# Patient Record
Sex: Male | Born: 1960
Health system: Southern US, Community
[De-identification: ages and names within clinical notes are randomized; demographics above are authoritative.]

## PROBLEM LIST (undated history)

## (undated) DIAGNOSIS — Z95810 Presence of automatic (implantable) cardiac defibrillator: Secondary | ICD-10-CM

## (undated) DIAGNOSIS — E785 Hyperlipidemia, unspecified: Secondary | ICD-10-CM

## (undated) DIAGNOSIS — I513 Intracardiac thrombosis, not elsewhere classified: Secondary | ICD-10-CM

## (undated) DIAGNOSIS — I5022 Chronic systolic (congestive) heart failure: Secondary | ICD-10-CM

## (undated) DIAGNOSIS — I255 Ischemic cardiomyopathy: Secondary | ICD-10-CM

## (undated) DIAGNOSIS — Z7901 Long term (current) use of anticoagulants: Secondary | ICD-10-CM

## (undated) DIAGNOSIS — R7301 Impaired fasting glucose: Secondary | ICD-10-CM

## (undated) DIAGNOSIS — K219 Gastro-esophageal reflux disease without esophagitis: Secondary | ICD-10-CM

## (undated) DIAGNOSIS — I10 Essential (primary) hypertension: Secondary | ICD-10-CM

## (undated) HISTORY — DX: Ischemic cardiomyopathy: I25.5

## (undated) HISTORY — DX: Impaired fasting glucose: R73.01

## (undated) HISTORY — DX: Long term (current) use of anticoagulants: Z79.01

## (undated) HISTORY — DX: Gastro-esophageal reflux disease without esophagitis: K21.9

## (undated) HISTORY — DX: Chronic systolic (congestive) heart failure: I50.22

## (undated) HISTORY — DX: Presence of automatic (implantable) cardiac defibrillator: Z95.810

## (undated) HISTORY — DX: Essential (primary) hypertension: I10

## (undated) HISTORY — DX: Intracardiac thrombosis, not elsewhere classified: I51.3

## (undated) HISTORY — DX: Hyperlipidemia, unspecified: E78.5

---

## 2011-03-01 HISTORY — PX: COLONOSCOPY: SHX174

## 2011-10-11 ENCOUNTER — Emergency Department (HOSPITAL_COMMUNITY): Payer: No Typology Code available for payment source

## 2011-10-11 ENCOUNTER — Emergency Department (HOSPITAL_COMMUNITY): Payer: Self-pay

## 2011-10-11 ENCOUNTER — Emergency Department (HOSPITAL_COMMUNITY)
Admission: EM | Admit: 2011-10-11 | Discharge: 2011-10-11 | Disposition: A | Discharge status: Home / Self Care | Payer: No Typology Code available for payment source | Attending: Emergency Medicine | Admitting: Emergency Medicine

## 2011-10-11 DIAGNOSIS — R079 Chest pain, unspecified: Secondary | ICD-10-CM | POA: Insufficient documentation

## 2011-10-11 DIAGNOSIS — Y92410 Unspecified street and highway as the place of occurrence of the external cause: Secondary | ICD-10-CM | POA: Insufficient documentation

## 2011-10-11 DIAGNOSIS — M542 Cervicalgia: Secondary | ICD-10-CM | POA: Insufficient documentation

## 2011-10-11 DIAGNOSIS — S199XXA Unspecified injury of neck, initial encounter: Secondary | ICD-10-CM

## 2011-10-11 DIAGNOSIS — T07XXXA Unspecified multiple injuries, initial encounter: Secondary | ICD-10-CM

## 2011-10-11 MED ORDER — OXYCODONE-ACETAMINOPHEN 5-325 MG PO TABS: 1.0000 | ORAL_TABLET | ORAL | Status: AC | PRN

## 2011-10-11 MED ORDER — OXYCODONE-ACETAMINOPHEN 5-325 MG PO TABS
1.0000 | ORAL_TABLET | Freq: Once | ORAL | Status: AC
  Administered 2011-10-11: 1 via ORAL
  Filled 2011-10-11: qty 1
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
## 2011-10-11 NOTE — ED Notes (Signed)
Pt ambulates with steady gait.  D/c instructions reviewed, verbalized understanding.  No respiratory distress noted.
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
## 2011-10-11 NOTE — ED Notes (Signed)
Pt requesting pain medication and removal of cervical collar.

## 2012-03-22 LAB — HM COLONOSCOPY

## 2013-02-28 DIAGNOSIS — I10 Essential (primary) hypertension: Secondary | ICD-10-CM

## 2013-02-28 HISTORY — DX: Essential (primary) hypertension: I10

## 2016-12-01 ENCOUNTER — Ambulatory Visit (INDEPENDENT_AMBULATORY_CARE_PROVIDER_SITE_OTHER): Payer: 59 | Admitting: Medical

## 2016-12-01 ENCOUNTER — Encounter: Payer: Self-pay | Admitting: Medical

## 2016-12-01 VITALS — BP 120/76 | HR 101 | Ht 68.0 in | Wt 210.0 lb

## 2016-12-01 DIAGNOSIS — R0602 Shortness of breath: Secondary | ICD-10-CM | POA: Diagnosis not present

## 2016-12-01 DIAGNOSIS — Z2821 Immunization not carried out because of patient refusal: Secondary | ICD-10-CM

## 2016-12-01 DIAGNOSIS — Z6831 Body mass index (BMI) 31.0-31.9, adult: Secondary | ICD-10-CM | POA: Insufficient documentation

## 2016-12-01 DIAGNOSIS — I10 Essential (primary) hypertension: Secondary | ICD-10-CM | POA: Diagnosis not present

## 2016-12-01 DIAGNOSIS — E669 Obesity, unspecified: Secondary | ICD-10-CM | POA: Insufficient documentation

## 2016-12-01 MED ORDER — LOSARTAN POTASSIUM-HCTZ 50-12.5 MG PO TABS: 1.0000 | ORAL_TABLET | Freq: Every day | ORAL | 1 refills | Status: DC

## 2016-12-01 NOTE — Progress Notes (Signed)
Subjective: Chief Complaint  Patient presents with  . New Patient (Initial Visit)    new pt  discuss his b/p    Was seeing a doctor in [place], [place] prior.  Got tired of driving all the way up there.  Lives here in [place] now.  Here to establish care.  Wife is Gaylin Bulthuis who comes here for care.   Been feeling good in general.    Works 6pm - 6am, [employer], is a Surveyor, mining for [employer].     He has hx/o HTN, diagnosed 3 years ago, taking Losartan HTC 50/12.5mg  daily.      Nonsmoker.  Drinks beer, 4 on a weekend.     Exercise - some, wife just signed them up at the gym.   Climbs in an out the truck daily.  Last lab, >74mo ago.  Last EKG was "great" 2 years.   No prior cardiology eval.    Checks BPs, gets 120/70s.  No chest pain,  no swelling.   Gets occasionally SOB.     Past Medical History:  Diagnosis Date  . Hypertension 2015   Current Outpatient Prescriptions on File Prior to Visit  Medication Sig Dispense Refill  . ibuprofen (ADVIL,MOTRIN) 200 MG tablet Take 400 mg by mouth every 6 (six) hours as needed. For pain    . Multiple Vitamin (MULTIVITAMIN WITH MINERALS) TABS Take 1 tablet by mouth daily.     No current facility-administered medications on file prior to visit.    ROS as in subjective   Objective: BP 120/76   Pulse (!) 101   Ht 5\' 8"  (1.727 m)   Wt 210 lb (95.3 kg)   SpO2 98%   BMI 31.93 kg/m   Wt Readings from Last 3 Encounters:  12/01/16 210 lb (95.3 kg)   BP Readings from Last 3 Encounters:  12/01/16 120/76  10/11/11 108/73   General appearance: alert, no distress, WD/WN, pleasant obese AA male Neck: supple, no lymphadenopathy, no thyromegaly, no masses, no bruits Heart: RRR, normal S1, S2, no murmurs Lungs: CTA bilaterally, no wheezes, rhonchi, or rales Abdomen: +bs, soft, non tender, non distended, no masses, no hepatomegaly, no splenomegaly Pulses: 2+ symmetric, upper and lower extremities, normal cap refill Ext: no  edema     Adult ECG Report  Indication: HTN  Rate: 98bpm  Rhythm: normal sinus rhythm and premature ventricular contractions (PVC)  QRS Axis: -81 degrees  PR Interval: 171ms  QRS Duration: 11ms  QTc: 494ms  Conduction Disturbances: left axis deviation  Other Abnormalities: none  Patient's cardiac risk factors are: advanced age (older than 80 for men, 25 for women) and hypertension.  EKG comparison: none  Narrative Interpretation: PVCs, abnormal EKG    Assessment: Encounter Diagnoses  Name Primary?  . Essential hypertension Yes  . Class 1 obesity with serious comorbidity and body mass index (BMI) of 31.0 to 31.9 in adult, unspecified obesity type   . SOB (shortness of breath)   . Influenza vaccine refused     Plan: Reviewed EKG, labs today fasting. C/t same medication for HTN, work on weight loss, healthy diet, routine exercise SOB - may be deconditioning.  reviewed EKG, will get labs.   Flu shot declined  Raymir was seen today for new patient (initial visit).  Diagnoses and all orders for this visit:  Essential hypertension -     Lipid panel -     Comprehensive metabolic panel -     CBC -  EKG 12-Lead  Class 1 obesity with serious comorbidity and body mass index (BMI) of 31.0 to 31.9 in adult, unspecified obesity type  SOB (shortness of breath)  Influenza vaccine refused

## 2016-12-02 ENCOUNTER — Other Ambulatory Visit: Payer: Self-pay | Admitting: Medical

## 2016-12-02 ENCOUNTER — Encounter: Payer: Self-pay | Admitting: Internal Medicine

## 2016-12-02 ENCOUNTER — Telehealth: Payer: Self-pay

## 2016-12-02 LAB — CBC
HEMATOCRIT: 51.2 % — AB (ref 38.5–50.0)
Hemoglobin: 17.4 g/dL — ABNORMAL HIGH (ref 13.2–17.1)
MCH: 28 pg (ref 27.0–33.0)
MCHC: 34 g/dL (ref 32.0–36.0)
MCV: 82.3 fL (ref 80.0–100.0)
MPV: 10.3 fL (ref 7.5–12.5)
PLATELETS: 330 10*3/uL (ref 140–400)
RBC: 6.22 10*6/uL — ABNORMAL HIGH (ref 4.20–5.80)
RDW: 13.6 % (ref 11.0–15.0)
WBC: 7.1 10*3/uL (ref 3.8–10.8)

## 2016-12-02 LAB — LIPID PANEL
CHOLESTEROL: 173 mg/dL (ref <200)
HDL: 38 mg/dL — ABNORMAL LOW (ref >40)
LDL Cholesterol (Calc): 107 mg/dL (calc) — ABNORMAL HIGH
Non-HDL Cholesterol (Calc): 135 mg/dL (calc) — ABNORMAL HIGH (ref <130)
TRIGLYCERIDES: 168 mg/dL — AB (ref <150)
Total CHOL/HDL Ratio: 4.6 (calc) (ref <5.0)

## 2016-12-02 LAB — COMPREHENSIVE METABOLIC PANEL
AG RATIO: 1.1 (calc) (ref 1.0–2.5)
ALBUMIN MSPROF: 4 g/dL (ref 3.6–5.1)
ALT: 34 U/L (ref 9–46)
AST: 25 U/L (ref 10–35)
Alkaline phosphatase (APISO): 51 U/L (ref 40–115)
BILIRUBIN TOTAL: 0.6 mg/dL (ref 0.2–1.2)
BUN: 18 mg/dL (ref 7–25)
CHLORIDE: 104 mmol/L (ref 98–110)
CO2: 27 mmol/L (ref 20–32)
CREATININE: 1.15 mg/dL (ref 0.70–1.33)
Calcium: 9.3 mg/dL (ref 8.6–10.3)
Globulin: 3.8 g/dL (calc) — ABNORMAL HIGH (ref 1.9–3.7)
Glucose, Bld: 113 mg/dL — ABNORMAL HIGH (ref 65–99)
POTASSIUM: 4.1 mmol/L (ref 3.5–5.3)
Sodium: 138 mmol/L (ref 135–146)
Total Protein: 7.8 g/dL (ref 6.1–8.1)

## 2016-12-02 MED ORDER — PRAVASTATIN SODIUM 20 MG PO TABS: 20.0000 mg | ORAL_TABLET | Freq: Every evening | ORAL | 0 refills | Status: DC

## 2016-12-02 NOTE — Telephone Encounter (Signed)
Records from [facility] placed in folder for review./ RLB

## 2017-03-06 ENCOUNTER — Encounter: Payer: Self-pay | Admitting: Medical

## 2017-03-06 ENCOUNTER — Ambulatory Visit: Payer: 59 | Admitting: Medical

## 2017-03-06 VITALS — BP 134/88 | HR 98 | Wt 212.6 lb

## 2017-03-06 DIAGNOSIS — Z125 Encounter for screening for malignant neoplasm of prostate: Secondary | ICD-10-CM

## 2017-03-06 DIAGNOSIS — Z1211 Encounter for screening for malignant neoplasm of colon: Secondary | ICD-10-CM | POA: Insufficient documentation

## 2017-03-06 DIAGNOSIS — R7989 Other specified abnormal findings of blood chemistry: Secondary | ICD-10-CM | POA: Insufficient documentation

## 2017-03-06 DIAGNOSIS — I10 Essential (primary) hypertension: Secondary | ICD-10-CM | POA: Diagnosis not present

## 2017-03-06 DIAGNOSIS — E785 Hyperlipidemia, unspecified: Secondary | ICD-10-CM | POA: Insufficient documentation

## 2017-03-06 DIAGNOSIS — Z1159 Encounter for screening for other viral diseases: Secondary | ICD-10-CM | POA: Insufficient documentation

## 2017-03-06 LAB — COMPREHENSIVE METABOLIC PANEL
AG Ratio: 1.1 (calc) (ref 1.0–2.5)
ALBUMIN MSPROF: 4 g/dL (ref 3.6–5.1)
ALKALINE PHOSPHATASE (APISO): 56 U/L (ref 40–115)
ALT: 34 U/L (ref 9–46)
AST: 21 U/L (ref 10–35)
BILIRUBIN TOTAL: 0.6 mg/dL (ref 0.2–1.2)
BUN: 13 mg/dL (ref 7–25)
CALCIUM: 9.3 mg/dL (ref 8.6–10.3)
CO2: 26 mmol/L (ref 20–32)
Chloride: 104 mmol/L (ref 98–110)
Creat: 1.15 mg/dL (ref 0.70–1.33)
Globulin: 3.7 g/dL (calc) (ref 1.9–3.7)
Glucose, Bld: 100 mg/dL — ABNORMAL HIGH (ref 65–99)
POTASSIUM: 4.2 mmol/L (ref 3.5–5.3)
SODIUM: 139 mmol/L (ref 135–146)
TOTAL PROTEIN: 7.7 g/dL (ref 6.1–8.1)

## 2017-03-06 LAB — CBC WITH DIFFERENTIAL/PLATELET
Basophils Absolute: 52 cells/uL (ref 0–200)
Basophils Relative: 0.8 %
EOS PCT: 3.7 %
Eosinophils Absolute: 241 cells/uL (ref 15–500)
HEMATOCRIT: 48.4 % (ref 38.5–50.0)
HEMOGLOBIN: 17.2 g/dL — AB (ref 13.2–17.1)
LYMPHS ABS: 2717 {cells}/uL (ref 850–3900)
MCH: 28.9 pg (ref 27.0–33.0)
MCHC: 35.5 g/dL (ref 32.0–36.0)
MCV: 81.2 fL (ref 80.0–100.0)
MPV: 10.1 fL (ref 7.5–12.5)
Monocytes Relative: 9.1 %
NEUTROS ABS: 2899 {cells}/uL (ref 1500–7800)
NEUTROS PCT: 44.6 %
Platelets: 288 10*3/uL (ref 140–400)
RBC: 5.96 10*6/uL — AB (ref 4.20–5.80)
RDW: 14.3 % (ref 11.0–15.0)
Total Lymphocyte: 41.8 %
WBC mixed population: 592 cells/uL (ref 200–950)
WBC: 6.5 10*3/uL (ref 3.8–10.8)

## 2017-03-06 LAB — LIPID PANEL
CHOL/HDL RATIO: 4.6 (calc) (ref <5.0)
Cholesterol: 143 mg/dL (ref <200)
HDL: 31 mg/dL — ABNORMAL LOW (ref >40)
LDL Cholesterol (Calc): 85 mg/dL (calc)
Non-HDL Cholesterol (Calc): 112 mg/dL (calc) (ref <130)
Triglycerides: 169 mg/dL — ABNORMAL HIGH (ref <150)

## 2017-03-06 NOTE — Progress Notes (Signed)
   Subjective:   Caleb Thomas is a 57 y.o. male presenting on 03/06/2017 with Follow-up (3 month follow up , no other concerns)  Hypertension-compliant with medication, no complaints.  No chest pain no shortness of breath no edema.  Hyperlipidemia-last visit we started Pravachol which she is taking without complaint.  Exercise 30 min daily.  Physical active on the job too.   Up and down all night long active.  Last prostate check was probably around a year ago.  No other complaint.  Review of Systems ROS as in subjective  Past Medical History:  Diagnosis Date  . Hyperlipidemia   . Hypertension 2015   Current Outpatient Medications on File Prior to Visit  Medication Sig Dispense Refill  . ibuprofen (ADVIL,MOTRIN) 200 MG tablet Take 400 mg by mouth every 6 (six) hours as needed. For pain    . losartan-hydrochlorothiazide (HYZAAR) 50-12.5 MG tablet Take 1 tablet by mouth daily. 90 tablet 1  . Multiple Vitamin (MULTIVITAMIN WITH MINERALS) TABS Take 1 tablet by mouth daily.    . pravastatin (PRAVACHOL) 20 MG tablet Take 1 tablet (20 mg total) by mouth every evening. 90 tablet 0   No current facility-administered medications on file prior to visit.    Family History  Problem Relation Age of Onset  . Stroke Mother   . Heart disease Father   . Hypertension Brother   . Stroke Brother   . Hypertension Brother   . Cancer Neg Hx     ROS as in subjective     Objective:    Objective: BP 134/88   Pulse 98   Wt 212 lb 9.6 oz (96.4 kg)   SpO2 98%   BMI 32.33 kg/m   General appearance: alert, no distress, WD/WN Neck: supple, no lymphadenopathy, no thyromegaly, no masses, no bruits Heart: RRR, normal S1, S2, no murmurs Lungs: CTA bilaterally, no wheezes, rhonchi, or rales Abdomen: +bs, soft, non tender, non distended, no masses, no hepatomegaly, no splenomegaly Pulses: 2+ symmetric, upper and lower extremities, normal cap refill Ext: no edema     Assessment: Encounter  Diagnoses  Name Primary?  . Essential hypertension Yes  . Hyperlipidemia, unspecified hyperlipidemia type   . Abnormal CBC   . Screen for colon cancer   . Screening for prostate cancer      Plan: HTN -continue same medication Hyperlipidemia-fasting labs today, continue Pravachol started last visit Abnormal CBC-repeat labs today from findings last visit I will have him do stool cards.  I reviewed his colonoscopy from 2014 We will plan to check prostate hopefully next visit, physical visit  Caleb Thomas was seen today for follow-up.  Diagnoses and all orders for this visit:  Essential hypertension -     Lipid panel -     Comprehensive metabolic panel  Hyperlipidemia, unspecified hyperlipidemia type -     Lipid panel -     Comprehensive metabolic panel  Abnormal CBC -     CBC with Differential/Platelet  Screen for colon cancer  Screening for prostate cancer   Return pending labs.

## 2017-03-07 ENCOUNTER — Other Ambulatory Visit: Payer: Self-pay | Admitting: Medical

## 2017-03-07 MED ORDER — PRAVASTATIN SODIUM 20 MG PO TABS: 20.0000 mg | ORAL_TABLET | Freq: Every evening | ORAL | 3 refills | Status: DC

## 2017-03-07 MED ORDER — LOSARTAN POTASSIUM-HCTZ 50-12.5 MG PO TABS: 1.0000 | ORAL_TABLET | Freq: Every day | ORAL | 1 refills | Status: DC

## 2017-03-13 ENCOUNTER — Other Ambulatory Visit (INDEPENDENT_AMBULATORY_CARE_PROVIDER_SITE_OTHER): Payer: 59

## 2017-03-13 DIAGNOSIS — Z0000 Encounter for general adult medical examination without abnormal findings: Secondary | ICD-10-CM

## 2017-03-13 LAB — POC HEMOCCULT BLD/STL (HOME/3-CARD/SCREEN)
Card #2 Fecal Occult Blod, POC: NEGATIVE
FECAL OCCULT BLD: NEGATIVE
FECAL OCCULT BLD: NEGATIVE

## 2017-04-24 ENCOUNTER — Telehealth: Payer: Self-pay | Admitting: Family Medicine

## 2017-04-24 NOTE — Telephone Encounter (Signed)
Pt called for "Generic" Cialis to be called to [facility] on [address].

## 2017-04-24 NOTE — Telephone Encounter (Signed)
I don't think we have discussed this, and I'm not aware that he has been on this kind of medication prior.  Please have him f/u to discuss

## 2017-04-25 NOTE — Telephone Encounter (Signed)
Called patient and advised same.  He will call back and schedule this along with his 4 mo cpe.

## 2017-05-31 ENCOUNTER — Telehealth: Payer: Self-pay | Admitting: Medical

## 2017-05-31 NOTE — Telephone Encounter (Signed)
Called patient and he advised that he had already contacted pharmacy and they advised him his medication was not part of the effected recall.

## 2017-05-31 NOTE — Telephone Encounter (Signed)
I received notice from their insurer or pharmacy notifying of a recall on certain lots of Losartan blood pressure medication   Have them call pharmacy to check to see if the Losartan medication is one on the recall list   Not all versions of Losartan affected.  Make appt to discuss if his is on the affected list

## 2017-07-20 ENCOUNTER — Ambulatory Visit (INDEPENDENT_AMBULATORY_CARE_PROVIDER_SITE_OTHER): Payer: 59 | Admitting: Medical

## 2017-07-20 ENCOUNTER — Encounter: Payer: Self-pay | Admitting: Medical

## 2017-07-20 VITALS — BP 130/82 | HR 95 | Temp 97.7°F | Ht 68.0 in | Wt 208.6 lb

## 2017-07-20 DIAGNOSIS — Z23 Encounter for immunization: Secondary | ICD-10-CM | POA: Insufficient documentation

## 2017-07-20 DIAGNOSIS — Z1159 Encounter for screening for other viral diseases: Secondary | ICD-10-CM

## 2017-07-20 DIAGNOSIS — I10 Essential (primary) hypertension: Secondary | ICD-10-CM | POA: Diagnosis not present

## 2017-07-20 DIAGNOSIS — Z6831 Body mass index (BMI) 31.0-31.9, adult: Secondary | ICD-10-CM

## 2017-07-20 DIAGNOSIS — N529 Male erectile dysfunction, unspecified: Secondary | ICD-10-CM | POA: Diagnosis not present

## 2017-07-20 DIAGNOSIS — Z125 Encounter for screening for malignant neoplasm of prostate: Secondary | ICD-10-CM

## 2017-07-20 DIAGNOSIS — Z0000 Encounter for general adult medical examination without abnormal findings: Secondary | ICD-10-CM | POA: Diagnosis not present

## 2017-07-20 DIAGNOSIS — E669 Obesity, unspecified: Secondary | ICD-10-CM | POA: Diagnosis not present

## 2017-07-20 DIAGNOSIS — Z1211 Encounter for screening for malignant neoplasm of colon: Secondary | ICD-10-CM

## 2017-07-20 DIAGNOSIS — E785 Hyperlipidemia, unspecified: Secondary | ICD-10-CM

## 2017-07-20 MED ORDER — SILDENAFIL CITRATE 100 MG PO TABS: 100.0000 mg | ORAL_TABLET | Freq: Every day | ORAL | 2 refills | Status: DC | PRN

## 2017-07-20 NOTE — Progress Notes (Signed)
Subjective:   HPI  Caleb Thomas is a 57 y.o. male who presents for Chief Complaint  Patient presents with  . Annual Exam    Medical care team includes: Tysinger, Camelia Eng, PA-C here for primary care Dentist Eye doctor  Concerns: Wants generic Viagra that insurance will pay for.  Has used this prior.   Wants to use 19m, 1/2 tablet prn.   Reviewed their medical, surgical, family, social, medication, and allergy history and updated chart as appropriate.  Past Medical History:  Diagnosis Date  . Hyperlipidemia   . Hypertension 2015    Social History   Socioeconomic History  . Marital status: Married    Spouse name: Not on file  . Number of children: Not on file  . Years of education: Not on file  . Highest education level: Not on file  Occupational History  . Not on file  Social Needs  . Financial resource strain: Not on file  . Food insecurity:    Worry: Not on file    Inability: Not on file  . Transportation needs:    Medical: Not on file    Non-medical: Not on file  Tobacco Use  . Smoking status: Former SResearch scientist (life sciences) . Smokeless tobacco: Never Used  . Tobacco comment: quit smoking 57years old ago  Substance and Sexual Activity  . Alcohol use: Yes    Alcohol/week: 1.8 oz    Types: 3 Cans of beer per week    Comment: on weekend   . Drug use: Not on file  . Sexual activity: Not on file  Lifestyle  . Physical activity:    Days per week: Not on file    Minutes per session: Not on file  . Stress: Not on file  Relationships  . Social connections:    Talks on phone: Not on file    Gets together: Not on file    Attends religious service: Not on file    Active member of club or organization: Not on file    Attends meetings of clubs or organizations: Not on file    Relationship status: Not on file  . Intimate partner violence:    Fear of current or ex partner: Not on file    Emotionally abused: Not on file    Physically abused: Not on file    Forced sexual  activity: Not on file  Other Topics Concern  . Not on file  Social History Narrative   Married, 10 children, 4 grandchildren.   SSurveyor, miningat [employer]   Exercise with walking on treadmill few days per week.   Member of [employer].    06/2017    Family History  Problem Relation Age of Onset  . Stroke Mother   . Heart disease Father   . Hypertension Brother   . Stroke Brother   . Hypertension Brother   . Cancer Neg Hx      Current Outpatient Medications:  .  losartan-hydrochlorothiazide (HYZAAR) 50-12.5 MG tablet, Take 1 tablet by mouth daily., Disp: 90 tablet, Rfl: 1 .  Multiple Vitamin (MULTIVITAMIN WITH MINERALS) TABS, Take 1 tablet by mouth daily., Disp: , Rfl:  .  pravastatin (PRAVACHOL) 20 MG tablet, Take 1 tablet (20 mg total) by mouth every evening., Disp: 90 tablet, Rfl: 3 .  ibuprofen (ADVIL,MOTRIN) 200 MG tablet, Take 400 mg by mouth every 6 (six) hours as needed. For pain, Disp: , Rfl:  .  sildenafil (VIAGRA) 100 MG tablet, Take 1 tablet (  100 mg total) by mouth daily as needed for erectile dysfunction., Disp: 6 tablet, Rfl: 2  No Known Allergies    Review of Systems Constitutional: -fever, -chills, -sweats, -unexpected weight change, -decreased appetite, -fatigue Allergy: -sneezing, -itching, -congestion Dermatology: -changing moles, --rash, -lumps ENT: -runny nose, -ear pain, -sore throat, -hoarseness, -sinus pain, -teeth pain, - ringing in ears, -hearing loss, -nosebleeds Cardiology: -chest pain, -palpitations, -swelling, -difficulty breathing when lying flat, -waking up short of breath Respiratory: -cough, -shortness of breath, -difficulty breathing with exercise or exertion, -wheezing, -coughing up blood Gastroenterology: -abdominal pain, -nausea, -vomiting, -diarrhea, -constipation, -blood in stool, -changes in bowel movement, -difficulty swallowing or eating Hematology: -bleeding, -bruising  Musculoskeletal: -joint aches, -muscle aches, -joint  swelling, -back pain, -neck pain, -cramping, -changes in gait Ophthalmology: denies vision changes, eye redness, itching, discharge Urology: -burning with urination, -difficulty urinating, -blood in urine, -urinary frequency, -urgency, -incontinence Neurology: -headache, -weakness, -tingling, -numbness, -memory loss, -falls, -dizziness Psychology: -depressed mood, -agitation, -sleep problems Male GU: no testicular mass, pain, no lymph nodes swollen, no swelling, no rash.     Objective:  BP 130/82   Pulse 95   Temp 97.7 F (36.5 C) (Oral)   Ht _0  (1.727 m)   Wt 208 lb 9.6 oz (94.6 kg)   SpO2 97%   BMI 31.72 kg/m   General appearance: alert, no distress, WD/WN, African American male Skin: several skin tags of neck bilat and temples bilat HEENT: normocephalic, conjunctiva/corneas normal, sclerae anicteric, PERRLA, EOMi, nares patent, no discharge or erythema, pharynx normal Oral cavity: MMM, tongue normal, edentulous Neck: supple, no lymphadenopathy, no thyromegaly, no masses, normal ROM, no bruits Chest: non tender, normal shape and expansion Heart: RRR, normal S1, S2, no murmurs Lungs: CTA bilaterally, no wheezes, rhonchi, or rales Abdomen: +bs, soft, non tender, non distended, no masses, no hepatomegaly, no splenomegaly, no bruits Back: non tender, normal ROM, no scoliosis Musculoskeletal: upper extremities non tender, no obvious deformity, normal ROM throughout, lower extremities non tender, no obvious deformity, normal ROM throughout Extremities: no edema, no cyanosis, no clubbing Pulses: 2+ symmetric, upper and lower extremities, normal cap refill Neurological: alert, oriented x 3, CN2-12 intact, strength normal upper extremities and lower extremities, sensation normal throughout, DTRs 2+ throughout, no cerebellar signs, gait normal Psychiatric: normal affect, behavior normal, pleasant  GU: normal male external genitalia,uncircumcised, hypogonadism on exam, nontender, no  masses, no hernia, no lymphadenopathy Rectal: anus normal tone, prostate WNL   Assessment and Plan :   Encounter Diagnoses  Name Primary?  . Encounter for health maintenance examination in adult Yes  . Essential hypertension   . Hyperlipidemia, unspecified hyperlipidemia type   . Screening for prostate cancer   . Screen for colon cancer   . Class 1 obesity with serious comorbidity and body mass index (BMI) of 31.0 to 31.9 in adult, unspecified obesity type   . Need for Tdap vaccination   . Erectile dysfunction, unspecified erectile dysfunction type   . Encounter for hepatitis C screening test for low risk patient     Physical exam - discussed and counseled on healthy lifestyle, diet, exercise, preventative care, vaccinations, sick and well care, proper use of emergency dept and after hours care, and addressed their concerns.    Health screening: See your eye doctor yearly for routine vision care. See your dentist yearly for routine dental care including hygiene visits twice yearly.  Cancer screening  Colonoscopy:  Given stool cards kit to return for hemoccult screening  Discussed PSA, prostate exam,  and prostate cancer screening risks/benefits.     Vaccinations: Advised yearly influenza vaccine  Counseled on the Tdap (tetanus, diptheria, and acellular pertussis) vaccine.  Vaccine information sheet given. Tdap vaccine given after consent obtained.  Erectile Dysfunction - Reviewed pathophysiology and differential diagnosis of erectile dysfunction with the patient.  Discussed treatment options.  Begin trial of Sildenafil generic.  Discussed potential risks of medications including hypotension and priapism.  Discussed proper use of medication.  Questions were answered.  Recheck with call back in 2 weeks.  Zahmir was seen today for annual exam.  Diagnoses and all orders for this visit:  Encounter for health maintenance examination in adult -     Comprehensive metabolic panel -      CBC -     PSA -     Hemoglobin A1c -     Microalbumin / creatinine urine ratio -     HIV antibody -     Hepatitis C antibody  Essential hypertension -     Microalbumin / creatinine urine ratio  Hyperlipidemia, unspecified hyperlipidemia type  Screening for prostate cancer -     PSA  Screen for colon cancer  Class 1 obesity with serious comorbidity and body mass index (BMI) of 31.0 to 31.9 in adult, unspecified obesity type  Need for Tdap vaccination  Erectile dysfunction, unspecified erectile dysfunction type  Encounter for hepatitis C screening test for low risk patient -     HIV antibody -     Hepatitis C antibody  Other orders -     sildenafil (VIAGRA) 100 MG tablet; Take 1 tablet (100 mg total) by mouth daily as needed for erectile dysfunction.    Follow-up pending labs, yearly for physical

## 2017-07-20 NOTE — Patient Instructions (Signed)
Thanks for trusting [place] with your health care and for coming in for a physical today.  Below are some general recommendations I have for you:  Yearly screenings See your eye doctor yearly for routine vision care. See your dentist yearly for routine dental care including hygiene visits twice yearly. See me here yearly for a routine physical and preventative care visit   Specific Concerns today:  . Please return the 3 stool cards so we can check for blood.   This is an intermediate colon cancer screen . Continue your medications as usual . Let me know how the generic Viagra works for you   Please follow up yearly for a physical.   Preventative Care for Adults - Male      [place]:  A routine yearly physical is a good way to check in with your primary care provider about your health and preventive screening. It is also an opportunity to share updates about your health and any concerns you have, and receive a thorough all-over exam.   Most health insurance companies pay for at least some preventative services.  Check with your health plan for specific coverages.  WHAT PREVENTATIVE SERVICES DO WOMEN NEED?  Adult men should have their weight and blood pressure checked regularly.   Men age 50 and older should have their cholesterol levels checked regularly.  Beginning at age 54 and continuing to age 18, men should be screened for colorectal cancer.  Certain people may need continued testing until age 31.  Updating vaccinations is part of preventative care.  Vaccinations help protect against diseases such as the flu.  Osteoporosis is a disease in which the bones lose minerals and strength as we age. Men ages 3 and over should discuss this with their caregivers  Lab tests are generally done as part of preventative care to screen for anemia and blood disorders, to screen for problems with the kidneys and liver, to screen for bladder problems, to check blood sugar,  and to check your cholesterol level.  Preventative services generally include counseling about diet, exercise, avoiding tobacco, drugs, excessive alcohol consumption, and sexually transmitted infections.    GENERAL RECOMMENDATIONS FOR GOOD HEALTH:  Healthy diet:  Eat a variety of foods, including fruit, vegetables, animal or vegetable protein, such as meat, fish, chicken, and eggs, or beans, lentils, tofu, and grains, such as rice.  Drink plenty of water daily.  Decrease saturated fat in the diet, avoid lots of red meat, processed foods, sweets, fast foods, and fried foods.  Exercise:  Aerobic exercise helps maintain good heart health. At least 30-40 minutes of moderate-intensity exercise is recommended. For example, a brisk walk that increases your heart rate and breathing. This should be done on most days of the week.   Find a type of exercise or a variety of exercises that you enjoy so that it becomes a part of your daily life.  Examples are running, walking, swimming, water aerobics, and biking.  For motivation and support, explore group exercise such as aerobic class, spin class, Zumba, Yoga,or  martial arts, etc.    Set exercise goals for yourself, such as a certain weight goal, walk or run in a race such as a 5k walk/run.  Speak to your primary care provider about exercise goals.  Disease prevention:  If you smoke or chew tobacco, find out from your caregiver how to quit. It can literally save your life, no matter how long you have been a tobacco user. If  you do not use tobacco, never begin.   Maintain a healthy diet and normal weight. Increased weight leads to problems with blood pressure and diabetes.   The Body Mass Index or BMI is a way of measuring how much of your body is fat. Having a BMI above 27 increases the risk of heart disease, diabetes, hypertension, stroke and other problems related to obesity. Your caregiver can help determine your BMI and based on it develop an  exercise and dietary program to help you achieve or maintain this important measurement at a healthful level.  High blood pressure causes heart and blood vessel problems.  Persistent high blood pressure should be treated with medicine if weight loss and exercise do not work.   Fat and cholesterol leaves deposits in your arteries that can block them. This causes heart disease and vessel disease elsewhere in your body.  If your cholesterol is found to be high, or if you have heart disease or certain other medical conditions, then you may need to have your cholesterol monitored frequently and be treated with medication.   Ask if you should have a cardiac stress test if your history suggests this. A stress test is a test done on a treadmill that looks for heart disease. This test can find disease prior to there being a problem.  Osteoporosis is a disease in which the bones lose minerals and strength as we age. This can result in serious bone fractures. Risk of osteoporosis can be identified using a bone density scan. Men ages 7 and over should discuss this with their caregivers. Ask your caregiver whether you should be taking a calcium supplement and Vitamin D, to reduce the rate of osteoporosis.   Avoid drinking alcohol in excess (more than two drinks per day).  Avoid use of street drugs. Do not share needles with anyone. Ask for professional help if you need assistance or instructions on stopping the use of alcohol, cigarettes, and/or drugs.  Brush your teeth twice a day with fluoride toothpaste, and floss once a day. Good oral hygiene prevents tooth decay and gum disease. The problems can be painful, unattractive, and can cause other health problems. Visit your dentist for a routine oral and dental check up and preventive care every 6-12 months.   Look at your skin regularly.  Use a mirror to look at your back. Notify your caregivers of changes in moles, especially if there are changes in shapes,  colors, a size larger than a pencil eraser, an irregular border, or development of new moles.  Safety:  Use seatbelts 100% of the time, whether driving or as a passenger.  Use safety devices such as hearing protection if you work in environments with loud noise or significant background noise.  Use safety glasses when doing any work that could send debris in to the eyes.  Use a helmet if you ride a bike or motorcycle.  Use appropriate safety gear for contact sports.  Talk to your caregiver about gun safety.  Use sunscreen with a SPF (or skin protection factor) of 15 or greater.  Lighter skinned people are at a greater risk of skin cancer. Don't forget to also wear sunglasses in order to protect your eyes from too much damaging sunlight. Damaging sunlight can accelerate cataract formation.   Practice safe sex. Use condoms. Condoms are used for birth control and to help reduce the spread of sexually transmitted infections (or STIs).  Some of the STIs are gonorrhea (the clap), chlamydia, syphilis, trichomonas,  herpes, HPV (human papilloma virus) and HIV (human immunodeficiency virus) which causes AIDS. The herpes, HIV and HPV are viral illnesses that have no cure. These can result in disability, cancer and death.   Keep carbon monoxide and smoke detectors in your home functioning at all times. Change the batteries every 6 months or use a model that plugs into the wall.   Vaccinations:  Stay up to date with your tetanus shots and other required immunizations. You should have a booster for tetanus every 10 years. Be sure to get your flu shot every year, since 5%-20% of the [place] population comes down with the flu. The flu vaccine changes each year, so being vaccinated once is not enough. Get your shot in the fall, before the flu season peaks.   Other vaccines to consider:  Human Papilloma Virus or HPV causes cancer of the cervix, and other infections that can be transmitted from person to person. There is  a vaccine for HPV, and males should get immunized between the ages of 79 and 15. It requires a series of 3 shots.   Pneumococcal vaccine to protect against certain types of pneumonia.  This is normally recommended for adults age 21 or older.  However, adults younger than 59 years old with certain underlying conditions such as diabetes, heart or lung disease should also receive the vaccine.  Shingles vaccine to protect against Varicella Zoster if you are older than age 73, or younger than 59 years old with certain underlying illness.  If you have not had the Shingrix vaccine, please call your insurer to inquire about coverage for the Shingrix vaccine given in 2 doses.   Some insurers cover this vaccine after age 5, some cover this after age 23.  If your insurer covers this, then call to schedule appointment to have this vaccine here  Hepatitis A vaccine to protect against a form of infection of the liver by a virus acquired from food.  Hepatitis B vaccine to protect against a form of infection of the liver by a virus acquired from blood or body fluids, particularly if you work in health care.  If you plan to travel internationally, check with your local health department for specific vaccination recommendations.   What should I know about cancer screening? Many types of cancers can be detected early and may often be prevented. Lung Cancer  You should be screened every year for lung cancer if: ? You are a current smoker who has smoked for at least 30 years. ? You are a former smoker who has quit within the past 15 years.  Talk to your health care provider about your screening options, when you should start screening, and how often you should be screened.  Colorectal Cancer  Routine colorectal cancer screening usually begins at 59 years of age and should be repeated every 5-10 years until you are 58 years old. You may need to be screened more often if early forms of precancerous polyps or  small growths are found. Your health care provider may recommend screening at an earlier age if you have risk factors for colon cancer.  Your health care provider may recommend using home test kits to check for hidden blood in the stool.  A small camera at the end of a tube can be used to examine your colon (sigmoidoscopy or colonoscopy). This checks for the earliest forms of colorectal cancer.  Prostate and Testicular Cancer  Depending on your age and overall health, your health care provider  may do certain tests to screen for prostate and testicular cancer.  Talk to your health care provider about any symptoms or concerns you have about testicular or prostate cancer.  Skin Cancer  Check your skin from head to toe regularly.  Tell your health care provider about any new moles or changes in moles, especially if: ? There is a change in a mole's size, shape, or color. ? You have a mole that is larger than a pencil eraser.  Always use sunscreen. Apply sunscreen liberally and repeat throughout the day.  Protect yourself by wearing long sleeves, pants, a wide-brimmed hat, and sunglasses when outside.

## 2017-07-21 ENCOUNTER — Other Ambulatory Visit: Payer: Self-pay | Admitting: Medical

## 2017-07-21 LAB — COMPREHENSIVE METABOLIC PANEL
ALBUMIN: 4 g/dL (ref 3.5–5.5)
ALT: 37 IU/L (ref 0–44)
AST: 26 IU/L (ref 0–40)
Albumin/Globulin Ratio: 1.1 — ABNORMAL LOW (ref 1.2–2.2)
Alkaline Phosphatase: 54 IU/L (ref 39–117)
BILIRUBIN TOTAL: 0.6 mg/dL (ref 0.0–1.2)
BUN / CREAT RATIO: 14 (ref 9–20)
BUN: 15 mg/dL (ref 6–24)
CALCIUM: 9.5 mg/dL (ref 8.7–10.2)
CHLORIDE: 104 mmol/L (ref 96–106)
CO2: 22 mmol/L (ref 20–29)
CREATININE: 1.07 mg/dL (ref 0.76–1.27)
GFR, EST AFRICAN AMERICAN: 87 mL/min/{1.73_m2} (ref >59)
GFR, EST NON AFRICAN AMERICAN: 76 mL/min/{1.73_m2} (ref >59)
GLUCOSE: 108 mg/dL — AB (ref 65–99)
Globulin, Total: 3.7 g/dL (ref 1.5–4.5)
Potassium: 4 mmol/L (ref 3.5–5.2)
Sodium: 139 mmol/L (ref 134–144)
TOTAL PROTEIN: 7.7 g/dL (ref 6.0–8.5)

## 2017-07-21 LAB — MICROALBUMIN / CREATININE URINE RATIO
Creatinine, Urine: 131.4 mg/dL
Microalb/Creat Ratio: 104.9 mg/g creat — ABNORMAL HIGH (ref 0.0–30.0)
Microalbumin, Urine: 137.9 ug/mL

## 2017-07-21 LAB — CBC
HEMATOCRIT: 48.2 % (ref 37.5–51.0)
HEMOGLOBIN: 17.3 g/dL (ref 13.0–17.7)
MCH: 29 pg (ref 26.6–33.0)
MCHC: 35.9 g/dL — ABNORMAL HIGH (ref 31.5–35.7)
MCV: 81 fL (ref 79–97)
PLATELETS: 268 10*3/uL (ref 150–450)
RBC: 5.96 x10E6/uL — ABNORMAL HIGH (ref 4.14–5.80)
RDW: 14.1 % (ref 12.3–15.4)
WBC: 5.3 10*3/uL (ref 3.4–10.8)

## 2017-07-21 LAB — HEMOGLOBIN A1C
Est. average glucose Bld gHb Est-mCnc: 123 mg/dL
Hgb A1c MFr Bld: 5.9 % — ABNORMAL HIGH (ref 4.8–5.6)

## 2017-07-21 LAB — HEPATITIS C ANTIBODY

## 2017-07-21 LAB — HIV ANTIBODY (ROUTINE TESTING W REFLEX): HIV Screen 4th Generation wRfx: NONREACTIVE

## 2017-07-21 LAB — PSA: PROSTATE SPECIFIC AG, SERUM: 1 ng/mL (ref 0.0–4.0)

## 2017-07-21 MED ORDER — PRAVASTATIN SODIUM 20 MG PO TABS: 20.0000 mg | ORAL_TABLET | Freq: Every evening | ORAL | 3 refills | Status: DC

## 2017-07-21 MED ORDER — LOSARTAN POTASSIUM-HCTZ 50-12.5 MG PO TABS: 1.0000 | ORAL_TABLET | Freq: Every day | ORAL | 3 refills | Status: DC

## 2017-07-26 ENCOUNTER — Other Ambulatory Visit (INDEPENDENT_AMBULATORY_CARE_PROVIDER_SITE_OTHER): Payer: 59

## 2017-07-26 DIAGNOSIS — Z0000 Encounter for general adult medical examination without abnormal findings: Secondary | ICD-10-CM

## 2017-07-26 LAB — POC HEMOCCULT BLD/STL (HOME/3-CARD/SCREEN)
Card #2 Fecal Occult Blod, POC: POSITIVE
FECAL OCCULT BLD: NEGATIVE
FECAL OCCULT BLD: NEGATIVE

## 2017-08-15 ENCOUNTER — Other Ambulatory Visit: Payer: Self-pay | Admitting: Medical

## 2017-08-15 NOTE — Telephone Encounter (Signed)
lmom asking patient to call back and let us know if he wants his medication can be sent to a different pharmacy.

## 2017-08-15 NOTE — Telephone Encounter (Signed)
See if he can try a different pharmacy instead of switching medications.   If agreeable send somewhere else

## 2017-08-15 NOTE — Telephone Encounter (Signed)
This is on back order,  Can you change to different RX?  Thank you

## 2017-08-16 ENCOUNTER — Other Ambulatory Visit: Payer: Self-pay | Admitting: Medical

## 2017-08-16 ENCOUNTER — Telehealth: Payer: Self-pay

## 2017-08-16 MED ORDER — HYDROCHLOROTHIAZIDE 12.5 MG PO TABS: 12.5000 mg | ORAL_TABLET | Freq: Every day | ORAL | 0 refills | Status: DC

## 2017-08-16 MED ORDER — LOSARTAN POTASSIUM 50 MG PO TABS: ORAL_TABLET | ORAL | 0 refills | Status: DC

## 2017-08-16 NOTE — Telephone Encounter (Signed)
Done

## 2017-08-16 NOTE — Telephone Encounter (Signed)
Pharmacy stated they will need a separate prescription sent for Hydrochlorothiazide. Please advise.   FYI they had losartan and hydrochlorothiazide (but not in 1 pill form)

## 2017-08-16 NOTE — Telephone Encounter (Signed)
Pt stated he would contact [facility] to see how long medication would be on back order and he will call back to inform us.

## 2017-09-16 ENCOUNTER — Other Ambulatory Visit: Payer: Self-pay | Admitting: Medical

## 2017-11-03 ENCOUNTER — Other Ambulatory Visit: Payer: Self-pay | Admitting: Medical

## 2017-11-03 NOTE — Telephone Encounter (Signed)
Is this ok to refill?  

## 2017-11-10 ENCOUNTER — Other Ambulatory Visit: Payer: Self-pay | Admitting: Medical

## 2017-11-13 ENCOUNTER — Telehealth: Payer: Self-pay

## 2017-11-13 MED ORDER — LOSARTAN POTASSIUM 50 MG PO TABS: ORAL_TABLET | ORAL | 6 refills | Status: DC

## 2017-11-13 NOTE — Telephone Encounter (Signed)
[facility] pharmacy sent fax stating losartan was on back order. [facility] has it in stock and patient would like it to be sent there.   This is done.

## 2018-02-05 ENCOUNTER — Other Ambulatory Visit: Payer: Self-pay | Admitting: Medical

## 2018-03-09 ENCOUNTER — Encounter: Payer: Self-pay | Admitting: Medical

## 2018-03-09 ENCOUNTER — Other Ambulatory Visit: Payer: Self-pay | Admitting: Medical

## 2018-03-09 ENCOUNTER — Ambulatory Visit: Payer: 59 | Admitting: Medical

## 2018-03-09 VITALS — BP 134/80 | HR 68 | Temp 98.3°F | Resp 16 | Ht 68.0 in | Wt 210.0 lb

## 2018-03-09 DIAGNOSIS — E785 Hyperlipidemia, unspecified: Secondary | ICD-10-CM

## 2018-03-09 DIAGNOSIS — I10 Essential (primary) hypertension: Secondary | ICD-10-CM | POA: Diagnosis not present

## 2018-03-09 DIAGNOSIS — R7301 Impaired fasting glucose: Secondary | ICD-10-CM

## 2018-03-09 DIAGNOSIS — R809 Proteinuria, unspecified: Secondary | ICD-10-CM | POA: Diagnosis not present

## 2018-03-09 NOTE — Progress Notes (Signed)
Subjective: Chief Complaint  Patient presents with  . follow up    follwo up htn, chol   Here for med check  HTN - compliant with losartan 50mg  and HCT 12.5 mg daily.  Checks BPs and numbers at home are WNL.  Denies chest pain, dyspnea, SOB, edema  Hyperlipidemia - compliant with pravastatin 20mg  daily.     ED - uses Viagra prn.  Sometimes gets lower abdominal crampy pain after using Viagra.  Uses Viagra 1/2 tablet daily  No other c/o   Past Medical History:  Diagnosis Date  . Hyperlipidemia   . Hypertension 2015   Current Outpatient Medications on File Prior to Visit  Medication Sig Dispense Refill  . hydrochlorothiazide (HYDRODIURIL) 12.5 MG tablet TAKE 1 TABLET BY MOUTH EVERY DAY WITH LOSARTAN 30 tablet 2  . ibuprofen (ADVIL,MOTRIN) 200 MG tablet Take 400 mg by mouth every 6 (six) hours as needed. For pain    . losartan (COZAAR) 50 MG tablet TAKE 1 TABLET BY MOUTH EVERY DAY 30 tablet 6  . Multiple Vitamin (MULTIVITAMIN WITH MINERALS) TABS Take 1 tablet by mouth daily.    . pravastatin (PRAVACHOL) 20 MG tablet Take 1 tablet (20 mg total) by mouth every evening. 90 tablet 3  . sildenafil (VIAGRA) 100 MG tablet TAKE 1 TABLET BY MOUTH EVERY DAY AS NEEDED 6 tablet 2   No current facility-administered medications on file prior to visit.    ROS as in subjective    Objective: BP 134/80   Pulse 68   Temp 98.3 F (36.8 C) (Oral)   Resp 16   Ht 5\' 8"  (1.727 m)   Wt 210 lb (95.3 kg)   SpO2 98%   BMI 31.93 kg/m   Wt Readings from Last 3 Encounters:  03/09/18 210 lb (95.3 kg)  07/20/17 208 lb 9.6 oz (94.6 kg)  03/06/17 212 lb 9.6 oz (96.4 kg)   General appearance: alert, no distress, WD/WN,  Neck: supple, no lymphadenopathy, no thyromegaly, no masses, no bruits Heart: RRR, normal S1, S2, no murmurs Lungs: CTA bilaterally, no wheezes, rhonchi, or rales Abdomen: +bs, soft, non tender, non distended, no masses, no hepatomegaly, no splenomegaly, no bruits Pulses: 2+  symmetric, upper and lower extremities, normal cap refill Ext: no edema   Assessment: Encounter Diagnoses  Name Primary?  . Essential hypertension Yes  . Hyperlipidemia, unspecified hyperlipidemia type   . Microalbuminuria   . Impaired fasting blood sugar     Plan: Continue current medications, labs today. I had him come back in today as his micro albumin level was elevated last visit in May 2019 BP ok today, c/t efforts with regular exercise, healthy diet.  Caleb Thomas was seen today for follow up.  Diagnoses and all orders for this visit:  Essential hypertension -     Comprehensive metabolic panel  Hyperlipidemia, unspecified hyperlipidemia type -     Lipid panel -     Comprehensive metabolic panel  Microalbuminuria -     Comprehensive metabolic panel  Impaired fasting blood sugar -     Hemoglobin A1c

## 2018-03-09 NOTE — Patient Instructions (Signed)
Check insurance to see what the preferred medication is for erectile dysfunction  Choices include: Viagra Cialis Levitra Stendra Stayxn  Generic Sildenafil

## 2018-03-10 LAB — COMPREHENSIVE METABOLIC PANEL
A/G RATIO: 1.1 — AB (ref 1.2–2.2)
ALBUMIN: 4 g/dL (ref 3.5–5.5)
ALK PHOS: 60 IU/L (ref 39–117)
ALT: 38 IU/L (ref 0–44)
AST: 26 IU/L (ref 0–40)
BUN / CREAT RATIO: 13 (ref 9–20)
BUN: 15 mg/dL (ref 6–24)
Bilirubin Total: 0.5 mg/dL (ref 0.0–1.2)
CO2: 21 mmol/L (ref 20–29)
CREATININE: 1.18 mg/dL (ref 0.76–1.27)
Calcium: 9.3 mg/dL (ref 8.7–10.2)
Chloride: 103 mmol/L (ref 96–106)
GFR calc Af Amer: 78 mL/min/{1.73_m2} (ref >59)
GFR calc non Af Amer: 67 mL/min/{1.73_m2} (ref >59)
GLOBULIN, TOTAL: 3.6 g/dL (ref 1.5–4.5)
Glucose: 96 mg/dL (ref 65–99)
Potassium: 4.2 mmol/L (ref 3.5–5.2)
Sodium: 140 mmol/L (ref 134–144)
Total Protein: 7.6 g/dL (ref 6.0–8.5)

## 2018-03-10 LAB — LIPID PANEL
CHOLESTEROL TOTAL: 157 mg/dL (ref 100–199)
Chol/HDL Ratio: 4.5 ratio (ref 0.0–5.0)
HDL: 35 mg/dL — AB (ref >39)
LDL CALC: 82 mg/dL (ref 0–99)
TRIGLYCERIDES: 199 mg/dL — AB (ref 0–149)
VLDL CHOLESTEROL CAL: 40 mg/dL (ref 5–40)

## 2018-03-10 LAB — HEMOGLOBIN A1C
Est. average glucose Bld gHb Est-mCnc: 123 mg/dL
Hgb A1c MFr Bld: 5.9 % — ABNORMAL HIGH (ref 4.8–5.6)

## 2018-03-13 ENCOUNTER — Other Ambulatory Visit: Payer: Self-pay

## 2018-03-13 DIAGNOSIS — E785 Hyperlipidemia, unspecified: Secondary | ICD-10-CM

## 2018-03-13 DIAGNOSIS — I10 Essential (primary) hypertension: Secondary | ICD-10-CM

## 2018-03-13 MED ORDER — PRAVASTATIN SODIUM 20 MG PO TABS: 20.0000 mg | ORAL_TABLET | Freq: Every evening | ORAL | 3 refills | Status: DC

## 2018-03-13 MED ORDER — LOSARTAN POTASSIUM 50 MG PO TABS
ORAL_TABLET | ORAL | 3 refills | Status: DC
Start: 2018-03-13 — End: 2018-05-14

## 2018-03-13 MED ORDER — HYDROCHLOROTHIAZIDE 12.5 MG PO TABS: ORAL_TABLET | ORAL | 3 refills | Status: DC

## 2018-04-29 DIAGNOSIS — I2510 Atherosclerotic heart disease of native coronary artery without angina pectoris: Secondary | ICD-10-CM

## 2018-04-29 HISTORY — DX: Atherosclerotic heart disease of native coronary artery without angina pectoris: I25.10

## 2018-05-06 ENCOUNTER — Encounter (HOSPITAL_COMMUNITY)
Admission: EM | Disposition: A | Discharge status: Home / Self Care | Payer: Self-pay | Source: Home / Self Care | Attending: Internal Medicine

## 2018-05-06 ENCOUNTER — Inpatient Hospital Stay (HOSPITAL_COMMUNITY)
Admission: EM | Admit: 2018-05-06 | Discharge: 2018-05-14 | DRG: 281 | Disposition: A | Discharge status: Home / Self Care | Payer: 59 | Attending: Internal Medicine | Admitting: Internal Medicine

## 2018-05-06 ENCOUNTER — Other Ambulatory Visit: Payer: Self-pay

## 2018-05-06 ENCOUNTER — Encounter (HOSPITAL_COMMUNITY): Payer: Self-pay

## 2018-05-06 ENCOUNTER — Emergency Department (HOSPITAL_COMMUNITY): Payer: 59

## 2018-05-06 DIAGNOSIS — R778 Other specified abnormalities of plasma proteins: Secondary | ICD-10-CM

## 2018-05-06 DIAGNOSIS — I2129 ST elevation (STEMI) myocardial infarction involving other sites: Secondary | ICD-10-CM | POA: Diagnosis not present

## 2018-05-06 DIAGNOSIS — Z87891 Personal history of nicotine dependence: Secondary | ICD-10-CM | POA: Diagnosis not present

## 2018-05-06 DIAGNOSIS — I2510 Atherosclerotic heart disease of native coronary artery without angina pectoris: Secondary | ICD-10-CM

## 2018-05-06 DIAGNOSIS — I10 Essential (primary) hypertension: Secondary | ICD-10-CM

## 2018-05-06 DIAGNOSIS — Z823 Family history of stroke: Secondary | ICD-10-CM | POA: Diagnosis not present

## 2018-05-06 DIAGNOSIS — I2102 ST elevation (STEMI) myocardial infarction involving left anterior descending coronary artery: Secondary | ICD-10-CM | POA: Diagnosis not present

## 2018-05-06 DIAGNOSIS — E785 Hyperlipidemia, unspecified: Secondary | ICD-10-CM | POA: Diagnosis not present

## 2018-05-06 DIAGNOSIS — Z8249 Family history of ischemic heart disease and other diseases of the circulatory system: Secondary | ICD-10-CM | POA: Diagnosis not present

## 2018-05-06 DIAGNOSIS — I519 Heart disease, unspecified: Secondary | ICD-10-CM | POA: Diagnosis not present

## 2018-05-06 DIAGNOSIS — I213 ST elevation (STEMI) myocardial infarction of unspecified site: Secondary | ICD-10-CM

## 2018-05-06 DIAGNOSIS — R079 Chest pain, unspecified: Secondary | ICD-10-CM | POA: Diagnosis not present

## 2018-05-06 DIAGNOSIS — I255 Ischemic cardiomyopathy: Secondary | ICD-10-CM | POA: Diagnosis not present

## 2018-05-06 DIAGNOSIS — I236 Thrombosis of atrium, auricular appendage, and ventricle as current complications following acute myocardial infarction: Secondary | ICD-10-CM | POA: Diagnosis not present

## 2018-05-06 DIAGNOSIS — N179 Acute kidney failure, unspecified: Secondary | ICD-10-CM | POA: Diagnosis not present

## 2018-05-06 DIAGNOSIS — E7800 Pure hypercholesterolemia, unspecified: Secondary | ICD-10-CM | POA: Diagnosis not present

## 2018-05-06 DIAGNOSIS — R7989 Other specified abnormal findings of blood chemistry: Secondary | ICD-10-CM | POA: Diagnosis not present

## 2018-05-06 DIAGNOSIS — I472 Ventricular tachycardia: Secondary | ICD-10-CM | POA: Diagnosis not present

## 2018-05-06 DIAGNOSIS — Z79899 Other long term (current) drug therapy: Secondary | ICD-10-CM | POA: Diagnosis not present

## 2018-05-06 DIAGNOSIS — I420 Dilated cardiomyopathy: Secondary | ICD-10-CM | POA: Diagnosis not present

## 2018-05-06 DIAGNOSIS — I513 Intracardiac thrombosis, not elsewhere classified: Secondary | ICD-10-CM | POA: Diagnosis not present

## 2018-05-06 DIAGNOSIS — R0602 Shortness of breath: Secondary | ICD-10-CM | POA: Diagnosis not present

## 2018-05-06 HISTORY — PX: LEFT HEART CATH AND CORONARY ANGIOGRAPHY: CATH118249

## 2018-05-06 LAB — I-STAT TROPONIN, ED: Troponin i, poc: 16.81 ng/mL (ref 0.00–0.08)

## 2018-05-06 LAB — BASIC METABOLIC PANEL
Anion gap: 11 (ref 5–15)
BUN: 43 mg/dL — AB (ref 6–20)
CO2: 24 mmol/L (ref 22–32)
Calcium: 8.1 mg/dL — ABNORMAL LOW (ref 8.9–10.3)
Chloride: 97 mmol/L — ABNORMAL LOW (ref 98–111)
Creatinine, Ser: 1.84 mg/dL — ABNORMAL HIGH (ref 0.61–1.24)
GFR calc Af Amer: 45 mL/min — ABNORMAL LOW (ref >60)
GFR calc non Af Amer: 39 mL/min — ABNORMAL LOW (ref >60)
GLUCOSE: 121 mg/dL — AB (ref 70–99)
Potassium: 3 mmol/L — ABNORMAL LOW (ref 3.5–5.1)
Sodium: 132 mmol/L — ABNORMAL LOW (ref 135–145)

## 2018-05-06 LAB — SAMPLE TO BLOOD BANK

## 2018-05-06 LAB — CBC
HEMATOCRIT: 44.4 % (ref 39.0–52.0)
Hemoglobin: 15.9 g/dL (ref 13.0–17.0)
MCH: 27.9 pg (ref 26.0–34.0)
MCHC: 35.8 g/dL (ref 30.0–36.0)
MCV: 77.9 fL — ABNORMAL LOW (ref 80.0–100.0)
Platelets: 274 10*3/uL (ref 150–400)
RBC: 5.7 MIL/uL (ref 4.22–5.81)
RDW: 12.8 % (ref 11.5–15.5)
WBC: 14.1 10*3/uL — ABNORMAL HIGH (ref 4.0–10.5)
nRBC: 0 % (ref 0.0–0.2)

## 2018-05-06 LAB — LIPID PANEL
CHOLESTEROL: 142 mg/dL (ref 0–200)
HDL: 17 mg/dL — ABNORMAL LOW (ref >40)
LDL Cholesterol: 82 mg/dL (ref 0–99)
Total CHOL/HDL Ratio: 8.4 RATIO
Triglycerides: 213 mg/dL — ABNORMAL HIGH (ref <150)
VLDL: 43 mg/dL — ABNORMAL HIGH (ref 0–40)

## 2018-05-06 LAB — APTT: aPTT: 31 seconds (ref 24–36)

## 2018-05-06 LAB — PROTIME-INR
INR: 1.2 (ref 0.8–1.2)
Prothrombin Time: 15.2 seconds (ref 11.4–15.2)

## 2018-05-06 SURGERY — LEFT HEART CATH AND CORONARY ANGIOGRAPHY
Anesthesia: LOCAL

## 2018-05-06 MED ORDER — HEPARIN (PORCINE) IN NACL 1000-0.9 UT/500ML-% IV SOLN
INTRAVENOUS | Status: AC
  Filled 2018-05-06: qty 1000

## 2018-05-06 MED ORDER — SODIUM CHLORIDE 0.9 % IV SOLN: 250.0000 mL | INTRAVENOUS | Status: DC | PRN

## 2018-05-06 MED ORDER — HEPARIN SODIUM (PORCINE) 1000 UNIT/ML IJ SOLN
INTRAMUSCULAR | Status: AC
  Filled 2018-05-06: qty 1

## 2018-05-06 MED ORDER — HEPARIN (PORCINE) 25000 UT/250ML-% IV SOLN
1000.0000 [IU]/h | INTRAVENOUS | Status: DC
  Administered 2018-05-06: 1000 [IU]/h via INTRAVENOUS
  Filled 2018-05-06: qty 250

## 2018-05-06 MED ORDER — CLOPIDOGREL BISULFATE 75 MG PO TABS: 75.0000 mg | ORAL_TABLET | Freq: Every day | ORAL | Status: DC

## 2018-05-06 MED ORDER — ONDANSETRON HCL 4 MG/2ML IJ SOLN: 4.0000 mg | Freq: Four times a day (QID) | INTRAMUSCULAR | Status: DC | PRN

## 2018-05-06 MED ORDER — ASPIRIN 81 MG PO CHEW: 324.0000 mg | CHEWABLE_TABLET | ORAL | Status: DC

## 2018-05-06 MED ORDER — SODIUM CHLORIDE 0.9 % IV SOLN
INTRAVENOUS | Status: DC
  Administered 2018-05-06 – 2018-05-07 (×2): via INTRAVENOUS
  Administered 2018-05-10: 10 mL/h via INTRAVENOUS
  Administered 2018-05-12: 18:00:00 via INTRAVENOUS

## 2018-05-06 MED ORDER — VERAPAMIL HCL 2.5 MG/ML IV SOLN
INTRAVENOUS | Status: AC
  Filled 2018-05-06: qty 2

## 2018-05-06 MED ORDER — SODIUM CHLORIDE 0.9% FLUSH
3.0000 mL | Freq: Two times a day (BID) | INTRAVENOUS | Status: DC
  Administered 2018-05-07 – 2018-05-14 (×8): 3 mL via INTRAVENOUS

## 2018-05-06 MED ORDER — ACETAMINOPHEN 325 MG PO TABS: 650.0000 mg | ORAL_TABLET | ORAL | Status: DC | PRN

## 2018-05-06 MED ORDER — ASPIRIN 81 MG PO CHEW
324.0000 mg | CHEWABLE_TABLET | Freq: Once | ORAL | Status: AC
  Administered 2018-05-06: 324 mg via ORAL
  Filled 2018-05-06: qty 4

## 2018-05-06 MED ORDER — ASPIRIN EC 81 MG PO TBEC
81.0000 mg | DELAYED_RELEASE_TABLET | Freq: Every day | ORAL | Status: DC
  Administered 2018-05-07 – 2018-05-14 (×8): 81 mg via ORAL
  Filled 2018-05-06 (×9): qty 1

## 2018-05-06 MED ORDER — LIDOCAINE HCL (PF) 1 % IJ SOLN
INTRAMUSCULAR | Status: DC | PRN
  Administered 2018-05-06: 5 mL via SUBCUTANEOUS

## 2018-05-06 MED ORDER — HEPARIN SODIUM (PORCINE) 1000 UNIT/ML IJ SOLN
INTRAMUSCULAR | Status: DC | PRN
  Administered 2018-05-06: 4000 [IU] via INTRAVENOUS

## 2018-05-06 MED ORDER — HEPARIN (PORCINE) IN NACL 1000-0.9 UT/500ML-% IV SOLN
INTRAVENOUS | Status: DC | PRN
  Administered 2018-05-06 (×2): 500 mL

## 2018-05-06 MED ORDER — NITROGLYCERIN 1 MG/10 ML FOR IR/CATH LAB
INTRA_ARTERIAL | Status: AC
  Filled 2018-05-06: qty 10

## 2018-05-06 MED ORDER — IOHEXOL 350 MG/ML SOLN
INTRAVENOUS | Status: DC | PRN
  Administered 2018-05-06: 45 mL via INTRA_ARTERIAL

## 2018-05-06 MED ORDER — HEPARIN SODIUM (PORCINE) 5000 UNIT/ML IJ SOLN
4000.0000 [IU] | INTRAMUSCULAR | Status: AC
  Administered 2018-05-06: 4000 [IU] via INTRAVENOUS
  Filled 2018-05-06: qty 1

## 2018-05-06 MED ORDER — CLOPIDOGREL BISULFATE 75 MG PO TABS
300.0000 mg | ORAL_TABLET | Freq: Once | ORAL | Status: AC
  Administered 2018-05-07: 300 mg via ORAL
  Filled 2018-05-06: qty 4

## 2018-05-06 MED ORDER — ASPIRIN 300 MG RE SUPP: 300.0000 mg | RECTAL | Status: DC

## 2018-05-06 MED ORDER — HEPARIN SODIUM (PORCINE) 5000 UNIT/ML IJ SOLN: 60.0000 [IU]/kg | Freq: Once | INTRAMUSCULAR | Status: DC

## 2018-05-06 MED ORDER — LIDOCAINE HCL (PF) 1 % IJ SOLN
INTRAMUSCULAR | Status: AC
  Filled 2018-05-06: qty 30

## 2018-05-06 MED ORDER — VERAPAMIL HCL 2.5 MG/ML IV SOLN
INTRAVENOUS | Status: DC | PRN
  Administered 2018-05-06: 10 mL via INTRA_ARTERIAL

## 2018-05-06 MED ORDER — SODIUM CHLORIDE 0.9% FLUSH
3.0000 mL | Freq: Once | INTRAVENOUS | Status: AC
  Administered 2018-05-06: 3 mL via INTRAVENOUS

## 2018-05-06 MED ORDER — CLOPIDOGREL BISULFATE 75 MG PO TABS
75.0000 mg | ORAL_TABLET | Freq: Every day | ORAL | Status: DC
  Administered 2018-05-07 – 2018-05-14 (×8): 75 mg via ORAL
  Filled 2018-05-06 (×9): qty 1

## 2018-05-06 MED ORDER — SODIUM CHLORIDE 0.9% FLUSH: 3.0000 mL | INTRAVENOUS | Status: DC | PRN

## 2018-05-06 MED ORDER — ATORVASTATIN CALCIUM 80 MG PO TABS
80.0000 mg | ORAL_TABLET | Freq: Every day | ORAL | Status: DC
  Administered 2018-05-07 – 2018-05-14 (×8): 80 mg via ORAL
  Filled 2018-05-06 (×8): qty 1

## 2018-05-06 SURGICAL SUPPLY — 13 items
CATH 5FR JL3.5 JR4 ANG PIG MP (CATHETERS) ×2 IMPLANT
CATH LAUNCHER 6FR EBU3.5 (CATHETERS) ×2 IMPLANT
DEVICE RAD COMP TR BAND LRG (VASCULAR PRODUCTS) ×2 IMPLANT
GLIDESHEATH SLEND SS 6F .021 (SHEATH) ×2 IMPLANT
GUIDEWIRE INQWIRE 1.5J.035X260 (WIRE) ×1 IMPLANT
HOVERMATT SINGLE USE (MISCELLANEOUS) ×2 IMPLANT
INQWIRE 1.5J .035X260CM (WIRE) ×2
KIT ENCORE 26 ADVANTAGE (KITS) ×2 IMPLANT
KIT HEART LEFT (KITS) ×2 IMPLANT
PACK CARDIAC CATHETERIZATION (CUSTOM PROCEDURE TRAY) ×2 IMPLANT
SHEATH PROBE COVER 6X72 (BAG) ×2 IMPLANT
TRANSDUCER W/STOPCOCK (MISCELLANEOUS) ×2 IMPLANT
TUBING CIL FLEX 10 FLL-RA (TUBING) ×2 IMPLANT

## 2018-05-06 NOTE — ED Provider Notes (Signed)
[facility] EMERGENCY DEPARTMENT Provider Note   CSN: [id number] Arrival date & time: 05/06/18  2103    History   Chief Complaint Chief Complaint  Patient presents with  . Shortness of Breath  . Chest Pain    HPI Caleb Thomas is a 58 y.o. male.     The history is provided by the patient.  Chest Pain  Pain location:  L chest Pain quality: aching   Pain radiates to:  Does not radiate Pain severity:  Moderate Onset quality:  Unable to specify Duration:  2 days Timing:  Intermittent Progression:  Waxing and waning Chronicity:  New Context: at rest   Relieved by:  None tried Worsened by:  Nothing Ineffective treatments:  None tried Associated symptoms: no abdominal pain, no altered mental status, no back pain, no cough, no fever, no palpitations, no shortness of breath and no vomiting   Risk factors: high cholesterol and hypertension   Risk factors: no aortic disease and no coronary artery disease     Past Medical History:  Diagnosis Date  . Hyperlipidemia   . Hypertension 2015    Patient Active Problem List   Diagnosis Date Noted  . Acute ST elevation myocardial infarction (STEMI) involving left anterior descending coronary artery ([place]) 05/06/2018  . Erectile dysfunction 07/20/2017  . Need for Tdap vaccination 07/20/2017  . Hyperlipidemia 03/06/2017  . Abnormal CBC 03/06/2017  . Screen for colon cancer 03/06/2017  . Encounter for hepatitis C screening test for low risk patient 03/06/2017  . Essential hypertension 12/01/2016  . Class 1 obesity with serious comorbidity and body mass index (BMI) of 31.0 to 31.9 in adult 12/01/2016  . SOB (shortness of breath) 12/01/2016  . Influenza vaccine refused 12/01/2016    Past Surgical History:  Procedure Laterality Date  . COLONOSCOPY  2013   "normal" per patient, [facility] Medications    Prior to Admission medications   Medication Sig Start Date End Date Taking? Authorizing  Provider  hydrochlorothiazide (HYDRODIURIL) 12.5 MG tablet TAKE 1 TABLET BY MOUTH EVERY DAY WITH LOSARTAN 03/13/18   Tysinger, Camelia Eng, PA-C  ibuprofen (ADVIL,MOTRIN) 200 MG tablet Take 400 mg by mouth every 6 (six) hours as needed. For pain    [provider]  losartan (COZAAR) 50 MG tablet TAKE 1 TABLET BY MOUTH EVERY DAY 03/13/18   Tysinger, Camelia Eng, PA-C  Multiple Vitamin (MULTIVITAMIN WITH MINERALS) TABS Take 1 tablet by mouth daily.    [provider]  pravastatin (PRAVACHOL) 20 MG tablet Take 1 tablet (20 mg total) by mouth every evening. 03/13/18   Tysinger, Camelia Eng, PA-C  sildenafil (VIAGRA) 100 MG tablet TAKE 1 TABLET BY MOUTH EVERY DAY AS NEEDED 11/06/17   Tysinger, Camelia Eng, PA-C    Family History Family History  Problem Relation Age of Onset  . Stroke Mother   . Heart disease Father   . Hypertension Brother   . Stroke Brother   . Hypertension Brother   . Cancer Neg Hx     Social History Social History   Tobacco Use  . Smoking status: Former Research scientist (life sciences)  . Smokeless tobacco: Never Used  . Tobacco comment: quit smoking 58 years old ago  Substance Use Topics  . Alcohol use: Yes    Alcohol/week: 3.0 standard drinks    Types: 3 Cans of beer per week    Comment: on weekend   . Drug use: Not on file  Allergies   Patient has no known allergies.   Review of Systems Review of Systems  Constitutional: Negative for chills and fever.  HENT: Negative for ear pain and sore throat.   Eyes: Negative for pain and visual disturbance.  Respiratory: Negative for cough and shortness of breath.   Cardiovascular: Positive for chest pain. Negative for palpitations.  Gastrointestinal: Negative for abdominal pain and vomiting.  Genitourinary: Negative for dysuria and hematuria.  Musculoskeletal: Negative for arthralgias and back pain.  Skin: Negative for color change and rash.  Neurological: Negative for seizures and syncope.  All other systems reviewed and are  negative.    Physical Exam Updated Vital Signs BP 104/74   Pulse (!) 103   Temp 98.1 F (36.7 C)   Resp (!) 24   Ht 5\' 8"  (1.727 m)   Wt 95.3 kg   SpO2 96%   BMI 31.93 kg/m   Physical Exam Vitals signs and nursing note reviewed.  Constitutional:      Appearance: He is well-developed.  HENT:     Head: Normocephalic and atraumatic.  Eyes:     Conjunctiva/sclera: Conjunctivae normal.  Neck:     Musculoskeletal: Neck supple.  Cardiovascular:     Rate and Rhythm: Normal rate and regular rhythm.     Heart sounds: No murmur.  Pulmonary:     Effort: Pulmonary effort is normal. No respiratory distress.     Breath sounds: Normal breath sounds.  Abdominal:     Palpations: Abdomen is soft.     Tenderness: There is no abdominal tenderness.  Skin:    General: Skin is warm and dry.     Capillary Refill: Capillary refill takes less than 2 seconds.  Neurological:     General: No focal deficit present.     Mental Status: He is alert.  Psychiatric:        Mood and Affect: Mood normal.      ED Treatments / Results  Labs (all labs ordered are listed, but only abnormal results are displayed) Labs Reviewed  BASIC METABOLIC PANEL - Abnormal; Notable for the following components:      Result Value   Sodium 132 (*)    Potassium 3.0 (*)    Chloride 97 (*)    Glucose, Bld 121 (*)    BUN 43 (*)    Creatinine, Ser 1.84 (*)    Calcium 8.1 (*)    GFR calc non Af Amer 39 (*)    GFR calc Af Amer 45 (*)    All other components within normal limits  CBC - Abnormal; Notable for the following components:   WBC 14.1 (*)    MCV 77.9 (*)    All other components within normal limits  I-STAT TROPONIN, ED - Abnormal; Notable for the following components:   Troponin i, poc 16.81 (*)    All other components within normal limits  PROTIME-INR  APTT  LIPID PANEL  SAMPLE TO BLOOD BANK    EKG EKG Interpretation  Date/Time:  Sunday May 06 2018 21:21:03 EDT Ventricular Rate:  101 PR  Interval:  144 QRS Duration: 111 QT Interval:  345 QTC Calculation: 448 R Axis:   -88 Text Interpretation:  Sinus tachycardia `st elev v2-v5 concerning for stemi Confirmed by Lajean Saver [id number]) on 05/06/2018 9:38:37 PM   Radiology Dg Chest Port 1 View  Result Date: 05/06/2018 CLINICAL DATA:  Shortness of breath starting yesterday. Left-sided chest pain. EXAM: PORTABLE CHEST 1 VIEW COMPARISON:  10/11/2011 FINDINGS: Borderline  heart size with normal pulmonary vascularity. Probable small left pleural effusion with hazy infiltration behind the heart, possibly pneumonia. Right lung appears clear and expanded. Degenerative changes in the spine and shoulders. Calcification of the aorta. No pneumothorax. IMPRESSION: Probable small left pleural effusion with infiltration in the left lung base behind the heart, possibly pneumonia. Electronically Signed   By: Lucienne Capers M.D.   On: 05/06/2018 22:10    Procedures Procedures (including critical care time)  Medications Ordered in ED Medications  0.9 %  sodium chloride infusion ( Intravenous New Bag/Given 05/06/18 2154)  heparin ADULT infusion 100 units/mL (25000 units/248mL sodium chloride 0.45%) (1,000 Units/hr Intravenous New Bag/Given 05/06/18 2151)  lidocaine (PF) (XYLOCAINE) 1 % injection (5 mLs Subcutaneous Given 05/06/18 2214)  Radial Cocktail/Verapamil only (10 mLs Intra-arterial Given 05/06/18 2215)  heparin injection (4,000 Units Intravenous Given 05/06/18 2219)  iohexol (OMNIPAQUE) 350 MG/ML injection (45 mLs Intra-arterial Given 05/06/18 2240)  Heparin (Porcine) in NaCl 1000-0.9 UT/500ML-% SOLN (500 mLs  Given 05/06/18 2241)  sodium chloride flush (NS) 0.9 % injection 3 mL (3 mLs Intravenous Given 05/06/18 2152)  aspirin chewable tablet 324 mg (324 mg Oral Given 05/06/18 2149)  heparin injection 4,000 Units (4,000 Units Intravenous Given 05/06/18 2152)     Initial Impression / Assessment and Plan / ED Course  I have reviewed the triage vital signs  and the nursing notes.  Pertinent labs & imaging results that were available during my care of the patient were reviewed by me and considered in my medical decision making (see chart for details).        59 year old male significant past medical history of hypertension, hyperlipidemia who presents with 2 days of chest pain on the left side.  Patient presented the emergency department due to continued pain, increasing symptoms.  Patient's EKG on arrival concerning for anterior STEMI with elevations in the anterior leads, there is also some possible left bundle blocking based on the EKG as well.  However based on symptoms, risk factors code STEMI called upon arrival.  Laboratory studies reviewed and using decision making regarding management.  Patient given aspirin, heparin started.  Chest x-ray does not show widened mediastinum, patient denies any back pain.  Doubt dissection.  Troponin elevated at 16.  Patient taken emergently to Cath Lab for ready revascularization.  The above care was discussed and agreed upon by my attending physician.  Final Clinical Impressions(s) / ED Diagnoses   Final diagnoses:  Acute ST elevation myocardial infarction (STEMI), unspecified artery ([place])  Elevated troponin    ED Discharge Orders    None       Orson Aloe, MD 05/06/18 1683    Lajean Saver, MD 05/06/18 2250

## 2018-05-06 NOTE — ED Notes (Signed)
Called in Code [place]

## 2018-05-06 NOTE — Progress Notes (Signed)
   05/06/18 2330  Clinical Encounter Type  Visited With Patient;Family;Health care provider  Visit Type Code  Referral From Nurse  Consult/Referral To [place] responded to a Code Stemi for this PT.  Pt's spouse Enid Derry was worried and needed support.  PT taken to the Cath Lab and Chaplain escorted spouse and got her situated in [facility] waiting and provided empathetic listening and hospitality.   [place], [place].

## 2018-05-06 NOTE — H&P (Addendum)
Admit date: 05/06/2018 Referring Physician Lajean Saver. MD Primary Cardiologist None (new) Chief complaint/reason for admission:STEMI  HPI: Caleb Thomas is a 58 y.o. male who is being seen today for the evaluation of chest pain at the request of Dr. Maralyn Sago.    This is a 58 year old African-American male with a history of hyperlipidemia and hypertension as well as remote history of tobacco use who presented to the emergency room with complaints of chest pain.  Patient was in his usual state of health until last Thursday when he developed chest pain while lifting a heavy door.  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  He says he was doing some heavy lifting and all of a sudden developed chest pain on the left side of his chest.  There was no radiation of the pain into his arms neck or back.  There was no numbness in his arms or tingling.  He denied any associated symptoms of shortness of breath, nausea or diaphoresis.  He said the pain has been off and on since then although it has never gone completely away.  He said certain movements will make the pain worse.  When asked what made him come to the emergency room tonight he stated that he had been out of town today and when he got home he continued to have the pain and became concerned and presented to the emergency room.    Currently complains of 3 out of 10 pain.  Initial EKG showed ST elevation throughout the anterior precordial leads along with Q waves.  Initial labs show a creatinine of 1.84 which is new compared to creatinine on 03/09/2018 at 1.18.  Troponin elevated at 16.81.  LDL 82.  White blood cell count 14.1.   PMH:    Past Medical History:  Diagnosis Date  . Hyperlipidemia   . Hypertension 2015    PSH:    Past Surgical History:  Procedure Laterality Date  . COLONOSCOPY  2013   "normal" per patient, [place]    ALLERGIES:   Patient has no known allergies.  Prior to Admit Meds:     Medications Prior to Admission  Medication Sig Dispense Refill Last Dose  . hydrochlorothiazide (HYDRODIURIL) 12.5 MG tablet TAKE 1 TABLET BY MOUTH EVERY DAY WITH LOSARTAN 30 tablet 3   . ibuprofen (ADVIL,MOTRIN) 200 MG tablet Take 400 mg by mouth every 6 (six) hours as needed. For pain   Taking  . losartan (COZAAR) 50 MG tablet TAKE 1 TABLET BY MOUTH EVERY DAY 30 tablet 3   . Multiple Vitamin (MULTIVITAMIN WITH MINERALS) TABS Take 1 tablet by mouth daily.   Taking  . pravastatin (PRAVACHOL) 20 MG tablet Take 1 tablet (20 mg total) by mouth every evening. 30 tablet 3   . sildenafil (VIAGRA) 100 MG tablet TAKE 1 TABLET BY MOUTH EVERY DAY AS NEEDED 6 tablet 2 Taking   Family HX:    Family History  Problem Relation Age of Onset  . Stroke Mother   . Heart disease Father   . Hypertension Brother   . Stroke Brother   . Hypertension Brother   . Cancer Neg Hx    Social HX:    Social History   Socioeconomic History  . Marital status: Married    Spouse name: Not on file  . Number of children: Not on file  . Years of education: Not on file  . Highest education level: Not on file  Occupational History  .  Not on file  Social Needs  . Financial resource strain: Not on file  . Food insecurity:    Worry: Not on file    Inability: Not on file  . Transportation needs:    Medical: Not on file    Non-medical: Not on file  Tobacco Use  . Smoking status: Former Research scientist (life sciences)  . Smokeless tobacco: Never Used  . Tobacco comment: quit smoking 58 years old ago  Substance and Sexual Activity  . Alcohol use: Yes    Alcohol/week: 3.0 standard drinks    Types: 3 Cans of beer per week    Comment: on weekend   . Drug use: Not on file  . Sexual activity: Not on file  Lifestyle  . Physical activity:    Days per week: Not on file    Minutes per session: Not on file  . Stress: Not on file  Relationships  . Social connections:    Talks on phone: Not on file    Gets together: Not on file    Attends  religious service: Not on file    Active member of club or organization: Not on file    Attends meetings of clubs or organizations: Not on file    Relationship status: Not on file  . Intimate partner violence:    Fear of current or ex partner: Not on file    Emotionally abused: Not on file    Physically abused: Not on file    Forced sexual activity: Not on file  Other Topics Concern  . Not on file  Social History Narrative   Married, 10 children, 4 grandchildren.   Surveyor, mining at [employer].   Exercise with walking on treadmill few days per week.   Member of [employer].    06/2017     ROS:  All ROS were addressed and are negative except what is stated in the HPI  PHYSICAL EXAM Vitals:   05/06/18 2145 05/06/18 2211  BP: 104/74   Pulse: (!) 103   Resp: (!) 24   Temp:    SpO2: 93% 96%   General: Well developed, well nourished, in no acute distress Head: Eyes PERRLA, No xanthomas.   Normal cephalic and atramatic  Lungs:   Clear bilaterally to auscultation and percussion. Heart:   HRRR S1 S2 Pulses are 2+ & equal.            No carotid bruit. No JVD.  No abdominal bruits. No femoral bruits. Abdomen: Bowel sounds are positive, abdomen soft and non-tender without masses or                  Hernia's noted. Msk:  Back normal, normal gait. Normal strength and tone for age. Extremities:   No clubbing, cyanosis or edema.  DP +1 Neuro: Alert and oriented X 3. Psych:  Good affect, responds appropriately   Labs:   Lab Results  Component Value Date   WBC 14.1 (H) 05/06/2018   HGB 15.9 05/06/2018   HCT 44.4 05/06/2018   MCV 77.9 (L) 05/06/2018   PLT 274 05/06/2018    Recent Labs  Lab 05/06/18 2114  NA 132*  K 3.0*  CL 97*  CO2 24  BUN 43*  CREATININE 1.84*  CALCIUM 8.1*  GLUCOSE 121*   No results found for: CKTOTAL, CKMB, CKMBINDEX, TROPONINI No results found for: PTT Lab Results  Component Value Date   INR 1.2 05/06/2018     Lab Results  Component Value  Date  CHOL 157 03/09/2018   CHOL 143 03/06/2017   CHOL 173 12/01/2016   Lab Results  Component Value Date   HDL 35 (L) 03/09/2018   HDL 31 (L) 03/06/2017   HDL 38 (L) 12/01/2016   Lab Results  Component Value Date   LDLCALC 82 03/09/2018   LDLCALC 85 03/06/2017   LDLCALC 107 (H) 12/01/2016   Lab Results  Component Value Date   TRIG 199 (H) 03/09/2018   TRIG 169 (H) 03/06/2017   TRIG 168 (H) 12/01/2016   Lab Results  Component Value Date   CHOLHDL 4.5 03/09/2018   CHOLHDL 4.6 03/06/2017   CHOLHDL 4.6 12/01/2016   No results found for: LDLDIRECT    Radiology:  Dg Chest Port 1 View  Result Date: 05/06/2018 CLINICAL DATA:  Shortness of breath starting yesterday. Left-sided chest pain. EXAM: PORTABLE CHEST 1 VIEW COMPARISON:  10/11/2011 FINDINGS: Borderline heart size with normal pulmonary vascularity. Probable small left pleural effusion with hazy infiltration behind the heart, possibly pneumonia. Right lung appears clear and expanded. Degenerative changes in the spine and shoulders. Calcification of the aorta. No pneumothorax. IMPRESSION: Probable small left pleural effusion with infiltration in the left lung base behind the heart, possibly pneumonia. Electronically Signed   By: Lucienne Capers M.D.   On: 05/06/2018 22:10     Telemetry    Sinus rhythm- Personally Reviewed  ECG    Normal sinus rhythm with anterior infarct and 1.5 to 2 mm of ST segment elevation in the anterior precordial leads- Personally Reviewed   ASSESSMENT/PLAN:   1.  Anterior STEMI - patient presented with the 3 days of constant waxing and waning chest pain.  Tums are somewhat atypical in that there was no radiation of the pain, no associated symptoms of shortness of breath, nausea or diaphoresis.  EKG shows 1.5 to 2 mm of J-point elevation in the anterior precordial leads with Q waves.  Troponin is elevated at 16.81.  Suspect he is several days out from initial cardiac event which likely occurred  last Thursday but giving ongoing chest pain would recommend proceeding with cardiac catheterization tonight.  [place] this with Dr. end he will take the patient to the Cath Lab. -Admit to CCU telemetry -IV heparin drip per pharmacy -Hold on beta-blocker therapy given soft blood pressure -Continue to cycle troponin until it peaks -Start high-dose statin with Lipitor 80 mg daily -FLP in a.m. -Urgent cath per Dr. Harrell Gave End. -Echo in a.m. to assess LV function  2.  Hyperlipidemia -LDL goal is less than 70 -LDL was 82 on 03/09/2018 -Change pravastatin to Lipitor 80 mg daily -Need repeat FLP and ALT in 6 weeks  3.  Hypertension -BP soft on exam currently -hold losartan and diuretic for now   Fransico Him, MD  05/06/2018  10:35 PM

## 2018-05-06 NOTE — Brief Op Note (Signed)
BRIEF CARDIAC CATHETERIZATION NOTE  DATE: 05/06/2018 TIME: 10:53 PM  PATIENT:  Caleb Thomas  58 y.o. male  PRE-OPERATIVE DIAGNOSIS:  STEMI  POST-OPERATIVE DIAGNOSIS:  STEMI  PROCEDURE:  Procedure(s): Coronary/Graft Acute MI Revascularization (N/A) LEFT HEART CATH AND CORONARY ANGIOGRAPHY (N/A)  SURGEON:  Surgeon(s) and Role:    [name], MD - Primary  FINDINGS: 1. Severe single-vessel CAD with occluded mid LAD and faint left-to-left collaterals. 2. Severely reduced LVEF with anterior and apical akinesis; question LV apical thrombus. 3. Mildly elevated left ventricular filling pressure (LVEDP 15-20 mmHg).  RECOMMENDATIONS: 1. Given onset of symptoms 3-4 days ago and lack of ongoing chest pain, I recommend medical therapy, including 12 months of DAPT with aspirin and clopidogrel. 2. Obtain echo with Definity to assess for LV apical thrombus.  I will restart heparin infusion pending echo results. 3. Aggressive secondary prevention. 4. Given mildly elevated LVEDP and AKI, maintain net even fluid balance.  May need to consider gentle diuresis if dyspnea develops.  [name], MD [facility] Pager: [phone]

## 2018-05-06 NOTE — ED Triage Notes (Signed)
Pt reports that SOB started yesterday with some L sided CP, denies n/v

## 2018-05-07 ENCOUNTER — Encounter (HOSPITAL_COMMUNITY): Payer: Self-pay | Admitting: Internal Medicine

## 2018-05-07 ENCOUNTER — Inpatient Hospital Stay (HOSPITAL_COMMUNITY): Payer: 59

## 2018-05-07 DIAGNOSIS — R079 Chest pain, unspecified: Secondary | ICD-10-CM

## 2018-05-07 LAB — BASIC METABOLIC PANEL
ANION GAP: 11 (ref 5–15)
Anion gap: 10 (ref 5–15)
BUN: 35 mg/dL — ABNORMAL HIGH (ref 6–20)
BUN: 31 mg/dL — ABNORMAL HIGH (ref 6–20)
CALCIUM: 7.9 mg/dL — AB (ref 8.9–10.3)
CO2: 22 mmol/L (ref 22–32)
CO2: 20 mmol/L — ABNORMAL LOW (ref 22–32)
Calcium: 7.9 mg/dL — ABNORMAL LOW (ref 8.9–10.3)
Chloride: 100 mmol/L (ref 98–111)
Chloride: 103 mmol/L (ref 98–111)
Creatinine, Ser: 1.42 mg/dL — ABNORMAL HIGH (ref 0.61–1.24)
Creatinine, Ser: 1.44 mg/dL — ABNORMAL HIGH (ref 0.61–1.24)
GFR calc Af Amer: >60 mL/min (ref >60)
GFR calc Af Amer: >60 mL/min (ref >60)
GFR calc non Af Amer: 53 mL/min — ABNORMAL LOW (ref >60)
GFR calc non Af Amer: 52 mL/min — ABNORMAL LOW (ref >60)
Glucose, Bld: 107 mg/dL — ABNORMAL HIGH (ref 70–99)
Glucose, Bld: 185 mg/dL — ABNORMAL HIGH (ref 70–99)
Potassium: 3.3 mmol/L — ABNORMAL LOW (ref 3.5–5.1)
Potassium: 3.9 mmol/L (ref 3.5–5.1)
Sodium: 133 mmol/L — ABNORMAL LOW (ref 135–145)
Sodium: 133 mmol/L — ABNORMAL LOW (ref 135–145)

## 2018-05-07 LAB — HEMOGLOBIN A1C
Hgb A1c MFr Bld: 5.8 % — ABNORMAL HIGH (ref 4.8–5.6)
Mean Plasma Glucose: 119.76 mg/dL

## 2018-05-07 LAB — CBC WITH DIFFERENTIAL/PLATELET
Abs Immature Granulocytes: 0.06 10*3/uL (ref 0.00–0.07)
Basophils Absolute: 0.1 10*3/uL (ref 0.0–0.1)
Basophils Relative: 0 %
Eosinophils Absolute: 0.1 10*3/uL (ref 0.0–0.5)
Eosinophils Relative: 1 %
HCT: 43.3 % (ref 39.0–52.0)
Hemoglobin: 15.4 g/dL (ref 13.0–17.0)
Immature Granulocytes: 0 %
Lymphocytes Relative: 19 %
Lymphs Abs: 2.8 10*3/uL (ref 0.7–4.0)
MCH: 27.6 pg (ref 26.0–34.0)
MCHC: 35.6 g/dL (ref 30.0–36.0)
MCV: 77.7 fL — ABNORMAL LOW (ref 80.0–100.0)
Monocytes Absolute: 1.5 10*3/uL — ABNORMAL HIGH (ref 0.1–1.0)
Monocytes Relative: 10 %
Neutro Abs: 10 10*3/uL — ABNORMAL HIGH (ref 1.7–7.7)
Neutrophils Relative %: 70 %
Platelets: 261 10*3/uL (ref 150–400)
RBC: 5.57 MIL/uL (ref 4.22–5.81)
RDW: 12.8 % (ref 11.5–15.5)
WBC: 14.5 10*3/uL — ABNORMAL HIGH (ref 4.0–10.5)
nRBC: 0 % (ref 0.0–0.2)

## 2018-05-07 LAB — LIPID PANEL
CHOL/HDL RATIO: 9.5 ratio
Cholesterol: 142 mg/dL (ref 0–200)
HDL: 15 mg/dL — ABNORMAL LOW (ref >40)
LDL Cholesterol: 87 mg/dL (ref 0–99)
Triglycerides: 198 mg/dL — ABNORMAL HIGH (ref <150)
VLDL: 40 mg/dL (ref 0–40)

## 2018-05-07 LAB — COMPREHENSIVE METABOLIC PANEL
ALT: 53 U/L — ABNORMAL HIGH (ref 0–44)
AST: 61 U/L — ABNORMAL HIGH (ref 15–41)
Albumin: 2.5 g/dL — ABNORMAL LOW (ref 3.5–5.0)
Alkaline Phosphatase: 52 U/L (ref 38–126)
Anion gap: 14 (ref 5–15)
BUN: 43 mg/dL — ABNORMAL HIGH (ref 6–20)
CO2: 21 mmol/L — ABNORMAL LOW (ref 22–32)
Calcium: 8.1 mg/dL — ABNORMAL LOW (ref 8.9–10.3)
Chloride: 99 mmol/L (ref 98–111)
Creatinine, Ser: 1.64 mg/dL — ABNORMAL HIGH (ref 0.61–1.24)
GFR calc Af Amer: 52 mL/min — ABNORMAL LOW (ref >60)
GFR calc non Af Amer: 45 mL/min — ABNORMAL LOW (ref >60)
Glucose, Bld: 102 mg/dL — ABNORMAL HIGH (ref 70–99)
Potassium: 2.9 mmol/L — ABNORMAL LOW (ref 3.5–5.1)
Sodium: 134 mmol/L — ABNORMAL LOW (ref 135–145)
Total Bilirubin: 1.1 mg/dL (ref 0.3–1.2)
Total Protein: 7.3 g/dL (ref 6.5–8.1)

## 2018-05-07 LAB — TROPONIN I
TROPONIN I: 28 ng/mL — AB (ref <0.03)
Troponin I: 24.38 ng/mL (ref <0.03)
Troponin I: 24.03 ng/mL (ref <0.03)

## 2018-05-07 LAB — ECHOCARDIOGRAM COMPLETE
Height: 68 in
Weight: 3333.36 oz

## 2018-05-07 LAB — HEPARIN LEVEL (UNFRACTIONATED)
Heparin Unfractionated: <0.1 IU/mL — ABNORMAL LOW (ref 0.30–0.70)
Heparin Unfractionated: <0.1 IU/mL — ABNORMAL LOW (ref 0.30–0.70)

## 2018-05-07 LAB — MAGNESIUM: Magnesium: 2.8 mg/dL — ABNORMAL HIGH (ref 1.7–2.4)

## 2018-05-07 LAB — MRSA PCR SCREENING: MRSA by PCR: NEGATIVE

## 2018-05-07 LAB — PROTIME-INR
INR: 1.2 (ref 0.8–1.2)
Prothrombin Time: 15.5 seconds — ABNORMAL HIGH (ref 11.4–15.2)

## 2018-05-07 LAB — APTT: aPTT: 39 seconds — ABNORMAL HIGH (ref 24–36)

## 2018-05-07 LAB — TSH: TSH: 1.583 u[IU]/mL (ref 0.350–4.500)

## 2018-05-07 MED ORDER — POTASSIUM CHLORIDE CRYS ER 20 MEQ PO TBCR
40.0000 meq | EXTENDED_RELEASE_TABLET | ORAL | Status: AC
  Administered 2018-05-07 (×2): 40 meq via ORAL
  Filled 2018-05-07 (×2): qty 2

## 2018-05-07 MED ORDER — PERFLUTREN LIPID MICROSPHERE
INTRAVENOUS | Status: AC
  Administered 2018-05-07: 4 mL via INTRAVENOUS
  Filled 2018-05-07: qty 10

## 2018-05-07 MED ORDER — CARVEDILOL 3.125 MG PO TABS
3.1250 mg | ORAL_TABLET | Freq: Two times a day (BID) | ORAL | Status: DC
  Administered 2018-05-07 – 2018-05-10 (×7): 3.125 mg via ORAL
  Filled 2018-05-07 (×8): qty 1

## 2018-05-07 MED ORDER — POTASSIUM CHLORIDE CRYS ER 20 MEQ PO TBCR
40.0000 meq | EXTENDED_RELEASE_TABLET | Freq: Once | ORAL | Status: AC
  Administered 2018-05-07: 40 meq via ORAL
  Filled 2018-05-07: qty 2

## 2018-05-07 MED ORDER — HEPARIN (PORCINE) 25000 UT/250ML-% IV SOLN
1750.0000 [IU]/h | INTRAVENOUS | Status: DC
  Administered 2018-05-07: 1000 [IU]/h via INTRAVENOUS
  Administered 2018-05-08: 1700 [IU]/h via INTRAVENOUS
  Administered 2018-05-09: 2000 [IU]/h via INTRAVENOUS
  Administered 2018-05-10 – 2018-05-13 (×6): 1950 [IU]/h via INTRAVENOUS
  Filled 2018-05-07 (×13): qty 250

## 2018-05-07 MED ORDER — PERFLUTREN LIPID MICROSPHERE
1.0000 mL | INTRAVENOUS | Status: AC | PRN
  Administered 2018-05-07: 4 mL via INTRAVENOUS
  Filled 2018-05-07: qty 10

## 2018-05-07 NOTE — Progress Notes (Signed)
ANTICOAGULATION CONSULT NOTE - Follow Up Consult  Pharmacy Consult for Heparin  Indication: chest pain/ACS, s/p cath, r/o LV thrombus   No Known Allergies  Patient Measurements: Height: 5\' 8"  (172.7 cm) Weight: 208 lb 5.4 oz (94.5 kg) IBW/kg (Calculated) : 68.4  Vital Signs: Temp: 98 F (36.7 C) (03/09 1528) Temp Source: Oral (03/09 1528) BP: 111/76 (03/09 1647) Pulse Rate: 94 (03/09 1647)  Labs: Recent Labs    05/06/18 2114 05/06/18 2143 05/07/18 0040 05/07/18 0654 05/07/18 1058 05/07/18 1817 05/07/18 1942  HGB 15.9  --  15.4  --   --   --   --   HCT 44.4  --  43.3  --   --   --   --   PLT 274  --  261  --   --   --   --   APTT  --  31 39*  --   --   --   --   LABPROT  --  15.2 15.5*  --   --   --   --   INR  --  1.2 1.2  --   --   --   --   HEPARINUNFRC  --   --   --   --  <0.10*  --  <0.10*  CREATININE 1.84*  --  1.64* 1.42*  --  1.44*  --   TROPONINI  --   --  24.38* 24.03* 28.00*  --   --     Estimated Creatinine Clearance: 60.8 mL/min (A) (by C-G formula based on SCr of 1.44 mg/dL (H)).   Medical History: Past Medical History:  Diagnosis Date  . Hyperlipidemia   . Hypertension 2015    Assessment: 58 y/o M s/p cath, re-starting heparin after cath for possible LV thrombus - plan to rule out with ECHO.    Heparin level undetectable on heparin drip 1300 uts/hr - confirmed with RN - no issues with IV line.  No bolus with recent cath No bleeding noted.  Goal of Therapy:  Heparin level 0.3-0.7 units/ml Monitor platelets by anticoagulation protocol: Yes   Plan:  Increase IV heparin to 1700 units/hr Daily HL, CBC Monitor s/s bleeding F/u ECHO  Bonnita Nasuti Pharm.D. CPP, BCPS Clinical Pharmacist [phone] 05/07/2018 9:29 PM

## 2018-05-07 NOTE — Progress Notes (Signed)
ANTICOAGULATION CONSULT NOTE - Initial Consult  Pharmacy Consult for Heparin  Indication: chest pain/ACS, s/p cath, r/o LV thrombus   No Known Allergies  Patient Measurements: Height: 5\' 8"  (172.7 cm) Weight: 210 lb (95.3 kg) IBW/kg (Calculated) : 68.4  Vital Signs: Temp: 98.3 F (36.8 C) (03/08 2300) Temp Source: Oral (03/08 2300) BP: 109/75 (03/09 0100) Pulse Rate: 95 (03/09 0100)  Labs: Recent Labs    05/06/18 2114 05/06/18 2143  HGB 15.9  --   HCT 44.4  --   PLT 274  --   APTT  --  31  LABPROT  --  15.2  INR  --  1.2  CREATININE 1.84*  --     Estimated Creatinine Clearance: 47.8 mL/min (A) (by C-G formula based on SCr of 1.84 mg/dL (H)).   Medical History: Past Medical History:  Diagnosis Date  . Hyperlipidemia   . Hypertension 2015    Assessment: 58 y/o M s/p cath, re-starting heparin 2 hours after TR removal until LV thrombus can be ruled out with ECHO. CBC good. TR off at [place]. No current issues with cath site per RN.   Goal of Therapy:  Heparin level 0.3-0.7 units/ml Monitor platelets by anticoagulation protocol: Yes   Plan:  Re-start heparin drip at 1000 units/hr at 0245 Check heparin level at 1100 Daily CBC/HL Monitor for bleeding F/U ECHO results  [place], [place] 05/07/2018,1:24 AM

## 2018-05-07 NOTE — Progress Notes (Signed)
Progress Note  Patient Name: Caleb Thomas Date of Encounter: 05/07/2018  Primary Cardiologist: Dr. Fransico Him  Subjective   Postop day #1 cardiac catheterization demonstrating an occluded mid LAD with medical treatment recommended.  The patient is asymptomatic on IV heparin.  Inpatient Medications    Scheduled Meds: . aspirin EC  81 mg Oral Daily  . atorvastatin  80 mg Oral q1800  . clopidogrel  75 mg Oral Q breakfast  . sodium chloride flush  3 mL Intravenous Q12H   Continuous Infusions: . sodium chloride 20 mL/hr at 05/07/18 0800  . sodium chloride    . heparin 1,000 Units/hr (05/07/18 0800)   PRN Meds: sodium chloride, acetaminophen, ondansetron (ZOFRAN) IV, sodium chloride flush   Vital Signs    Vitals:   05/07/18 0500 05/07/18 0600 05/07/18 0700 05/07/18 0800  BP: 99/74 101/71 101/71 97/66  Pulse: 96 99 97 (!) 102  Resp: 20 (!) 26 (!) 32 (!) 32  Temp:    97.8 F (36.6 C)  TempSrc:    Oral  SpO2: 97% 95% 97% 97%  Weight:      Height:        Intake/Output Summary (Last 24 hours) at 05/07/2018 0909 Last data filed at 05/07/2018 0800 Gross per 24 hour  Intake 402.79 ml  Output 600 ml  Net -197.21 ml   Last 3 Weights 05/07/2018 05/06/2018 03/09/2018  Weight (lbs) 208 lb 5.4 oz 210 lb 210 lb  Weight (kg) 94.5 kg 95.255 kg 95.255 kg      Telemetry    Normal sinus rhythm- Personally Reviewed  ECG    Normal sinus rhythm at 97 with anteroseptal infarct and Q waves from V1 through V4 as well as inferior Q waves- Personally Reviewed  Physical Exam   GEN: No acute distress.   Neck: No JVD Cardiac: RRR, no murmurs, rubs, or gallops.  Respiratory: Clear to auscultation bilaterally. GI: Soft, nontender, non-distended  MS: No edema; No deformity. Neuro:  Nonfocal  Psych: Normal affect   Labs    Chemistry Recent Labs  Lab 05/06/18 2114 05/07/18 0040  NA 132* 134*  K 3.0* 2.9*  CL 97* 99  CO2 24 21*  GLUCOSE 121* 102*  BUN 43* 43*  CREATININE 1.84*  1.64*  CALCIUM 8.1* 8.1*  PROT  --  7.3  ALBUMIN  --  2.5*  AST  --  61*  ALT  --  53*  ALKPHOS  --  52  BILITOT  --  1.1  GFRNONAA 39* 45*  GFRAA 45* 52*  ANIONGAP 11 14     Hematology Recent Labs  Lab 05/06/18 2114 05/07/18 0040  WBC 14.1* 14.5*  RBC 5.70 5.57  HGB 15.9 15.4  HCT 44.4 43.3  MCV 77.9* 77.7*  MCH 27.9 27.6  MCHC 35.8 35.6  RDW 12.8 12.8  PLT 274 261    Cardiac Enzymes Recent Labs  Lab 05/07/18 0040  TROPONINI 24.38*    Recent Labs  Lab 05/06/18 2118  TROPIPOC 16.81*     BNPNo results for input(s): BNP, PROBNP in the last 168 hours.   DDimer No results for input(s): DDIMER in the last 168 hours.   Radiology    Dg Chest Port 1 View  Result Date: 05/06/2018 CLINICAL DATA:  Shortness of breath starting yesterday. Left-sided chest pain. EXAM: PORTABLE CHEST 1 VIEW COMPARISON:  10/11/2011 FINDINGS: Borderline heart size with normal pulmonary vascularity. Probable small left pleural effusion with hazy infiltration behind the heart, possibly pneumonia. Right  lung appears clear and expanded. Degenerative changes in the spine and shoulders. Calcification of the aorta. No pneumothorax. IMPRESSION: Probable small left pleural effusion with infiltration in the left lung base behind the heart, possibly pneumonia. Electronically Signed   By: Lucienne Capers M.D.   On: 05/06/2018 22:10    Cardiac Studies   Cardiac catheterization (05/06/2018)  Conclusions: 1. Severe single-vessel CAD with occluded mid LAD and faint left-to-left collaterals. 2. Severely reduced LVEF with anterior and apical akinesis; question LV apical thrombus. 3. Mildly elevated left ventricular filling pressure (LVEDP 15-20 mmHg).  Recommendations: 1. Given onset of symptoms 3-4 days ago and lack of ongoing chest pain, I recommend medical therapy, including 12 months of dual antiplatelet therapy with aspirin and clopidogrel. 2. Obtain echo with Definity to assess for LV apical  thrombus. I will restart heparin infusion 2 hours after TR band deflation pending echo results. 3. Aggressive secondary prevention. 4. Given mildly elevated LVEDP and acute kidney injury, maintain net even fluid balance. May need to consider gentle diuresis if dyspnea develops.  [name], MD [place] Pager: [phone]  Patient Profile     58 y.o. male with prior history of hypertension hyperlipidemia who developed chest pain late last week.  He was admitted by Dr. Radford Pax where his EKG showed acute anterior septal myocardial infarction.  He was taken urgently to the Cath Lab by Dr. Saunders Revel revealing an occluded LAD in the midportion which appeared to be subacute with minimal collaterals and an EF of 20 to 25% with severe anteroapical hypokinesia.  Assessment & Plan    1: Anterior STEMI- postop day 1 anterior STEMI although it is unclear how long prior to presentation his symptoms began.  Cardiac catheterization performed by Dr. Saunders Revel revealed an occluded mid LAD without significant collaterals and severe anteroapical hypokinesia.  His blood pressure somewhat soft making pharmacologic therapy problematic.  He is on dual antiplatelet therapy including aspirin and Plavix.  He is currently pain-free.  2: Ischemic cardiomyopathy- EF in the 25% range with what appears to be LV dilatation and anteroapical severe hypokinesia.  There is a question of an apical filling defect.  He is on IV heparin.  2D echo with Definity contrast is scheduled to rule out apical mural thrombus which would not sensitivity beginning Coumadin anticoagulation and suggest that his infarct was older than anticipated.  We will attempt to begin him on low-dose carvedilol.  His renal function is somewhat compromised with a creatinine in the 1 6 range although baseline is 1.1.  He was on hydrochlorothiazide and losartan as an outpatient.  If his creatinine comes back down to baseline we will begin him on low-dose  Spironolactone.  I do not think that his blood pressure would allow him to be started on Entresto at this time.  3: Essential hypertension- on losartan and hydrochlorothiazide as an outpatient.  Blood pressures are soft here in the hospital.  We will begin him on low-dose carvedilol.  Losartan and hydrochlorothiazide are currently on hold.  4: Hyperlipidemia- on Pravachol as an outpatient switch to high-dose atorvastatin  For questions or updates, please contact [place] Please consult [email or web address] for contact info under        Signed, Quay Burow, MD  05/07/2018, 9:09 AM

## 2018-05-07 NOTE — Plan of Care (Signed)
  Problem: Education: Goal: Understanding of CV disease, CV risk reduction, and recovery process will improve Outcome: Progressing   Problem: Activity: Goal: Ability to return to baseline activity level will improve Outcome: Progressing   

## 2018-05-07 NOTE — Progress Notes (Signed)
CARDIAC REHAB PHASE I   PRE:  Rate/Rhythm: 98 SR  BP:  Sitting: 97/65      SaO2: 96 RA  MODE:  Ambulation: 200 ft 113 peak HR  POST:  Rate/Rhythm: 98 SR  BP:  Sitting: 102/74    SaO2: 98 RA   Pt ambulated 262ft in hallway assist of one with slow steady gait. Pt took one standing rest break with c/o SOB. Pt denies CP. Pt and wife educated on importance of ASA, Plavix, statin, and NTG. Pt given MI book. Reviewed restrictions and exercise guidelines. Will follow-up tomorrow.  [phone] Rufina Falco, RN BSN 05/07/2018 11:12 AM

## 2018-05-07 NOTE — Progress Notes (Signed)
  Echocardiogram 2D Echocardiogram has been performed.  Caleb Thomas 05/07/2018, 10:30 AM

## 2018-05-07 NOTE — Progress Notes (Signed)
ANTICOAGULATION CONSULT NOTE - Initial Consult  Pharmacy Consult for Heparin  Indication: chest pain/ACS, s/p cath, r/o LV thrombus   No Known Allergies  Patient Measurements: Height: 5\' 8"  (172.7 cm) Weight: 208 lb 5.4 oz (94.5 kg) IBW/kg (Calculated) : 68.4  Vital Signs: Temp: 97.8 F (36.6 C) (03/09 0800) Temp Source: Oral (03/09 0800) BP: 108/70 (03/09 0900) Pulse Rate: 99 (03/09 0900)  Labs: Recent Labs    05/06/18 2114 05/06/18 2143 05/07/18 0040 05/07/18 0654  HGB 15.9  --  15.4  --   HCT 44.4  --  43.3  --   PLT 274  --  261  --   APTT  --  31 39*  --   LABPROT  --  15.2 15.5*  --   INR  --  1.2 1.2  --   CREATININE 1.84*  --  1.64* 1.42*  TROPONINI  --   --  24.38* 24.03*    Estimated Creatinine Clearance: 61.7 mL/min (A) (by C-G formula based on SCr of 1.42 mg/dL (H)).   Medical History: Past Medical History:  Diagnosis Date  . Hyperlipidemia   . Hypertension 2015    Assessment: 58 y/o M s/p cath, re-starting heparin 2 hours after TR removal until LV thrombus can be ruled out with ECHO. CBC good. TR off at [place].   Heparin level undetectable, read of echo pending. No bleeding or IV issues noted.  Goal of Therapy:  Heparin level 0.3-0.7 units/ml Monitor platelets by anticoagulation protocol: Yes   Plan:  Increase IV heparin to 1300 units/hr F/U ECHO results  Erin Hearing PharmD., BCPS Clinical Pharmacist 05/07/2018 11:17 AM

## 2018-05-08 ENCOUNTER — Inpatient Hospital Stay (HOSPITAL_COMMUNITY): Payer: 59

## 2018-05-08 DIAGNOSIS — I255 Ischemic cardiomyopathy: Secondary | ICD-10-CM

## 2018-05-08 LAB — CBC
HCT: 41.3 % (ref 39.0–52.0)
Hemoglobin: 14.9 g/dL (ref 13.0–17.0)
MCH: 27.8 pg (ref 26.0–34.0)
MCHC: 36.1 g/dL — ABNORMAL HIGH (ref 30.0–36.0)
MCV: 77.1 fL — ABNORMAL LOW (ref 80.0–100.0)
NRBC: 0 % (ref 0.0–0.2)
Platelets: 304 10*3/uL (ref 150–400)
RBC: 5.36 MIL/uL (ref 4.22–5.81)
RDW: 12.8 % (ref 11.5–15.5)
WBC: 10.5 10*3/uL (ref 4.0–10.5)

## 2018-05-08 LAB — BASIC METABOLIC PANEL
Anion gap: 9 (ref 5–15)
BUN: 23 mg/dL — ABNORMAL HIGH (ref 6–20)
CO2: 19 mmol/L — ABNORMAL LOW (ref 22–32)
Calcium: 8 mg/dL — ABNORMAL LOW (ref 8.9–10.3)
Chloride: 105 mmol/L (ref 98–111)
Creatinine, Ser: 1.19 mg/dL (ref 0.61–1.24)
GFR calc non Af Amer: >60 mL/min (ref >60)
Glucose, Bld: 107 mg/dL — ABNORMAL HIGH (ref 70–99)
POTASSIUM: 3.7 mmol/L (ref 3.5–5.1)
Sodium: 133 mmol/L — ABNORMAL LOW (ref 135–145)

## 2018-05-08 LAB — HEPARIN LEVEL (UNFRACTIONATED)
Heparin Unfractionated: 0.22 IU/mL — ABNORMAL LOW (ref 0.30–0.70)
Heparin Unfractionated: 0.37 IU/mL (ref 0.30–0.70)
Heparin Unfractionated: 0.29 IU/mL — ABNORMAL LOW (ref 0.30–0.70)

## 2018-05-08 MED ORDER — SPIRONOLACTONE 12.5 MG HALF TABLET
12.5000 mg | ORAL_TABLET | Freq: Every day | ORAL | Status: DC
  Administered 2018-05-08 – 2018-05-14 (×7): 12.5 mg via ORAL
  Filled 2018-05-08 (×7): qty 1

## 2018-05-08 MED ORDER — GADOBUTROL 1 MMOL/ML IV SOLN
10.0000 mL | Freq: Once | INTRAVENOUS | Status: AC | PRN
  Administered 2018-05-08: 10 mL via INTRAVENOUS

## 2018-05-08 NOTE — Progress Notes (Signed)
ANTICOAGULATION CONSULT NOTE - Follow Up Consult  Pharmacy Consult for heparin Indication: r/o LV thrombus  Labs: Recent Labs    05/06/18 2114 05/06/18 2143 05/07/18 0040 05/07/18 0654 05/07/18 1058 05/07/18 1817 05/07/18 1942 05/08/18 0359  HGB 15.9  --  15.4  --   --   --   --  14.9  HCT 44.4  --  43.3  --   --   --   --  41.3  PLT 274  --  261  --   --   --   --  304  APTT  --  31 39*  --   --   --   --   --   LABPROT  --  15.2 15.5*  --   --   --   --   --   INR  --  1.2 1.2  --   --   --   --   --   HEPARINUNFRC  --   --   --   --  <0.10*  --  <0.10* 0.22*  CREATININE 1.84*  --  1.64* 1.42*  --  1.44*  --   --   TROPONINI  --   --  24.38* 24.03* 28.00*  --   --   --     Assessment: 58yo male remains subtherapeutic on heparin after rate changes though now closer to goal.  Goal of Therapy:  Heparin level 0.3-0.7 units/ml   Plan:  Will increase heparin gtt by 2 units/kg/hr to 1900 units/hr and check level in 6 hours.    Wynona Neat, PharmD, BCPS  05/08/2018,5:07 AM

## 2018-05-08 NOTE — Progress Notes (Signed)
ANTICOAGULATION CONSULT NOTE - Follow Up Consult  Pharmacy Consult for heparin Indication: r/o LV thrombus  Labs: Recent Labs    05/06/18 2114 05/06/18 2143 05/07/18 0040 05/07/18 0654 05/07/18 1058 05/07/18 1817  05/08/18 0359 05/08/18 1132 05/08/18 1921  HGB 15.9  --  15.4  --   --   --   --  14.9  --   --   HCT 44.4  --  43.3  --   --   --   --  41.3  --   --   PLT 274  --  261  --   --   --   --  304  --   --   APTT  --  31 39*  --   --   --   --   --   --   --   LABPROT  --  15.2 15.5*  --   --   --   --   --   --   --   INR  --  1.2 1.2  --   --   --   --   --   --   --   HEPARINUNFRC  --   --   --   --  <0.10*  --    < > 0.22* 0.37 0.29*  CREATININE 1.84*  --  1.64* 1.42*  --  1.44*  --  1.19  --   --   TROPONINI  --   --  24.38* 24.03* 28.00*  --   --   --   --   --    < > = values in this interval not displayed.    Assessment: 58yo male remains on heparin for management of possible LV thrombus.   Heparin level this evening came back subtherapeutic at 0.29, on 1900 units/hr. No s/sx of bleeding. No infusion issues per nursing.   Goal of Therapy:  Heparin level 0.3-0.7 units/ml   Plan:  Will increase heparin drip at 2000 units/hr Monitor daily heparin level, CBCs, and s/sx of bleeding Will follow up with Cardiac MRI results   Antonietta Jewel, PharmD, [place] Clinical Pharmacist  Pager: [phone] Phone: [phone] 05/08/2018   8:12 PM

## 2018-05-08 NOTE — Progress Notes (Signed)
ANTICOAGULATION CONSULT NOTE - Follow Up Consult  Pharmacy Consult for heparin Indication: r/o LV thrombus  Labs: Recent Labs    05/06/18 2114 05/06/18 2143 05/07/18 0040 05/07/18 0654  05/07/18 1058 05/07/18 1817 05/07/18 1942 05/08/18 0359 05/08/18 1132  HGB 15.9  --  15.4  --   --   --   --   --  14.9  --   HCT 44.4  --  43.3  --   --   --   --   --  41.3  --   PLT 274  --  261  --   --   --   --   --  304  --   APTT  --  31 39*  --   --   --   --   --   --   --   LABPROT  --  15.2 15.5*  --   --   --   --   --   --   --   INR  --  1.2 1.2  --   --   --   --   --   --   --   HEPARINUNFRC  --   --   --   --    < > <0.10*  --  <0.10* 0.22* 0.37  CREATININE 1.84*  --  1.64* 1.42*  --   --  1.44*  --  1.19  --   TROPONINI  --   --  24.38* 24.03*  --  28.00*  --   --   --   --    < > = values in this interval not displayed.    Assessment: 58yo male remains on heparin for management of possible LV thrombus.   Heparin therapeutic at 0.37 today. CBCs stable. No s/sx of bleeding per nurse.   Goal of Therapy:  Heparin level 0.3-0.7 units/ml   Plan:  Will continue heparin drip at 1900 units/hr Will check confirmatory heparin level at 6 hours  Monitor daily heparin level, CBCs, and s/sx of bleeding Will follow up with Cardiac MRI results   Gwenlyn Found, Sherian Rein D PGY1 Pharmacy Resident  Phone [phone] 05/08/2018   12:15 PM

## 2018-05-08 NOTE — Progress Notes (Signed)
CARDIAC REHAB PHASE I   PRE:  Rate/Rhythm: 94 SR  BP:  Sitting: 119/77      SaO2: 93 RA  MODE:  Ambulation: 220 ft   POST:  Rate/Rhythm: 101 SR with PVCs  BP:  Sitting: 131/79    SaO2: 97 RA   Pt ambulated 227ft in hallway standby assist with slow steady gait. Pt denies CP, has some mild SOB, but much improved over yesterday. Pt encouraged to walk again today but take it slow.  Pt and wife given HF booklet along with low sodium diets. Will refer to CRP II [place]. Will continue to follow.  [id number] Caleb Falco, RN BSN 05/08/2018 11:19 AM

## 2018-05-08 NOTE — Progress Notes (Addendum)
Progress Note  Patient Name: Caleb Thomas Date of Encounter: 05/08/2018  Primary Cardiologist: No primary care provider on file.   Subjective   No complaints today.   Inpatient Medications    Scheduled Meds: . aspirin EC  81 mg Oral Daily  . atorvastatin  80 mg Oral q1800  . carvedilol  3.125 mg Oral BID WC  . clopidogrel  75 mg Oral Q breakfast  . sodium chloride flush  3 mL Intravenous Q12H   Continuous Infusions: . sodium chloride 20 mL/hr at 05/07/18 1400  . sodium chloride    . heparin 1,900 Units/hr (05/08/18 0732)   PRN Meds: sodium chloride, acetaminophen, ondansetron (ZOFRAN) IV, sodium chloride flush   Vital Signs    Vitals:   05/07/18 1647 05/07/18 2135 05/08/18 0508 05/08/18 0509  BP: 111/76 105/72 102/70   Pulse: 94 93 89   Resp:      Temp:  99.1 F (37.3 C) 98.9 F (37.2 C)   TempSrc:  Oral Oral   SpO2:  100% 98%   Weight:    92.8 kg  Height:        Intake/Output Summary (Last 24 hours) at 05/08/2018 0937 Last data filed at 05/07/2018 1832 Gross per 24 hour  Intake 1091.94 ml  Output -  Net 1091.94 ml   Last 3 Weights 05/08/2018 05/07/2018 05/06/2018  Weight (lbs) 204 lb 9.6 oz 208 lb 5.4 oz 210 lb  Weight (kg) 92.806 kg 94.5 kg 95.255 kg      Telemetry    SR - Personally Reviewed  ECG    SR with continued ST elevation in anterolateral leads - Personally Reviewed  Physical Exam   GEN: No acute distress.   Neck: No JVD Cardiac: RRR, no murmurs, rubs, or gallops.  Respiratory: Clear to auscultation bilaterally. GI: Soft, nontender, non-distended  MS: No edema; No deformity. Right radial cath site stable.  Neuro:  Nonfocal  Psych: Normal affect   Labs    Chemistry Recent Labs  Lab 05/07/18 0040 05/07/18 0654 05/07/18 1817 05/08/18 0359  NA 134* 133* 133* 133*  K 2.9* 3.3* 3.9 3.7  CL 99 100 103 105  CO2 21* 22 20* 19*  GLUCOSE 102* 107* 185* 107*  BUN 43* 35* 31* 23*  CREATININE 1.64* 1.42* 1.44* 1.19  CALCIUM 8.1* 7.9*  7.9* 8.0*  PROT 7.3  --   --   --   ALBUMIN 2.5*  --   --   --   AST 61*  --   --   --   ALT 53*  --   --   --   ALKPHOS 52  --   --   --   BILITOT 1.1  --   --   --   GFRNONAA 45* 53* 52* >60  GFRAA 52* >60 >60 >60  ANIONGAP 14 11 10 9      Hematology Recent Labs  Lab 05/06/18 2114 05/07/18 0040 05/08/18 0359  WBC 14.1* 14.5* 10.5  RBC 5.70 5.57 5.36  HGB 15.9 15.4 14.9  HCT 44.4 43.3 41.3  MCV 77.9* 77.7* 77.1*  MCH 27.9 27.6 27.8  MCHC 35.8 35.6 36.1*  RDW 12.8 12.8 12.8  PLT 274 261 304    Cardiac Enzymes Recent Labs  Lab 05/07/18 0040 05/07/18 0654 05/07/18 1058  TROPONINI 24.38* 24.03* 28.00*    Recent Labs  Lab 05/06/18 2118  TROPIPOC 16.81*     BNPNo results for input(s): BNP, PROBNP in the last 168 hours.  DDimer No results for input(s): DDIMER in the last 168 hours.   Radiology    Dg Chest Port 1 View  Result Date: 05/06/2018 CLINICAL DATA:  Shortness of breath starting yesterday. Left-sided chest pain. EXAM: PORTABLE CHEST 1 VIEW COMPARISON:  10/11/2011 FINDINGS: Borderline heart size with normal pulmonary vascularity. Probable small left pleural effusion with hazy infiltration behind the heart, possibly pneumonia. Right lung appears clear and expanded. Degenerative changes in the spine and shoulders. Calcification of the aorta. No pneumothorax. IMPRESSION: Probable small left pleural effusion with infiltration in the left lung base behind the heart, possibly pneumonia. Electronically Signed   By: Lucienne Capers M.D.   On: 05/06/2018 22:10    Cardiac Studies   Cardiac catheterization (05/06/2018)  Conclusions: 1. Severe single-vessel CAD with occluded mid LAD and faint left-to-left collaterals. 2. Severely reduced LVEF with anterior and apical akinesis; question LV apical thrombus. 3. Mildly elevated left ventricular filling pressure (LVEDP 15-20 mmHg).  Recommendations: 1. Given onset of symptoms 3-4 days ago and lack of ongoing chest pain,  I recommend medical therapy, including 12 months of dual antiplatelet therapy with aspirin and clopidogrel. 2. Obtain echo with Definity to assess for LV apical thrombus. I will restart heparin infusion 2 hours after TR band deflation pending echo results. 3. Aggressive secondary prevention. 4. Given mildly elevated LVEDP and acute kidney injury, maintain net even fluid balance. May need to consider gentle diuresis if dyspnea develops.  [name], MD [place] Pager: [phone]  TTE: 05/07/2018  IMPRESSIONS    1. The left ventricle has a visually estimated ejection fraction of of 30%. The cavity size was normal. There is mildly increased left ventricular wall thickness. Left ventricular diastolic Doppler parameters are consistent with impaired relaxation.  Akinesis of the mid to apical anteroseptal, anterior, and inferoseptal walls, the apical inferior wall, and the true apex. There appeared to be an LV apical thrombus on non-contrast enhanced images. On images with Definity, surprisingly this was not  confirmed. Would treat for LV thrombus but would also confirm with cardiac MRI.  2. The right ventricle has normal systolic function. The cavity was normal. There is no increase in right ventricular wall thickness.  3. No evidence of mitral valve stenosis. No mitral regurgitation.  4. The aortic valve is tricuspid Mild calcification of the aortic valve. no stenosis of the aortic valve.  5. The aortic root and ascending aorta are normal in size and structure.  6. The IVC was normal in size. No complete TR doppler jet so unable to estimate PA systolic pressure.  Patient Profile     58 y.o. male prior history of hypertension hyperlipidemia who developed chest pain late last week.  He was admitted by Dr. Radford Pax where his EKG showed acute anterior septal myocardial infarction.  He was taken urgently to the Cath Lab by Dr. Saunders Revel revealing an occluded LAD in the midportion which  appeared to be subacute with minimal collaterals and an EF of 20 to 25% with severe anteroapical hypokinesia.  Assessment & Plan    1. Anterior STEMI: Cardiac catheterization performed by Dr. Saunders Revel revealed an occluded mid LAD without significant collaterals and severe anteroapical hypokinesia.  Unclear when symptoms began. Given presentation, placed on DAPT with ASA/plavix. BB added yesterday  2. Ischemic cardiomyopathy: EF in the 25% range with what appears to be LV dilatation and anteroapical severe hypokinesia.  There is a question of an apical filling defect.  He is on IV heparin.  Echo with  contrast yesterday did not show LV thrombus but recommendation to do cardiac MRI and treat for now. Will continue on IV heparin until MRI completed.  -- tolerating low dose BB, will add spiro 12.5 today  3: Essential hypertension- stable with current therapy. Will add spiro today  4: Hyperlipidemia- on high dose statin now. LDL 87, Trig 198. Recheck in 6 weeks.   For questions or updates, please contact [place] Please consult [email or web address] for contact info under   Signed, Reino Bellis, NP  05/08/2018, 9:37 AM    Agree with note by Reino Bellis NP-C  Postop day 2 cardiac catheterization revealing an occluded LAD for what appeared to be a subacute LAD infarct with ischemic cardiomyopathy and severe LV dysfunction.  He said no recurrent chest pain.  He is on good medical therapy.  The problems include hypertension hyperlipidemia medically treated.  A definitive 2D echo could apparently not rule out apical mural thrombus and as result the patient scheduled for cardiac MRI.  If this is positive he will need Coumadin anticoagulation and if negative can stop heparin and discharge home with close outpatient follow-up.   Lorretta Harp, M.D., [place], [facility], [place] [address]. [place], [place]  [address]  [phone] 05/08/2018 10:51  AM

## 2018-05-09 DIAGNOSIS — I236 Thrombosis of atrium, auricular appendage, and ventricle as current complications following acute myocardial infarction: Secondary | ICD-10-CM

## 2018-05-09 LAB — BASIC METABOLIC PANEL
Anion gap: 7 (ref 5–15)
BUN: 19 mg/dL (ref 6–20)
CO2: 22 mmol/L (ref 22–32)
Calcium: 8.2 mg/dL — ABNORMAL LOW (ref 8.9–10.3)
Chloride: 108 mmol/L (ref 98–111)
Creatinine, Ser: 1.14 mg/dL (ref 0.61–1.24)
GFR calc Af Amer: >60 mL/min (ref >60)
GFR calc non Af Amer: >60 mL/min (ref >60)
Glucose, Bld: 98 mg/dL (ref 70–99)
POTASSIUM: 4.1 mmol/L (ref 3.5–5.1)
Sodium: 137 mmol/L (ref 135–145)

## 2018-05-09 LAB — HEPARIN LEVEL (UNFRACTIONATED): Heparin Unfractionated: 0.38 IU/mL (ref 0.30–0.70)

## 2018-05-09 LAB — PROTIME-INR
INR: 1.2 (ref 0.8–1.2)
Prothrombin Time: 15.2 seconds (ref 11.4–15.2)

## 2018-05-09 MED ORDER — WARFARIN SODIUM 5 MG PO TABS
5.0000 mg | ORAL_TABLET | Freq: Once | ORAL | Status: AC
  Administered 2018-05-09: 5 mg via ORAL
  Filled 2018-05-09: qty 1

## 2018-05-09 MED ORDER — WARFARIN - PHARMACIST DOSING INPATIENT
Freq: Every day | Status: DC
  Administered 2018-05-10 – 2018-05-13 (×2)

## 2018-05-09 NOTE — Progress Notes (Addendum)
Progress Note  Patient Name: Caleb Thomas Date of Encounter: 05/09/2018  Primary Cardiologist: No primary care provider on file.   Subjective   No complaints.   Inpatient Medications    Scheduled Meds: . aspirin EC  81 mg Oral Daily  . atorvastatin  80 mg Oral q1800  . carvedilol  3.125 mg Oral BID WC  . clopidogrel  75 mg Oral Q breakfast  . sodium chloride flush  3 mL Intravenous Q12H  . spironolactone  12.5 mg Oral Daily   Continuous Infusions: . sodium chloride 20 mL/hr at 05/07/18 1400  . sodium chloride    . heparin 2,000 Units/hr (05/09/18 0415)   PRN Meds: sodium chloride, acetaminophen, ondansetron (ZOFRAN) IV, sodium chloride flush   Vital Signs    Vitals:   05/08/18 1407 05/08/18 2003 05/08/18 2058 05/09/18 0423  BP: 108/71 108/71  96/65  Pulse: 93 96  84  Resp:      Temp: 98.8 F (37.1 C)  98.8 F (37.1 C) 99.3 F (37.4 C)  TempSrc: Oral  Oral Oral  SpO2: 98% 96%  97%  Weight:    93 kg  Height:        Intake/Output Summary (Last 24 hours) at 05/09/2018 0841 Last data filed at 05/09/2018 0600 Gross per 24 hour  Intake 1109.09 ml  Output -  Net 1109.09 ml   Last 3 Weights 05/09/2018 05/08/2018 05/07/2018  Weight (lbs) 205 lb 204 lb 9.6 oz 208 lb 5.4 oz  Weight (kg) 92.987 kg 92.806 kg 94.5 kg      Telemetry    SR, short run of NSVT - Personally Reviewed  ECG    N/a - Personally Reviewed  Physical Exam   GEN: No acute distress.   Neck: No JVD Cardiac: RRR, no murmurs, rubs, or gallops.  Respiratory: Clear to auscultation bilaterally. GI: Soft, nontender, non-distended  MS: No edema; No deformity.  Neuro:  Nonfocal  Psych: Normal affect   Labs    Chemistry Recent Labs  Lab 05/07/18 0040  05/07/18 1817 05/08/18 0359 05/09/18 0401  NA 134*   < > 133* 133* 137  K 2.9*   < > 3.9 3.7 4.1  CL 99   < > 103 105 108  CO2 21*   < > 20* 19* 22  GLUCOSE 102*   < > 185* 107* 98  BUN 43*   < > 31* 23* 19  CREATININE 1.64*   < > 1.44*  1.19 1.14  CALCIUM 8.1*   < > 7.9* 8.0* 8.2*  PROT 7.3  --   --   --   --   ALBUMIN 2.5*  --   --   --   --   AST 61*  --   --   --   --   ALT 53*  --   --   --   --   ALKPHOS 52  --   --   --   --   BILITOT 1.1  --   --   --   --   GFRNONAA 45*   < > 52* >60 >60  GFRAA 52*   < > >60 >60 >60  ANIONGAP 14   < > 10 9 7    < > = values in this interval not displayed.     Hematology Recent Labs  Lab 05/06/18 2114 05/07/18 0040 05/08/18 0359  WBC 14.1* 14.5* 10.5  RBC 5.70 5.57 5.36  HGB 15.9 15.4 14.9  HCT  44.4 43.3 41.3  MCV 77.9* 77.7* 77.1*  MCH 27.9 27.6 27.8  MCHC 35.8 35.6 36.1*  RDW 12.8 12.8 12.8  PLT 274 261 304    Cardiac Enzymes Recent Labs  Lab 05/07/18 0040 05/07/18 0654 05/07/18 1058  TROPONINI 24.38* 24.03* 28.00*    Recent Labs  Lab 05/06/18 2118  TROPIPOC 16.81*     BNPNo results for input(s): BNP, PROBNP in the last 168 hours.   DDimer No results for input(s): DDIMER in the last 168 hours.   Radiology    No results found.  Cardiac Studies   Cath: 05/06/2018  Conclusions: 1. Severe single-vessel CAD with occluded mid LAD and faint left-to-left collaterals. 2. Severely reduced LVEF with anterior and apical akinesis; question LV apical thrombus. 3. Mildly elevated left ventricular filling pressure (LVEDP 15-20 mmHg).  Recommendations: 1. Given onset of symptoms 3-4 days ago and lack of ongoing chest pain, I recommend medical therapy, including 12 months of dual antiplatelet therapy with aspirin and clopidogrel. 2. Obtain echo with Definity to assess for LV apical thrombus. I will restart heparin infusion 2 hours after TR band deflation pending echo results. 3. Aggressive secondary prevention. 4. Given mildly elevated LVEDP and acute kidney injury, maintain net even fluid balance. May need to consider gentle diuresis if dyspnea develops.  [name], MD  Echo: 05/07/2018  IMPRESSIONS    1. The left ventricle has a visually  estimated ejection fraction of of 30%. The cavity size was normal. There is mildly increased left ventricular wall thickness. Left ventricular diastolic Doppler parameters are consistent with impaired relaxation.  Akinesis of the mid to apical anteroseptal, anterior, and inferoseptal walls, the apical inferior wall, and the true apex. There appeared to be an LV apical thrombus on non-contrast enhanced images. On images with Definity, surprisingly this was not  confirmed. Would treat for LV thrombus but would also confirm with cardiac MRI.  2. The right ventricle has normal systolic function. The cavity was normal. There is no increase in right ventricular wall thickness.  3. No evidence of mitral valve stenosis. No mitral regurgitation.  4. The aortic valve is tricuspid Mild calcification of the aortic valve. no stenosis of the aortic valve.  5. The aortic root and ascending aorta are normal in size and structure.  6. The IVC was normal in size. No complete TR doppler jet so unable to estimate PA systolic pressure.  Patient Profile     58 y.o. male prior history of hypertension hyperlipidemia who developed chest pain late last week. He was admitted by Dr. Radford Pax where his EKG showed acute anterior septal myocardial infarction. He was taken urgently to the Cath Lab by Dr. Lendon Collar an occluded LAD in the midportion which appeared to be subacute with minimal collaterals and an EF of 20 to 25% with severe anteroapical hypokinesia.  Assessment & Plan    1. Anterior STEMI:Cardiac catheterization performed by Dr. Kathi Der an occluded mid LAD without significant collaterals and severe anteroapical hypokinesia. Unclear when symptoms began. Given presentation, placed on DAPT with ASA/plavix, BB and spiro. No recurrent chest pain since admission. Working with cardiac rehab. Discuss with MD about dropping ASA given the need for coumadin with thrombus.  2. Ischemic cardiomyopathy:EF in the 25%  range with what appears to be LV dilatation and anteroapical severe hypokinesia. No signs of volume overload on exam.  -- tolerating low dose BB, and spiro. No room top titrate with soft blood pressure  3: Essential hypertension-stable with current therapy  4: Hyperlipidemia-on high dose statin now. LDL 87, Trig 198. Recheck in 6 weeks.   5. NSVT: short run on telemetry. On low dose BB  6. Apical Thrombus: Echo with contrast did not show LV thrombus but follow up cardiac MRI confirms. Official read pending but discussed with Dr. Meda Coffee. Start coumadin per PharmD today.  For questions or updates, please contact [place] Please consult [email or web address] for contact info under   Signed, Reino Bellis, NP  05/09/2018, 8:41 AM    Agree with note by Reino Bellis NP-C  Cardiac MRI apparently showed apical mural thrombus.  Based on this we will start Coumadin anticoagulation.  He remains on IV heparin.  Otherwise he is stable.  Discharge home once INR is greater than or equal to 2.  Lorretta Harp, M.D., [place], [facility], [place] [address] [place],   [address]  [phone] 05/09/2018 10:16 AM

## 2018-05-09 NOTE — Progress Notes (Addendum)
ANTICOAGULATION CONSULT NOTE - Follow Up Consult  Pharmacy Consult for heparin and new start coumadin Indication: r/o LV thrombus  Labs: Recent Labs    05/06/18 2114 05/06/18 2143 05/07/18 0040 05/07/18 0654 05/07/18 1058 05/07/18 1817  05/08/18 0359 05/08/18 1132 05/08/18 1921 05/09/18 0401  HGB 15.9  --  15.4  --   --   --   --  14.9  --   --   --   HCT 44.4  --  43.3  --   --   --   --  41.3  --   --   --   PLT 274  --  261  --   --   --   --  304  --   --   --   APTT  --  31 39*  --   --   --   --   --   --   --   --   LABPROT  --  15.2 15.5*  --   --   --   --   --   --   --   --   INR  --  1.2 1.2  --   --   --   --   --   --   --   --   HEPARINUNFRC  --   --   --   --  <0.10*  --    < > 0.22* 0.37 0.29* 0.38  CREATININE 1.84*  --  1.64* 1.42*  --  1.44*  --  1.19  --   --  1.14  TROPONINI  --   --  24.38* 24.03* 28.00*  --   --   --   --   --   --    < > = values in this interval not displayed.    Assessment: 58yo male remains on heparin for management of possible LV thrombus. LV thrombus confirmed on cardiac MRI. Pharmacy consulted to start warfarin.   Heparin therapeutic at 0.38 today. CBCs stable. No s/sx of bleeding per nurse.   Goal of Therapy:  Heparin level 0.3-0.7 units/ml INR goal 2-3   Plan:  Will continue heparin drip at 2000 units/hr Start warfarin 5 mg x1 dose today Will check heparin level with AM labs tomorrow Monitor daily heparin level, INR, CBCs, and s/sx of bleeding  Gwenlyn Found, Sherian Rein D PGY1 Pharmacy Resident  Phone [phone] 05/09/2018   8:44 AM

## 2018-05-09 NOTE — Progress Notes (Signed)
CARDIAC REHAB PHASE I   Offered to walk with pt. Pt states he has walked the entire hallway today with improving SOB. Pt states he will walk again later today. Pt and wife deny questions or concerns. Will continue to follow and encourage mobility.   [id number] Rufina Falco, RN BSN 05/09/2018 2:54 PM

## 2018-05-09 NOTE — Progress Notes (Signed)
Notified by CCMD pt has 8bts NSVT. Pt asymptomatic. See saved strips. Jessie Foot, RN

## 2018-05-10 DIAGNOSIS — I2129 ST elevation (STEMI) myocardial infarction involving other sites: Secondary | ICD-10-CM

## 2018-05-10 LAB — CBC
HCT: 39.9 % (ref 39.0–52.0)
Hemoglobin: 14.3 g/dL (ref 13.0–17.0)
MCH: 28.2 pg (ref 26.0–34.0)
MCHC: 35.8 g/dL (ref 30.0–36.0)
MCV: 78.7 fL — ABNORMAL LOW (ref 80.0–100.0)
Platelets: 372 10*3/uL (ref 150–400)
RBC: 5.07 MIL/uL (ref 4.22–5.81)
RDW: 13.4 % (ref 11.5–15.5)
WBC: 11.1 10*3/uL — ABNORMAL HIGH (ref 4.0–10.5)
nRBC: 0 % (ref 0.0–0.2)

## 2018-05-10 LAB — HEPARIN LEVEL (UNFRACTIONATED)
Heparin Unfractionated: 0.41 IU/mL (ref 0.30–0.70)
Heparin Unfractionated: 0.66 IU/mL (ref 0.30–0.70)

## 2018-05-10 LAB — PROTIME-INR
INR: 1.2 (ref 0.8–1.2)
Prothrombin Time: 15.3 seconds — ABNORMAL HIGH (ref 11.4–15.2)

## 2018-05-10 MED ORDER — WARFARIN SODIUM 5 MG PO TABS: 5.0000 mg | ORAL_TABLET | Freq: Once | ORAL | Status: DC

## 2018-05-10 MED ORDER — CARVEDILOL 6.25 MG PO TABS
6.2500 mg | ORAL_TABLET | Freq: Two times a day (BID) | ORAL | Status: DC
  Administered 2018-05-10 – 2018-05-14 (×9): 6.25 mg via ORAL
  Filled 2018-05-10 (×9): qty 1

## 2018-05-10 MED ORDER — WARFARIN SODIUM 7.5 MG PO TABS
7.5000 mg | ORAL_TABLET | Freq: Once | ORAL | Status: AC
  Administered 2018-05-10: 7.5 mg via ORAL
  Filled 2018-05-10: qty 1

## 2018-05-10 MED FILL — Iohexol IV Soln 350 MG/ML: INTRAVENOUS | Qty: 125 | Status: AC

## 2018-05-10 NOTE — Progress Notes (Addendum)
Progress Note  Patient Name: Caleb Thomas Date of Encounter: 05/10/2018  Primary Cardiologist: No primary care provider on file.   Subjective   Feeling well this morning.   Inpatient Medications    Scheduled Meds: . aspirin EC  81 mg Oral Daily  . atorvastatin  80 mg Oral q1800  . carvedilol  6.25 mg Oral BID WC  . clopidogrel  75 mg Oral Q breakfast  . sodium chloride flush  3 mL Intravenous Q12H  . spironolactone  12.5 mg Oral Daily  . warfarin  7.5 mg Oral ONCE-1800  . Warfarin - Pharmacist Dosing Inpatient   Does not apply q1800   Continuous Infusions: . sodium chloride 20 mL/hr at 05/07/18 1400  . sodium chloride    . heparin 2,000 Units/hr (05/09/18 0415)   PRN Meds: sodium chloride, acetaminophen, ondansetron (ZOFRAN) IV, sodium chloride flush   Vital Signs    Vitals:   05/09/18 2029 05/10/18 0505 05/10/18 0853 05/10/18 1134  BP: 108/71 101/72 117/81 107/69  Pulse: 85 88  83  Resp: 18   20  Temp: 98.6 F (37 C) 99 F (37.2 C)  98.4 F (36.9 C)  TempSrc: Oral   Oral  SpO2: 96% 97%  97%  Weight:  92.8 kg    Height:        Intake/Output Summary (Last 24 hours) at 05/10/2018 1136 Last data filed at 05/10/2018 0850 Gross per 24 hour  Intake 957.96 ml  Output -  Net 957.96 ml   Last 3 Weights 05/10/2018 05/09/2018 05/08/2018  Weight (lbs) 204 lb 8 oz 205 lb 204 lb 9.6 oz  Weight (kg) 92.761 kg 92.987 kg 92.806 kg      Telemetry    SR - Personally Reviewed  ECG    N/a - Personally Reviewed  Physical Exam   GEN: No acute distress.   Neck: No JVD Cardiac: RRR, no murmurs, rubs, or gallops.  Respiratory: Clear to auscultation bilaterally. GI: Soft, nontender, non-distended  MS: No edema; No deformity. Neuro:  Nonfocal  Psych: Normal affect   Labs    Chemistry Recent Labs  Lab 05/07/18 0040  05/07/18 1817 05/08/18 0359 05/09/18 0401  NA 134*   < > 133* 133* 137  K 2.9*   < > 3.9 3.7 4.1  CL 99   < > 103 105 108  CO2 21*   < > 20*  19* 22  GLUCOSE 102*   < > 185* 107* 98  BUN 43*   < > 31* 23* 19  CREATININE 1.64*   < > 1.44* 1.19 1.14  CALCIUM 8.1*   < > 7.9* 8.0* 8.2*  PROT 7.3  --   --   --   --   ALBUMIN 2.5*  --   --   --   --   AST 61*  --   --   --   --   ALT 53*  --   --   --   --   ALKPHOS 52  --   --   --   --   BILITOT 1.1  --   --   --   --   GFRNONAA 45*   < > 52* >60 >60  GFRAA 52*   < > >60 >60 >60  ANIONGAP 14   < > 10 9 7    < > = values in this interval not displayed.     Hematology Recent Labs  Lab 05/07/18 0040 05/08/18 0359 05/10/18  0507  WBC 14.5* 10.5 11.1*  RBC 5.57 5.36 5.07  HGB 15.4 14.9 14.3  HCT 43.3 41.3 39.9  MCV 77.7* 77.1* 78.7*  MCH 27.6 27.8 28.2  MCHC 35.6 36.1* 35.8  RDW 12.8 12.8 13.4  PLT 261 304 372    Cardiac Enzymes Recent Labs  Lab 05/07/18 0040 05/07/18 0654 05/07/18 1058  TROPONINI 24.38* 24.03* 28.00*    Recent Labs  Lab 05/06/18 2118  TROPIPOC 16.81*     BNPNo results for input(s): BNP, PROBNP in the last 168 hours.   DDimer No results for input(s): DDIMER in the last 168 hours.   Radiology    Mr Cardiac Morphology W Wo Contrast  Result Date: 05/09/2018 CLINICAL DATA:  58 year old male post STEMI on 05/06/2018 with suspicion for an apical thrombus on the echocardiogram. EXAM: CARDIAC MRI TECHNIQUE: The patient was scanned on a 1.5 Tesla GE magnet. A dedicated cardiac coil was used. Functional imaging was done using Fiesta sequences. 2,3, and 4 chamber views were done to assess for RWMA's. Modified Simpson's rule using a short axis stack was used to calculate an ejection fraction on a dedicated work Conservation officer, nature. The patient received 10 cc of Gadavist. After 10 minutes inversion recovery sequences were used to assess for infiltration and scar tissue. CONTRAST:  10 cc  of Gadavist FINDINGS: 1. Normal left ventricular size, thickness and systolic function (LVEF = 38%). There are akinesis of the mid anterior, anteroseptal,  inferoseptal and apical anterior and septal walls. There is endocardial late gadolinium enhancement in the mid anterior, anteroseptal, inferoseptal and apical anterior and septal walls with 75-100% transmurality and signs of reperfusion. There is also severe edema in those segments. A large multilobular thrombus measuring 26 x 19 x 16 mm is present in the left ventricular apex. LVEDD: 57 mm LVESD: 41 mm LVEDV: 158 ml LVESV: 99 ml SV: 60 ml CO: 5.6 L/min Myocardial mass: 189 g 2. Normal right ventricular size, thickness and systolic function (LVEF = 50%). There are no regional wall motion abnormalities. 3. Mildly dilated left atrium. Normal right atrial size. 4. Normal size of the aortic root, ascending aorta. Mildly dilated pulmonary artery measuring 34 mm. 5. Mild mitral and tricuspid regurgitation. 6. Normal pericardium.  No pericardial effusion. IMPRESSION: 1. Normal left ventricular size, thickness and systolic function (LVEF = 38%). There are akinesis of the mid anterior, anteroseptal, inferoseptal and apical anterior and septal walls. There is endocardial late gadolinium enhancement in the mid anterior, anteroseptal, inferoseptal and apical anterior and septal walls with 75-100% trans-murality and signs of reperfusion. There is also severe edema in those segments. A large multilobular thrombus measuring 26 x 19 x 16 mm is present in the left ventricular apex. 2. Normal right ventricular size, thickness and systolic function (LVEF = 50%). There are no regional wall motion abnormalities. 3. Mildly dilated left atrium. Normal right atrial size. 4. Normal size of the aortic root, ascending aorta. Mildly dilated pulmonary artery measuring 34 mm. 5. Mild mitral and tricuspid regurgitation. 6. Normal pericardium.  No pericardial effusion. A repeat MRI or echocardiogram is recommended as significant late gadolinium enhancement might represent severe edema. An anticoagulation is recommended for the apical thrombus.  Electronically Signed   By: Ena Dawley   On: 05/09/2018 14:09    Cardiac Studies   Cath: 05/06/2018  Conclusions: 1. Severe single-vessel CAD with occluded mid LAD and faint left-to-left collaterals. 2. Severely reduced LVEF with anterior and apical akinesis; question LV apical thrombus. 3.  Mildly elevated left ventricular filling pressure (LVEDP 15-20 mmHg).  Recommendations: 1. Given onset of symptoms 3-4 days ago and lack of ongoing chest pain, I recommend medical therapy, including 12 months of dual antiplatelet therapy with aspirin and clopidogrel. 2. Obtain echo with Definity to assess for LV apical thrombus. I will restart heparin infusion 2 hours after TR band deflation pending echo results. 3. Aggressive secondary prevention. 4. Given mildly elevated LVEDP and acute kidney injury, maintain net even fluid balance. May need to consider gentle diuresis if dyspnea develops.  [name], MD  Echo: 05/07/2018  IMPRESSIONS   1. The left ventricle has a visually estimated ejection fraction of of 30%. The cavity size was normal. There is mildly increased left ventricular wall thickness. Left ventricular diastolic Doppler parameters are consistent with impaired relaxation.  Akinesis of the mid to apical anteroseptal, anterior, and inferoseptal walls, the apical inferior wall, and the true apex. There appeared to be an LV apical thrombus on non-contrast enhanced images. On images with Definity, surprisingly this was not  confirmed. Would treat for LV thrombus but would also confirm with cardiac MRI. 2. The right ventricle has normal systolic function. The cavity was normal. There is no increase in right ventricular wall thickness. 3. No evidence of mitral valve stenosis. No mitral regurgitation. 4. The aortic valve is tricuspid Mild calcification of the aortic valve. no stenosis of the aortic valve. 5. The aortic root and ascending aorta are normal in size and  structure. 6. The IVC was normal in size. No complete TR doppler jet so unable to estimate PA systolic pressure.  Patient Profile     58 y.o. male prior history of hypertension hyperlipidemia who developed chest pain late last week. He was admitted by Dr. Radford Pax where his EKG showed acute anterior septal myocardial infarction. He was taken urgently to the Cath Lab by Dr. Lendon Collar an occluded LAD in the midportion which appeared to be subacute with minimal collaterals and an EF of 20 to 25% with severe anteroapical hypokinesia.  Assessment & Plan    1.Anterior STEMI:Cardiac catheterization performed by Dr. Kathi Der an occluded mid LAD without significant collaterals and severe anteroapical hypokinesia.Unclear when symptoms began. Given presentation, placed on DAPT with ASA/plavix, BB and spiro. No recurrent chest pain since admission. Working with cardiac rehab.   2.Ischemic cardiomyopathy:EF in the 25% range with what appears to be LV dilatation and anteroapical severe hypokinesia. No signs of volume overload on exam. -- tolerating low dose BB will increase today, and spiro.  3: Essential hypertension-stable with current therapy  4: Hyperlipidemia-on high dose statin now. LDL 87, Trig 198. Recheck in 6 weeks.  5. NSVT: short run on telemetry. On low dose BB  6. Apical Thrombus: Echo with contrast did not show LV thrombus but follow up cardiac MRI confirms. -- now on IV heparin and coumadin per PharmD. INR goal 2  For questions or updates, please contact [place] Please consult [email or web address] for contact info under    Signed, Reino Bellis, NP  05/10/2018, 11:36 AM    Agree with note by Reino Bellis NP-C  Heart stable. On IV hep and coumadin AC for LV mural thrombus. INR 1.2. Home when INR > 2.0  Lorretta Harp, M.D., [place], [facility], [place], [place] [address] [place], [place]  [address]   [phone] 05/10/2018 12:03 PM

## 2018-05-10 NOTE — Progress Notes (Addendum)
ANTICOAGULATION CONSULT NOTE - Follow Up Consult  Pharmacy Consult for heparin and new start coumadin Indication: r/o LV thrombus  Labs: Recent Labs    05/07/18 0654 05/07/18 1058 05/07/18 1817  05/08/18 0359 05/08/18 1132 05/08/18 1921 05/09/18 0401 05/09/18 0926  HGB  --   --   --   --  14.9  --   --   --   --   HCT  --   --   --   --  41.3  --   --   --   --   PLT  --   --   --   --  304  --   --   --   --   LABPROT  --   --   --   --   --   --   --   --  15.2  INR  --   --   --   --   --   --   --   --  1.2  HEPARINUNFRC  --  <0.10*  --    < > 0.22* 0.37 0.29* 0.38  --   CREATININE 1.42*  --  1.44*  --  1.19  --   --  1.14  --   TROPONINI 24.03* 28.00*  --   --   --   --   --   --   --    < > = values in this interval not displayed.    Assessment: 58yo male remains on heparin for management of possible LV thrombus. LV thrombus confirmed on cardiac MRI. Pharmacy consulted to start warfarin.   Heparin therapeutic at 0.41 today. Patient has received one dose of warfarin. Level today are subtherapeutic at 1.2. CBCs stable. No s/sx of bleeding documented.   Goal of Therapy:  Heparin level 0.3-0.7 units/ml INR goal 2-3   Plan:  Will continue heparin drip at 2000 units/hr Warfarin 7.5 mg x1 dose today Monitor daily heparin level, INR, CBCs, and s/sx of bleeding  Gwenlyn Found, Sherian Rein D PGY1 Pharmacy Resident  Phone [phone] 05/10/2018   6:47 AM

## 2018-05-10 NOTE — Plan of Care (Signed)
  Problem: Education: Goal: Understanding of CV disease, CV risk reduction, and recovery process will improve Outcome: Progressing   Problem: Cardiovascular: Goal: Vascular access site(s) Level 0-1 will be maintained Outcome: Progressing

## 2018-05-10 NOTE — Progress Notes (Signed)
1520 Pt has walked several times today. Pt is going a little farther every day. Graylon Good RN BSN 05/10/2018 3:22 PM

## 2018-05-10 NOTE — Progress Notes (Signed)
ANTICOAGULATION CONSULT NOTE - Follow Up Consult  Pharmacy Consult for heparin and new start coumadin Indication: r/o LV thrombus  Labs: Recent Labs    05/08/18 0359  05/09/18 0401 05/09/18 0926 05/10/18 0507 05/10/18 1714  HGB 14.9  --   --   --  14.3  --   HCT 41.3  --   --   --  39.9  --   PLT 304  --   --   --  372  --   LABPROT  --   --   --  15.2 15.3*  --   INR  --   --   --  1.2 1.2  --   HEPARINUNFRC 0.22*   < > 0.38  --  0.41 0.66  CREATININE 1.19  --  1.14  --   --   --    < > = values in this interval not displayed.    Assessment: 58yo male remains on heparin for management of possible LV thrombus. LV thrombus confirmed on cardiac MRI. Pharmacy consulted to start warfarin.   RN called to inform that patient was experiencing epistaxis this evening which seems to have resolved to application of pressure. A stat heparin level this evening is on the upper end of normal on 2000 units/h of IV heparin  Goal of Therapy:  Heparin level 0.3-0.7 units/ml INR goal 2-3   Plan:  Decrease heparin level slightly to 1950 units/hr  Monitor daily heparin level, INR, CBCs, and s/sx of bleeding  Albertina Parr, PharmD., BCPS Clinical Pharmacist Clinical phone for 05/10/18 until 10:30pm: [phone] If after 10:30pm, please refer to [facility] for unit-specific pharmacist

## 2018-05-11 LAB — CBC
HEMATOCRIT: 39 % (ref 39.0–52.0)
Hemoglobin: 14.4 g/dL (ref 13.0–17.0)
MCH: 28.9 pg (ref 26.0–34.0)
MCHC: 36.9 g/dL — ABNORMAL HIGH (ref 30.0–36.0)
MCV: 78.3 fL — ABNORMAL LOW (ref 80.0–100.0)
Platelets: 375 10*3/uL (ref 150–400)
RBC: 4.98 MIL/uL (ref 4.22–5.81)
RDW: 13.1 % (ref 11.5–15.5)
WBC: 10.2 10*3/uL (ref 4.0–10.5)
nRBC: 0 % (ref 0.0–0.2)

## 2018-05-11 LAB — BASIC METABOLIC PANEL
Anion gap: 9 (ref 5–15)
BUN: 14 mg/dL (ref 6–20)
CO2: 20 mmol/L — ABNORMAL LOW (ref 22–32)
Calcium: 8.4 mg/dL — ABNORMAL LOW (ref 8.9–10.3)
Chloride: 108 mmol/L (ref 98–111)
Creatinine, Ser: 1.22 mg/dL (ref 0.61–1.24)
GFR calc Af Amer: >60 mL/min (ref >60)
GFR calc non Af Amer: >60 mL/min (ref >60)
Glucose, Bld: 104 mg/dL — ABNORMAL HIGH (ref 70–99)
Potassium: 4.3 mmol/L (ref 3.5–5.1)
Sodium: 137 mmol/L (ref 135–145)

## 2018-05-11 LAB — HEPARIN LEVEL (UNFRACTIONATED): Heparin Unfractionated: 0.34 IU/mL (ref 0.30–0.70)

## 2018-05-11 LAB — PROTIME-INR
INR: 1.2 (ref 0.8–1.2)
Prothrombin Time: 15 seconds (ref 11.4–15.2)

## 2018-05-11 MED ORDER — WARFARIN SODIUM 7.5 MG PO TABS
7.5000 mg | ORAL_TABLET | Freq: Once | ORAL | Status: AC
  Administered 2018-05-11: 7.5 mg via ORAL
  Filled 2018-05-11: qty 1

## 2018-05-11 NOTE — Progress Notes (Signed)
CARDIAC REHAB PHASE I   Offered to walk with pt. Pt states he is ambulating independently with increasing distance and speed. Pt denies CP or SOB. Pt states he feels good and is just waiting on his INR to go home. Pt and wife deny questions or concerns. Will follow-up on Monday if pt is still in hospital. Pt referred to [facility] [place].  [phone] Rufina [place]co, RN BSN 05/11/2018 11:04 AM

## 2018-05-11 NOTE — Progress Notes (Signed)
ANTICOAGULATION CONSULT NOTE - Follow Up Consult  Pharmacy Consult for heparin and new start coumadin Indication: r/o LV thrombus  Labs: Recent Labs    05/09/18 0401 05/09/18 0926 05/10/18 0507 05/10/18 1714 05/11/18 0417  HGB  --   --  14.3  --  14.4  HCT  --   --  39.9  --  39.0  PLT  --   --  372  --  375  LABPROT  --  15.2 15.3*  --  15.0  INR  --  1.2 1.2  --  1.2  HEPARINUNFRC 0.38  --  0.41 0.66 0.34  CREATININE 1.14  --   --   --  1.22    Assessment: 58yo male remains on heparin for management of possible LV thrombus. LV thrombus confirmed on cardiac MRI. Pharmacy consulted to start warfarin.   Heparin therapeutic at 0.34 today. INR subtherapeutic as expected after only two doses. Pt reported epistaxis last night which resolved with pressure, H/H stable.   Goal of Therapy:  Heparin level 0.3-0.7 units/ml INR goal 2-3   Plan:  -Continue heparin 1950 units/hr -Warfarin 7.5mg  PO x1 tonight -Daily INR, heparin level, CBC  Arrie Senate, PharmD, BCPS Clinical Pharmacist [phone] Please check AMION for all [place] numbers 05/11/2018

## 2018-05-11 NOTE — Progress Notes (Signed)
Progress Note  Patient Name: Caleb Thomas Date of Encounter: 05/11/2018  Primary Cardiologist: Dr. Fransico Him  Subjective   Feeling well this morning.   Inpatient Medications    Scheduled Meds: . aspirin EC  81 mg Oral Daily  . atorvastatin  80 mg Oral q1800  . carvedilol  6.25 mg Oral BID WC  . clopidogrel  75 mg Oral Q breakfast  . sodium chloride flush  3 mL Intravenous Q12H  . spironolactone  12.5 mg Oral Daily  . Warfarin - Pharmacist Dosing Inpatient   Does not apply q1800   Continuous Infusions: . sodium chloride 10 mL/hr (05/10/18 2231)  . sodium chloride    . heparin 1,950 Units/hr (05/11/18 0908)   PRN Meds: sodium chloride, acetaminophen, ondansetron (ZOFRAN) IV, sodium chloride flush   Vital Signs    Vitals:   05/10/18 0853 05/10/18 1134 05/10/18 2204 05/11/18 0445  BP: 117/81 107/69 103/71 107/70  Pulse:  83 75 79  Resp:  20    Temp:  98.4 F (36.9 C) 98.8 F (37.1 C) 98.3 F (36.8 C)  TempSrc:  Oral Oral Oral  SpO2:  97% 97% 98%  Weight:    92.9 kg  Height:        Intake/Output Summary (Last 24 hours) at 05/11/2018 0914 Last data filed at 05/10/2018 2359 Gross per 24 hour  Intake 856.87 ml  Output -  Net 856.87 ml   Last 3 Weights 05/11/2018 05/10/2018 05/09/2018  Weight (lbs) 204 lb 14.4 oz 204 lb 8 oz 205 lb  Weight (kg) 92.942 kg 92.761 kg 92.987 kg      Telemetry    SR - Personally Reviewed  ECG    N/a - Personally Reviewed  Physical Exam   GEN: No acute distress.   Neck: No JVD Cardiac: RRR, no murmurs, rubs, or gallops.  Respiratory: Clear to auscultation bilaterally. GI: Soft, nontender, non-distended  MS: No edema; No deformity. Neuro:  Nonfocal  Psych: Normal affect   Labs    Chemistry Recent Labs  Lab 05/07/18 0040  05/08/18 0359 05/09/18 0401 05/11/18 0417  NA 134*   < > 133* 137 137  K 2.9*   < > 3.7 4.1 4.3  CL 99   < > 105 108 108  CO2 21*   < > 19* 22 20*  GLUCOSE 102*   < > 107* 98 104*  BUN 43*    < > 23* 19 14  CREATININE 1.64*   < > 1.19 1.14 1.22  CALCIUM 8.1*   < > 8.0* 8.2* 8.4*  PROT 7.3  --   --   --   --   ALBUMIN 2.5*  --   --   --   --   AST 61*  --   --   --   --   ALT 53*  --   --   --   --   ALKPHOS 52  --   --   --   --   BILITOT 1.1  --   --   --   --   GFRNONAA 45*   < > >60 >60 >60  GFRAA 52*   < > >60 >60 >60  ANIONGAP 14   < > 9 7 9    < > = values in this interval not displayed.     Hematology Recent Labs  Lab 05/08/18 0359 05/10/18 0507 05/11/18 0417  WBC 10.5 11.1* 10.2  RBC 5.36 5.07 4.98  HGB  14.9 14.3 14.4  HCT 41.3 39.9 39.0  MCV 77.1* 78.7* 78.3*  MCH 27.8 28.2 28.9  MCHC 36.1* 35.8 36.9*  RDW 12.8 13.4 13.1  PLT 304 372 375    Cardiac Enzymes Recent Labs  Lab 05/07/18 0040 05/07/18 0654 05/07/18 1058  TROPONINI 24.38* 24.03* 28.00*    Recent Labs  Lab 05/06/18 2118  TROPIPOC 16.81*     BNPNo results for input(s): BNP, PROBNP in the last 168 hours.   DDimer No results for input(s): DDIMER in the last 168 hours.   Radiology    No results found.  Cardiac Studies   Cath: 05/06/2018  Conclusions: 1. Severe single-vessel CAD with occluded mid LAD and faint left-to-left collaterals. 2. Severely reduced LVEF with anterior and apical akinesis; question LV apical thrombus. 3. Mildly elevated left ventricular filling pressure (LVEDP 15-20 mmHg).  Recommendations: 1. Given onset of symptoms 3-4 days ago and lack of ongoing chest pain, I recommend medical therapy, including 12 months of dual antiplatelet therapy with aspirin and clopidogrel. 2. Obtain echo with Definity to assess for LV apical thrombus. I will restart heparin infusion 2 hours after TR band deflation pending echo results. 3. Aggressive secondary prevention. 4. Given mildly elevated LVEDP and acute kidney injury, maintain net even fluid balance. May need to consider gentle diuresis if dyspnea develops.  [name], MD  Echo: 05/07/2018  IMPRESSIONS    1. The left ventricle has a visually estimated ejection fraction of of 30%. The cavity size was normal. There is mildly increased left ventricular wall thickness. Left ventricular diastolic Doppler parameters are consistent with impaired relaxation.  Akinesis of the mid to apical anteroseptal, anterior, and inferoseptal walls, the apical inferior wall, and the true apex. There appeared to be an LV apical thrombus on non-contrast enhanced images. On images with Definity, surprisingly this was not  confirmed. Would treat for LV thrombus but would also confirm with cardiac MRI. 2. The right ventricle has normal systolic function. The cavity was normal. There is no increase in right ventricular wall thickness. 3. No evidence of mitral valve stenosis. No mitral regurgitation. 4. The aortic valve is tricuspid Mild calcification of the aortic valve. no stenosis of the aortic valve. 5. The aortic root and ascending aorta are normal in size and structure. 6. The IVC was normal in size. No complete TR doppler jet so unable to estimate PA systolic pressure.  Patient Profile     58 y.o. male prior history of hypertension hyperlipidemia who developed chest pain late last week. He was admitted by Dr. Radford Pax where his EKG showed acute anterior septal myocardial infarction. He was taken urgently to the Cath Lab by Dr. Lendon Collar an occluded LAD in the midportion which appeared to be subacute with minimal collaterals and an EF of 20 to 25% with severe anteroapical hypokinesia.  Assessment & Plan    1.Anterior STEMI:Cardiac catheterization performed by Dr. Kathi Der an occluded mid LAD without significant collaterals and severe anteroapical hypokinesia.Unclear when symptoms began. Given presentation, placed on DAPT with ASA/plavix, BB and spiro. No recurrent chest pain since admission. Working with cardiac rehab.   2.Ischemic cardiomyopathy:EF in the 25% range with what appears to be LV  dilatation and anteroapical severe hypokinesia. No signs of volume overload on exam. -- tolerating low dose BB will increase today, and spiro.  3: Essential hypertension-stable with current therapy  4: Hyperlipidemia-on high dose statin now. LDL 87, Trig 198. Recheck in 6 weeks.  5. NSVT: short run on telemetry. On  low dose BB  6. Apical Thrombus: Echo with contrast did not show LV thrombus but follow up cardiac MRI confirms. -- now on IV heparin and coumadin per PharmD. INR goal 2.  INR today remains at 1.2 despite increasing Coumadin dose from 5 to 7.5 mg yesterday.  We will continue to follow.  For questions or updates, please contact [place] Please consult [email or web address] for contact info under    Lorretta Harp, M.D., [place], [facility], [place], [place] [address] [place], [place]  [address]  [phone] 05/11/2018 9:17 AM

## 2018-05-11 NOTE — Plan of Care (Signed)
  Problem: Cardiovascular: Goal: Vascular access site(s) Level 0-1 will be maintained Outcome: Completed/Met

## 2018-05-12 DIAGNOSIS — I519 Heart disease, unspecified: Secondary | ICD-10-CM

## 2018-05-12 DIAGNOSIS — E785 Hyperlipidemia, unspecified: Secondary | ICD-10-CM

## 2018-05-12 DIAGNOSIS — I472 Ventricular tachycardia: Secondary | ICD-10-CM

## 2018-05-12 LAB — HEPARIN LEVEL (UNFRACTIONATED): Heparin Unfractionated: 0.53 IU/mL (ref 0.30–0.70)

## 2018-05-12 LAB — CBC
HCT: 40.3 % (ref 39.0–52.0)
Hemoglobin: 14.6 g/dL (ref 13.0–17.0)
MCH: 28.6 pg (ref 26.0–34.0)
MCHC: 36.2 g/dL — AB (ref 30.0–36.0)
MCV: 78.9 fL — ABNORMAL LOW (ref 80.0–100.0)
Platelets: 419 10*3/uL — ABNORMAL HIGH (ref 150–400)
RBC: 5.11 MIL/uL (ref 4.22–5.81)
RDW: 13.3 % (ref 11.5–15.5)
WBC: 9.2 10*3/uL (ref 4.0–10.5)
nRBC: 0 % (ref 0.0–0.2)

## 2018-05-12 LAB — MAGNESIUM: Magnesium: 1.8 mg/dL (ref 1.7–2.4)

## 2018-05-12 LAB — BASIC METABOLIC PANEL
Anion gap: 9 (ref 5–15)
BUN: 14 mg/dL (ref 6–20)
CO2: 20 mmol/L — ABNORMAL LOW (ref 22–32)
Calcium: 8.5 mg/dL — ABNORMAL LOW (ref 8.9–10.3)
Chloride: 107 mmol/L (ref 98–111)
Creatinine, Ser: 1.07 mg/dL (ref 0.61–1.24)
GFR calc Af Amer: >60 mL/min (ref >60)
GFR calc non Af Amer: >60 mL/min (ref >60)
Glucose, Bld: 104 mg/dL — ABNORMAL HIGH (ref 70–99)
POTASSIUM: 4 mmol/L (ref 3.5–5.1)
Sodium: 136 mmol/L (ref 135–145)

## 2018-05-12 LAB — PROTIME-INR
INR: 1.4 — ABNORMAL HIGH (ref 0.8–1.2)
Prothrombin Time: 17.2 seconds — ABNORMAL HIGH (ref 11.4–15.2)

## 2018-05-12 MED ORDER — WARFARIN SODIUM 10 MG PO TABS
10.0000 mg | ORAL_TABLET | Freq: Once | ORAL | Status: AC
  Administered 2018-05-12: 10 mg via ORAL
  Filled 2018-05-12: qty 1

## 2018-05-12 NOTE — Progress Notes (Signed)
Progress Note  Patient Name: Caleb Thomas Date of Encounter: 05/12/2018  Primary Cardiologist: Dr. Fransico Him  Subjective   Doing well. Denies chest pain, palpitations, and shortness of breath. Just came from walking with cardiac rehab.  Inpatient Medications    Scheduled Meds: . aspirin EC  81 mg Oral Daily  . atorvastatin  80 mg Oral q1800  . carvedilol  6.25 mg Oral BID WC  . clopidogrel  75 mg Oral Q breakfast  . sodium chloride flush  3 mL Intravenous Q12H  . spironolactone  12.5 mg Oral Daily  . Warfarin - Pharmacist Dosing Inpatient   Does not apply q1800   Continuous Infusions: . sodium chloride 10 mL/hr (05/10/18 2231)  . sodium chloride    . heparin 1,950 Units/hr (05/11/18 2300)   PRN Meds: sodium chloride, acetaminophen, ondansetron (ZOFRAN) IV, sodium chloride flush   Vital Signs    Vitals:   05/11/18 0445 05/11/18 1341 05/11/18 2114 05/12/18 0652  BP: 107/70 107/77 107/73 97/73  Pulse: 79 77 79 71  Resp:   20 20  Temp: 98.3 F (36.8 C) 98 F (36.7 C) 99 F (37.2 C) 97.9 F (36.6 C)  TempSrc: Oral Oral Oral Oral  SpO2: 98% 100% 99% 97%  Weight: 92.9 kg   91.3 kg  Height:        Intake/Output Summary (Last 24 hours) at 05/12/2018 1023 Last data filed at 05/11/2018 2300 Gross per 24 hour  Intake 919.32 ml  Output -  Net 919.32 ml   Filed Weights   05/10/18 0505 05/11/18 0445 05/12/18 0652  Weight: 92.8 kg 92.9 kg 91.3 kg    Telemetry    NSR, frequent PVC's, 22 beat run of NSVT at 1859 on 3/13 - Personally Reviewed  ECG    NA - Personally Reviewed  Physical Exam   GEN: No acute distress.   Neck: No JVD Cardiac: RRR, no murmurs, rubs, or gallops.  Respiratory: Clear to auscultation bilaterally. GI: Soft, nontender, non-distended  MS: No edema; No deformity. Neuro:  Nonfocal  Psych: Normal affect   Labs    Chemistry Recent Labs  Lab 05/07/18 0040  05/09/18 0401 05/11/18 0417 05/12/18 0306  NA 134*   < > 137 137 136  K  2.9*   < > 4.1 4.3 4.0  CL 99   < > 108 108 107  CO2 21*   < > 22 20* 20*  GLUCOSE 102*   < > 98 104* 104*  BUN 43*   < > 19 14 14   CREATININE 1.64*   < > 1.14 1.22 1.07  CALCIUM 8.1*   < > 8.2* 8.4* 8.5*  PROT 7.3  --   --   --   --   ALBUMIN 2.5*  --   --   --   --   AST 61*  --   --   --   --   ALT 53*  --   --   --   --   ALKPHOS 52  --   --   --   --   BILITOT 1.1  --   --   --   --   GFRNONAA 45*   < > >60 >60 >60  GFRAA 52*   < > >60 >60 >60  ANIONGAP 14   < > 7 9 9    < > = values in this interval not displayed.     Hematology Recent Labs  Lab 05/10/18 0507 05/11/18  2952 05/12/18 0306  WBC 11.1* 10.2 9.2  RBC 5.07 4.98 5.11  HGB 14.3 14.4 14.6  HCT 39.9 39.0 40.3  MCV 78.7* 78.3* 78.9*  MCH 28.2 28.9 28.6  MCHC 35.8 36.9* 36.2*  RDW 13.4 13.1 13.3  PLT 372 375 419*    Cardiac Enzymes Recent Labs  Lab 05/07/18 0040 05/07/18 0654 05/07/18 1058  TROPONINI 24.38* 24.03* 28.00*    Recent Labs  Lab 05/06/18 2118  TROPIPOC 16.81*     BNPNo results for input(s): BNP, PROBNP in the last 168 hours.   DDimer No results for input(s): DDIMER in the last 168 hours.   Radiology    No results found.  Cardiac Studies   Cath: 05/06/2018  Conclusions: 1. Severe single-vessel CAD with occluded mid LAD and faint left-to-left collaterals. 2. Severely reduced LVEF with anterior and apical akinesis; question LV apical thrombus. 3. Mildly elevated left ventricular filling pressure (LVEDP 15-20 mmHg).  Recommendations: 1. Given onset of symptoms 3-4 days ago and lack of ongoing chest pain, I recommend medical therapy, including 12 months of dual antiplatelet therapy with aspirin and clopidogrel. 2. Obtain echo with Definity to assess for LV apical thrombus. I will restart heparin infusion 2 hours after TR band deflation pending echo results. 3. Aggressive secondary prevention. 4. Given mildly elevated LVEDP and acute kidney injury, maintain net even fluid balance.  May need to consider gentle diuresis if dyspnea develops.  [name], MD  Echo: 05/07/2018  IMPRESSIONS   1. The left ventricle has a visually estimated ejection fraction of of 30%. The cavity size was normal. There is mildly increased left ventricular wall thickness. Left ventricular diastolic Doppler parameters are consistent with impaired relaxation.  Akinesis of the mid to apical anteroseptal, anterior, and inferoseptal walls, the apical inferior wall, and the true apex. There appeared to be an LV apical thrombus on non-contrast enhanced images. On images with Definity, surprisingly this was not  confirmed. Would treat for LV thrombus but would also confirm with cardiac MRI. 2. The right ventricle has normal systolic function. The cavity was normal. There is no increase in right ventricular wall thickness. 3. No evidence of mitral valve stenosis. No mitral regurgitation. 4. The aortic valve is tricuspid Mild calcification of the aortic valve. no stenosis of the aortic valve. 5. The aortic root and ascending aorta are normal in size and structure. 6. The IVC was normal in size. No complete TR doppler jet so unable to estimate PA systolic pressure.   Cardiac MRI (05/08/18):  IMPRESSION: 1. Normal left ventricular size, thickness and systolic function (LVEF = 38%). There are akinesis of the mid anterior, anteroseptal, inferoseptal and apical anterior and septal walls. There is endocardial late gadolinium enhancement in the mid anterior, anteroseptal, inferoseptal and apical anterior and septal walls with 75-100% trans-murality and signs of reperfusion. There is also severe edema in those segments. A large multilobular thrombus measuring 26 x 19 x 16 mm is present in the left ventricular apex.  2. Normal right ventricular size, thickness and systolic function (LVEF = 50%). There are no regional wall motion abnormalities.  3. Mildly dilated left atrium. Normal right  atrial size.  4. Normal size of the aortic root, ascending aorta. Mildly dilated pulmonary artery measuring 34 mm.  5. Mild mitral and tricuspid regurgitation.  6. Normal pericardium.  No pericardial effusion.  A repeat MRI or echocardiogram is recommended as significant late gadolinium enhancement might represent severe edema. An anticoagulation is recommended for the apical thrombus.  Patient Profile     58 y.o. male with prior history of hypertension hyperlipidemia who developed chest pain late last week. He was admitted by Dr. Radford Pax where his EKG showed acute anterior septal myocardial infarction. He was taken urgently to the Cath Lab by Dr. Lendon Collar an occluded LAD in the midportion which appeared to be subacute with minimal collaterals and an EF of 20 to 25% with severe anteroapical hypokinesia.  Assessment & Plan    1.Anterior STEMI:Cardiac catheterization performed by Dr. Kathi Der an occluded mid LAD without significant collaterals and severe anteroapical hypokinesia.Unclear when symptoms began. Given presentation, placed on DAPT with ASA/plavix, BB and spiro. No recurrent chest pain since admission. Working with cardiac rehab.   2.Ischemic cardiomyopathy:EF in the 25% range with what appears to be LV dilatation and anteroapical severe hypokinesia. No signs of volume overload on exam. -- tolerating carvedilol and spiro. BP low normal. Consider switching spiro to Entresto in outpatient setting if BP permits.  3: Essential hypertension-low normal. Asymptomatic.  4: Hyperlipidemia-on high dose statin now. LDL 87, Trig 198. Recheck in 6 weeks.  5. NSVT: 22- beat run on telemetry on 3/13 (asymptomatic). On carvedilol. K normal. Will check Mg. I will arrange for LifeVest prior to discharge.  6. Apical Thrombus:Echo with contrastdid not show LV thrombus butfollow up cardiac MRI confirms. -- now on IV heparin and coumadin per PharmD. INR goal 2.  INR  today remains at 1.4 despite increasing Coumadin dose from 5 to 7.5 mg yesterday.  We will continue to follow.  For questions or updates, please contact [place] Please consult [email or web address] for contact info under Cardiology/STEMI.      Signed, Kate Sable, MD  05/12/2018, 10:23 AM

## 2018-05-12 NOTE — Progress Notes (Signed)
ANTICOAGULATION CONSULT NOTE - Follow Up Consult  Pharmacy Consult for heparin and new start coumadin Indication: r/o LV thrombus  Labs: Recent Labs    05/10/18 0507 05/10/18 1714 05/11/18 0417 05/12/18 0306  HGB 14.3  --  14.4 14.6  HCT 39.9  --  39.0 40.3  PLT 372  --  375 419*  LABPROT 15.3*  --  15.0 17.2*  INR 1.2  --  1.2 1.4*  HEPARINUNFRC 0.41 0.66 0.34 0.53  CREATININE  --   --  1.22 1.07    Assessment: 58yo male remains on heparin for management of possible LV thrombus. LV thrombus confirmed on cardiac MRI. Pharmacy consulted to start warfarin.  -heparin level- 0.53, INR= 1.4 (after two doses of 7.5mg )   Goal of Therapy:  Heparin level 0.3-0.7 units/ml INR goal 2-3   Plan:  -Continue heparin 1950 units/hr -Warfarin 10mg  PO x1 tonight -Daily INR, heparin level, CBC  Hildred Laser, PharmD Clinical Pharmacist **Pharmacist phone directory can now be found on [email or web address] (PW [email or web address]).  Listed under [place].

## 2018-05-13 LAB — CBC
HCT: 40.5 % (ref 39.0–52.0)
Hemoglobin: 14.3 g/dL (ref 13.0–17.0)
MCH: 27.8 pg (ref 26.0–34.0)
MCHC: 35.3 g/dL (ref 30.0–36.0)
MCV: 78.6 fL — ABNORMAL LOW (ref 80.0–100.0)
Platelets: 439 10*3/uL — ABNORMAL HIGH (ref 150–400)
RBC: 5.15 MIL/uL (ref 4.22–5.81)
RDW: 13.3 % (ref 11.5–15.5)
WBC: 8.6 10*3/uL (ref 4.0–10.5)
nRBC: 0 % (ref 0.0–0.2)

## 2018-05-13 LAB — PROTIME-INR
INR: 1.9 — ABNORMAL HIGH (ref 0.8–1.2)
Prothrombin Time: 21.1 seconds — ABNORMAL HIGH (ref 11.4–15.2)

## 2018-05-13 LAB — HEPARIN LEVEL (UNFRACTIONATED): Heparin Unfractionated: 0.63 IU/mL (ref 0.30–0.70)

## 2018-05-13 MED ORDER — MAGNESIUM OXIDE 400 (241.3 MG) MG PO TABS
400.0000 mg | ORAL_TABLET | Freq: Once | ORAL | Status: AC
  Administered 2018-05-13: 400 mg via ORAL
  Filled 2018-05-13: qty 1

## 2018-05-13 MED ORDER — WARFARIN SODIUM 5 MG PO TABS
5.0000 mg | ORAL_TABLET | Freq: Once | ORAL | Status: AC
  Administered 2018-05-13: 5 mg via ORAL
  Filled 2018-05-13: qty 1

## 2018-05-13 NOTE — Progress Notes (Signed)
Progress Note  Patient Name: [place] Brogden Date of Encounter: 05/13/2018  Primary Cardiologist: Dr. Worthy Rancher  Subjective   He is feeling well.  He walked again this morning without any exertional chest pain or dyspnea.  He denies palpitations, orthopnea, and leg swelling.  Inpatient Medications    Scheduled Meds: . aspirin EC  81 mg Oral Daily  . atorvastatin  80 mg Oral q1800  . carvedilol  6.25 mg Oral BID WC  . clopidogrel  75 mg Oral Q breakfast  . sodium chloride flush  3 mL Intravenous Q12H  . spironolactone  12.5 mg Oral Daily  . Warfarin - Pharmacist Dosing Inpatient   Does not apply q1800   Continuous Infusions: . sodium chloride 10 mL/hr at 05/12/18 1800  . sodium chloride    . heparin 1,950 Units/hr (05/13/18 0011)   PRN Meds: sodium chloride, acetaminophen, ondansetron (ZOFRAN) IV, sodium chloride flush   Vital Signs    Vitals:   05/12/18 1401 05/12/18 2125 05/13/18 0520 05/13/18 0814  BP: 96/74 (!) 95/54 95/67 96/70   Pulse: 77 74 75   Resp:  18 18   Temp: 97.9 F (36.6 C) 98.7 F (37.1 C) 98.4 F (36.9 C)   TempSrc: Oral Oral Oral   SpO2:  95% 97%   Weight:   90.9 kg   Height:        Intake/Output Summary (Last 24 hours) at 05/13/2018 1122 Last data filed at 05/13/2018 0818 Gross per 24 hour  Intake 843 ml  Output -  Net 843 ml   Filed Weights   05/11/18 0445 05/12/18 0652 05/13/18 0520  Weight: 92.9 kg 91.3 kg 90.9 kg    Telemetry    Sinus rhythm with PVCs- Personally Reviewed  ECG    NA - Personally Reviewed  Physical Exam   GEN: No acute distress.   Neck: No JVD Cardiac: RRR, no murmurs, rubs, or gallops.  Respiratory: Clear to auscultation bilaterally. GI: Soft, nontender, non-distended  MS: No edema; No deformity. Neuro:  Nonfocal  Psych: Normal affect   Labs    Chemistry Recent Labs  Lab 05/07/18 0040  05/09/18 0401 05/11/18 0417 05/12/18 0306  NA 134*   < > 137 137 136  K 2.9*   < > 4.1 4.3 4.0  CL 99   <  > 108 108 107  CO2 21*   < > 22 20* 20*  GLUCOSE 102*   < > 98 104* 104*  BUN 43*   < > 19 14 14   CREATININE 1.64*   < > 1.14 1.22 1.07  CALCIUM 8.1*   < > 8.2* 8.4* 8.5*  PROT 7.3  --   --   --   --   ALBUMIN 2.5*  --   --   --   --   AST 61*  --   --   --   --   ALT 53*  --   --   --   --   ALKPHOS 52  --   --   --   --   BILITOT 1.1  --   --   --   --   GFRNONAA 45*   < > >60 >60 >60  GFRAA 52*   < > >60 >60 >60  ANIONGAP 14   < > 7 9 9    < > = values in this interval not displayed.     Hematology Recent Labs  Lab 05/11/18 [phone] 05/12/18 0306 05/13/18 0406  WBC 10.2 9.2 8.6  RBC 4.98 5.11 5.15  HGB 14.4 14.6 14.3  HCT 39.0 40.3 40.5  MCV 78.3* 78.9* 78.6*  MCH 28.9 28.6 27.8  MCHC 36.9* 36.2* 35.3  RDW 13.1 13.3 13.3  PLT 375 419* 439*    Cardiac Enzymes Recent Labs  Lab 05/07/18 0040 05/07/18 0654 05/07/18 1058  TROPONINI 24.38* 24.03* 28.00*    Recent Labs  Lab 05/06/18 2118  TROPIPOC 16.81*     BNPNo results for input(s): BNP, PROBNP in the last 168 hours.   DDimer No results for input(s): DDIMER in the last 168 hours.   Radiology    No results found.  Cardiac Studies   Cath: 05/06/2018  Conclusions: 1. Severe single-vessel CAD with occluded mid LAD and faint left-to-left collaterals. 2. Severely reduced LVEF with anterior and apical akinesis; question LV apical thrombus. 3. Mildly elevated left ventricular filling pressure (LVEDP 15-20 mmHg).  Recommendations: 1. Given onset of symptoms 3-4 days ago and lack of ongoing chest pain, I recommend medical therapy, including 12 months of dual antiplatelet therapy with aspirin and clopidogrel. 2. Obtain echo with Definity to assess for LV apical thrombus. I will restart heparin infusion 2 hours after TR band deflation pending echo results. 3. Aggressive secondary prevention. 4. Given mildly elevated LVEDP and acute kidney injury, maintain net even fluid balance. May need to consider gentle  diuresis if dyspnea develops.  [name], MD  Echo: 05/07/2018  IMPRESSIONS   1. The left ventricle has a visually estimated ejection fraction of of 30%. The cavity size was normal. There is mildly increased left ventricular wall thickness. Left ventricular diastolic Doppler parameters are consistent with impaired relaxation.  Akinesis of the mid to apical anteroseptal, anterior, and inferoseptal walls, the apical inferior wall, and the true apex. There appeared to be an LV apical thrombus on non-contrast enhanced images. On images with Definity, surprisingly this was not  confirmed. Would treat for LV thrombus but would also confirm with cardiac MRI. 2. The right ventricle has normal systolic function. The cavity was normal. There is no increase in right ventricular wall thickness. 3. No evidence of mitral valve stenosis. No mitral regurgitation. 4. The aortic valve is tricuspid Mild calcification of the aortic valve. no stenosis of the aortic valve. 5. The aortic root and ascending aorta are normal in size and structure. 6. The IVC was normal in size. No complete TR doppler jet so unable to estimate PA systolic pressure.   Cardiac MRI (05/08/18):  IMPRESSION: 1. Normal left ventricular size, thickness and systolic function (LVEF = 38%). There are akinesis of the mid anterior, anteroseptal, inferoseptal and apical anterior and septal walls. There is endocardial late gadolinium enhancement in the mid anterior, anteroseptal, inferoseptal and apical anterior and septal walls with 75-100% trans-murality and signs of reperfusion. There is also severe edema in those segments. A large multilobular thrombus measuring 26 x 19 x 16 mm is present in the left ventricular apex.  2. Normal right ventricular size, thickness and systolic function (LVEF = 50%). There are no regional wall motion abnormalities.  3. Mildly dilated left atrium. Normal right atrial size.  4. Normal  size of the aortic root, ascending aorta. Mildly dilated pulmonary artery measuring 34 mm.  5. Mild mitral and tricuspid regurgitation.  6. Normal pericardium. No pericardial effusion.  A repeat MRI or echocardiogram is recommended as significant late gadolinium enhancement might represent severe edema. An anticoagulation is recommended for the apical thrombus.  Patient Profile  58 y.o. male with prior history of hypertension hyperlipidemia who developed chest pain late last week. He was admitted by Dr. Radford Pax where his EKG showed acute anterior septal myocardial infarction. He was taken urgently to the Cath Lab by Dr. Lendon Collar an occluded LAD in the midportion which appeared to be subacute with minimal collaterals and an EF of 20 to 25% with severe anteroapical hypokinesia.   Patient Profile     58 y.o. male with prior history of hypertension hyperlipidemia who developed chest pain late last week. He was admitted by Dr. Radford Pax where his EKG showed acute anterior septal myocardial infarction. He was taken urgently to the Cath Lab by Dr. Lendon Collar an occluded LAD in the midportion which appeared to be subacute with minimal collaterals and an EF of 20 to 25% with severe anteroapical hypokinesia.  Assessment & Plan    1.Anterior STEMI:Cardiac catheterization performed by Dr. Kathi Der an occluded mid LAD without significant collaterals and severe anteroapical hypokinesia.Unclear when symptoms began. Given presentation, placed on DAPT with ASA/plavix, BB and spiro. No recurrent chest pain since admission. Working with cardiac rehab.   2.Ischemic cardiomyopathy:EF in the 25% range with what appears to be LV dilatation and anteroapical severe hypokinesia. No signs of volume overload on exam. -- tolerating carvedilol and spiro. BP low normal. Consider switching spiro to Entresto in outpatient setting if BP permits.  3: Essential hypertension-low normal.  Asymptomatic.  4: Hyperlipidemia-on high dose statin now. LDL 87, Trig 198. Recheck in 6 weeks.  5. NSVT: 22- beat run on telemetry on 3/13 (asymptomatic). On carvedilol. K normal. Mg 1.8. I will given 400 mg Mag Oxide once today with a goal of keeping serum level greater than 2. I think he is a suitable candidate for LifeVest prior to discharge.  I filled out paperwork yesterday.  The company contacted our PA and declined it as they said the signature was illegible and cardiac MRI LVEF was greater than 35%.  I feel the echocardiogram is a very accurate assessment of his severely reduced LVEF and given previous run of nonsustained ventricular tachycardia, I think it is prudent. Will try to resubmit.  6. Apical Thrombus:Echo with contrastdid not show LV thrombus butfollow up cardiac MRI confirms.  He is on IV heparin and coumadin per PharmD. INR goal 2.INR today is 1.9.  Hopefully he will be therapeutic tomorrow and he can be discharged.   For questions or updates, please contact [place] Please consult [email or web address] for contact info under Cardiology/STEMI.      Signed, Kate Sable, MD  05/13/2018, 11:22 AM

## 2018-05-13 NOTE — Progress Notes (Signed)
ANTICOAGULATION CONSULT NOTE - Follow Up Consult  Pharmacy Consult for heparin and new start coumadin Indication: r/o LV thrombus  Labs: Recent Labs    05/11/18 0417 05/12/18 0306 05/13/18 0406  HGB 14.4 14.6 14.3  HCT 39.0 40.3 40.5  PLT 375 419* 439*  LABPROT 15.0 17.2* 21.1*  INR 1.2 1.4* 1.9*  HEPARINUNFRC 0.34 0.53 0.63  CREATININE 1.22 1.07  --     Assessment: 58yo male remains on heparin for management of possible LV thrombus. LV thrombus confirmed on cardiac MRI. Pharmacy consulted to start warfarin.  -heparin level- 0.63, INR= 1.9   Goal of Therapy:  Heparin level 0.3-0.7 units/ml INR goal 2-3   Plan:  -Continue heparin 1950 units/hr -Warfarin 5mg  PO x1 tonight -Daily INR, heparin level, CBC  Hildred Laser, PharmD Clinical Pharmacist **Pharmacist phone directory can now be found on [email or web address] (PW [email or web address]).  Listed under [place].

## 2018-05-13 NOTE — Progress Notes (Signed)
Pt ambulated hallway more than 5 laps twice without any problem this shift.  Idolina Primer, RN

## 2018-05-14 LAB — CBC
HCT: 44.4 % (ref 39.0–52.0)
Hemoglobin: 15.6 g/dL (ref 13.0–17.0)
MCH: 27.6 pg (ref 26.0–34.0)
MCHC: 35.1 g/dL (ref 30.0–36.0)
MCV: 78.6 fL — ABNORMAL LOW (ref 80.0–100.0)
Platelets: 471 10*3/uL — ABNORMAL HIGH (ref 150–400)
RBC: 5.65 MIL/uL (ref 4.22–5.81)
RDW: 13.2 % (ref 11.5–15.5)
WBC: 8.5 10*3/uL (ref 4.0–10.5)
nRBC: 0 % (ref 0.0–0.2)

## 2018-05-14 LAB — PROTIME-INR
INR: 1.9 — ABNORMAL HIGH (ref 0.8–1.2)
INR: 2 — ABNORMAL HIGH (ref 0.8–1.2)
Prothrombin Time: 21.5 seconds — ABNORMAL HIGH (ref 11.4–15.2)
Prothrombin Time: 22 seconds — ABNORMAL HIGH (ref 11.4–15.2)

## 2018-05-14 LAB — HEPARIN LEVEL (UNFRACTIONATED)
Heparin Unfractionated: 0.88 [IU]/mL — ABNORMAL HIGH (ref 0.30–0.70)
Heparin Unfractionated: 0.82 [IU]/mL — ABNORMAL HIGH (ref 0.30–0.70)

## 2018-05-14 MED ORDER — CLOPIDOGREL BISULFATE 75 MG PO TABS: 75.0000 mg | ORAL_TABLET | Freq: Every day | ORAL | 11 refills | Status: DC

## 2018-05-14 MED ORDER — ATORVASTATIN CALCIUM 80 MG PO TABS: 80.0000 mg | ORAL_TABLET | Freq: Every day | ORAL | 6 refills | Status: DC

## 2018-05-14 MED ORDER — WARFARIN SODIUM 10 MG PO TABS
10.0000 mg | ORAL_TABLET | Freq: Once | ORAL | Status: AC
  Administered 2018-05-14: 10 mg via ORAL
  Filled 2018-05-14: qty 1

## 2018-05-14 MED ORDER — ASPIRIN 81 MG PO TBEC: 81.0000 mg | DELAYED_RELEASE_TABLET | Freq: Every day | ORAL | 0 refills | Status: DC

## 2018-05-14 MED ORDER — COUMADIN BOOK
Freq: Once | Status: AC
  Administered 2018-05-14: 12:00:00

## 2018-05-14 MED ORDER — SPIRONOLACTONE 25 MG PO TABS: 12.5000 mg | ORAL_TABLET | Freq: Every day | ORAL | 6 refills | Status: DC

## 2018-05-14 MED ORDER — NITROGLYCERIN 0.4 MG SL SUBL: 0.4000 mg | SUBLINGUAL_TABLET | SUBLINGUAL | 4 refills | Status: DC | PRN

## 2018-05-14 MED ORDER — WARFARIN SODIUM 5 MG PO TABS: 7.5000 mg | ORAL_TABLET | Freq: Every day | ORAL | 0 refills | Status: DC

## 2018-05-14 MED ORDER — CARVEDILOL 6.25 MG PO TABS: 6.2500 mg | ORAL_TABLET | Freq: Two times a day (BID) | ORAL | 6 refills | Status: DC

## 2018-05-14 MED FILL — CARVEDILOL 6.25 MG TABLET: 6.25 | 30 days supply | Qty: 60 | Fill #0 | Status: TO

## 2018-05-14 MED FILL — ATORVASTATIN CALCIUM 80 MG: 80 | 30 days supply | Qty: 30 | Fill #0 | Status: TO

## 2018-05-14 MED FILL — WARFARIN SODIUM 5 MG TABLET: 5 | 20 days supply | Qty: 30 | Fill #0 | Status: TO

## 2018-05-14 MED FILL — NITROGLYCERIN 0.4 MG TAB SL: 0.4 | 25 days supply | Qty: 25 | Fill #0 | Status: TO

## 2018-05-14 MED FILL — SPIRONOLACTONE 25 MG TABLET: 25 | 30 days supply | Qty: 15 | Fill #0 | Status: TO

## 2018-05-14 MED FILL — CLOPIDOGREL 75 MG TABLET: 75 | 30 days supply | Qty: 30 | Fill #0 | Status: TO

## 2018-05-14 MED FILL — ASPIRIN LOW DOSE 81 MG TBEC: 81 | 30 days supply | Qty: 30 | Fill #0

## 2018-05-14 NOTE — Progress Notes (Addendum)
Progress Note  Patient Name: Caleb Thomas Date of Encounter: 05/14/2018  Primary Cardiologist: Dr. Radford Pax  Subjective   Feeling well. No chest pain, sob or palpitations.   Inpatient Medications    Scheduled Meds: . aspirin EC  81 mg Oral Daily  . atorvastatin  80 mg Oral q1800  . carvedilol  6.25 mg Oral BID WC  . clopidogrel  75 mg Oral Q breakfast  . sodium chloride flush  3 mL Intravenous Q12H  . spironolactone  12.5 mg Oral Daily  . warfarin  10 mg Oral ONCE-1800  . Warfarin - Pharmacist Dosing Inpatient   Does not apply q1800   Continuous Infusions: . sodium chloride 10 mL/hr at 05/12/18 1800  . sodium chloride    . heparin 1,750 Units/hr (05/14/18 0553)   PRN Meds: sodium chloride, acetaminophen, ondansetron (ZOFRAN) IV, sodium chloride flush   Vital Signs    Vitals:   05/13/18 1733 05/13/18 2037 05/14/18 0623 05/14/18 0758  BP: 100/74 111/74 98/68 101/71  Pulse: 77 75 65   Resp:  18 18   Temp:  97.9 F (36.6 C) 97.9 F (36.6 C)   TempSrc:  Oral Oral   SpO2:  97% 100%   Weight:   90.3 kg   Height:       No intake or output data in the 24 hours ending 05/14/18 0836 Last 3 Weights 05/14/2018 05/13/2018 05/12/2018  Weight (lbs) 199 lb 1.6 oz 200 lb 6.4 oz 201 lb 3.2 oz  Weight (kg) 90.311 kg 90.901 kg 91.264 kg      Telemetry    SR at 90s - Personally Reviewed  ECG    N/A  Physical Exam   GEN: No acute distress.   Neck: No JVD Cardiac: RRR, no murmurs, rubs, or gallops.  Respiratory: Clear to auscultation bilaterally. GI: Soft, nontender, non-distended  MS: No edema; No deformity. Neuro:  Nonfocal  Psych: Normal affect   Labs    Chemistry Recent Labs  Lab 05/09/18 0401 05/11/18 0417 05/12/18 0306  NA 137 137 136  K 4.1 4.3 4.0  CL 108 108 107  CO2 22 20* 20*  GLUCOSE 98 104* 104*  BUN 19 14 14   CREATININE 1.14 1.22 1.07  CALCIUM 8.2* 8.4* 8.5*  GFRNONAA >60 >60 >60  GFRAA >60 >60 >60  ANIONGAP 7 9 9      Hematology Recent  Labs  Lab 05/12/18 0306 05/13/18 0406 05/14/18 0401  WBC 9.2 8.6 8.5  RBC 5.11 5.15 5.65  HGB 14.6 14.3 15.6  HCT 40.3 40.5 44.4  MCV 78.9* 78.6* 78.6*  MCH 28.6 27.8 27.6  MCHC 36.2* 35.3 35.1  RDW 13.3 13.3 13.2  PLT 419* 439* 471*    Cardiac Enzymes Recent Labs  Lab 05/07/18 1058  TROPONINI 28.00*    Radiology    No results found.  Cardiac Studies   Cath: 05/06/2018  Conclusions: 1. Severe single-vessel CAD with occluded mid LAD and faint left-to-left collaterals. 2. Severely reduced LVEF with anterior and apical akinesis; question LV apical thrombus. 3. Mildly elevated left ventricular filling pressure (LVEDP 15-20 mmHg).  Recommendations: 1. Given onset of symptoms 3-4 days ago and lack of ongoing chest pain, I recommend medical therapy, including 12 months of dual antiplatelet therapy with aspirin and clopidogrel. 2. Obtain echo with Definity to assess for LV apical thrombus. I will restart heparin infusion 2 hours after TR band deflation pending echo results. 3. Aggressive secondary prevention. 4. Given mildly elevated LVEDP and acute kidney  injury, maintain net even fluid balance. May need to consider gentle diuresis if dyspnea develops.  [name], MD  Echo: 05/07/2018  IMPRESSIONS   1. The left ventricle has a visually estimated ejection fraction of of 30%. The cavity size was normal. There is mildly increased left ventricular wall thickness. Left ventricular diastolic Doppler parameters are consistent with impaired relaxation.  Akinesis of the mid to apical anteroseptal, anterior, and inferoseptal walls, the apical inferior wall, and the true apex. There appeared to be an LV apical thrombus on non-contrast enhanced images. On images with Definity, surprisingly this was not  confirmed. Would treat for LV thrombus but would also confirm with cardiac MRI. 2. The right ventricle has normal systolic function. The cavity was normal. There is no  increase in right ventricular wall thickness. 3. No evidence of mitral valve stenosis. No mitral regurgitation. 4. The aortic valve is tricuspid Mild calcification of the aortic valve. no stenosis of the aortic valve. 5. The aortic root and ascending aorta are normal in size and structure. 6. The IVC was normal in size. No complete TR doppler jet so unable to estimate PA systolic pressure.   Cardiac MRI (05/08/18):  IMPRESSION: 1. Normal left ventricular size, thickness and systolic function (LVEF = 38%). There are akinesis of the mid anterior, anteroseptal, inferoseptal and apical anterior and septal walls. There is endocardial late gadolinium enhancement in the mid anterior, anteroseptal, inferoseptal and apical anterior and septal walls with 75-100% trans-murality and signs of reperfusion. There is also severe edema in those segments. A large multilobular thrombus measuring 26 x 19 x 16 mm is present in the left ventricular apex.  2. Normal right ventricular size, thickness and systolic function (LVEF = 50%). There are no regional wall motion abnormalities.  3. Mildly dilated left atrium. Normal right atrial size.  4. Normal size of the aortic root, ascending aorta. Mildly dilated pulmonary artery measuring 34 mm.  5. Mild mitral and tricuspid regurgitation.  6. Normal pericardium. No pericardial effusion.  A repeat MRI or echocardiogram is recommended as significant late gadolinium enhancement might represent severe edema. An anticoagulation is recommended for the apical thrombus.  Patient Profile   58 y.o.malewithprior history of hypertension hyperlipidemia who developed chest pain late last week. He was admitted by Dr. Radford Pax where his EKG showed acute anterior septal myocardial infarction. He was taken urgently to the Cath Lab by Dr. Lendon Collar an occluded LAD in the midportion which appeared to be subacute with minimal collaterals and an EF of 20 to  25% with severe anteroapical hypokinesia. Cardiac MRI with apical thrombus.     Assessment & Plan    1.Anterior STEMI:Cardiac catheterizationrevealed an occluded mid LAD without significant collaterals and severe anteroapical hypokinesia.Unclear when symptoms began. Given presentation, placed on DAPT with ASA/plavix, BB and spiro. No recurrent chest pain since admission. Working with cardiac rehab.  - Due to Apical thrombus, currently on triple therapy.   2.Ischemic cardiomyopathy:EF in the 25% range with what appears to be LV dilatation and anteroapical severe hypokinesia. No signs of volume overload on exam. -- toleratingcarvediloland spiro.BP low normal. Consider switching spiro to Entresto in outpatient setting if BP permits.  3: Essential hypertension-low normal. Asymptomatic.  4: Hyperlipidemia-on high dose statin now. LDL 87, Trig 198. Recheck in 6 weeks.  5. NSVT:22- beatrun on telemetry on 3/13 (asymptomatic). No recurrence.Electrolyes stable. Continue BB. Waiting Lifevest.   6. Apical Thrombus:Echo with contrastdid not show LV thrombus butfollow up cardiac MRI confirms.  He is on  IV heparin and coumadin per PharmD. INR goal 2.INR 1.9 this morning. Plan to repeat at noon. May be discharge today pending INR level and LifeVest fitting.   For questions or updates, please contact [place] Please consult [email or web address] for contact info under     SignedLeanor Kail, PA  05/14/2018, 8:36 AM    Patient seen, examined. Available data reviewed. Agree with findings, assessment, and plan as outlined by Robbie Lis, PA-C. On my exam, the patient looks good, sitting up in chair with no symptoms.   Vitals:   05/14/18 0623 05/14/18 0758  BP: 98/68 101/71  Pulse: 65   Resp: 18   Temp: 97.9 F (36.6 C)   SpO2: 100%    Pt is alert and oriented, NAD HEENT: normal Neck: JVP - normal, carotids 2+= without bruits Lungs: CTA bilaterally CV: RRR  without murmur or gallop Abd: soft, NT, Positive BS, no hepatomegaly Ext: no C/C/E, distal pulses intact and equal Skin: warm/dry no rash  2 remaining issues: therapeutic INR with patient on IV heparin/warfarin overlap and awaiting LifeVest. Once he is therapeutic and fitted for LifeVest he can be discharged with close OP follow-up. 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 Unable to add ACE/ARB/Entresto with current BP. He is tolerating beta blocker and aldactone.   Sherren Mocha, M.D. 05/14/2018 10:52 AM

## 2018-05-14 NOTE — Progress Notes (Signed)
[place] for heparin/Coumadin Indication: r/o LV thrombus  Labs: Recent Labs    05/12/18 0306 05/13/18 0406 05/14/18 0401  HGB 14.6 14.3 15.6  HCT 40.3 40.5 44.4  PLT 419* 439* 471*  LABPROT 17.2* 21.1* 21.5*  INR 1.4* 1.9* 1.9*  HEPARINUNFRC 0.53 0.63 0.88*  CREATININE 1.07  --   --     Assessment: 58 yo male with LV thrombus for heparin  Goal of Therapy:  Heparin level 0.3-0.7 units/ml INR goal 2-3   Plan:  Decrease Heparin 1750 units/hr Check heparin level in 8 hours.  Coumadin 10 mg today  Phillis Knack, PharmD, BCPS

## 2018-05-14 NOTE — Progress Notes (Signed)
CARDIAC REHAB PHASE I   Pt seen ambulating in hallways independently. Pt denies CP or SOB. Pt ordered LifeVest, showed pt how to watch video. Pt states he is continuing to feel better and is able to increase his distance. Pt referred to [facility] [place] but is aware that it may be a while before attending. Pt hopeful to discharge today.  [id number] Rufina Falco, RN BSN 05/14/2018 10:45 AM

## 2018-05-14 NOTE — Discharge Instructions (Signed)
No driving for 2 weeks. No lifting over 10 lbs for 4 weeks. No sexual activity for 4 weeks. You may not return to work until cleared by your cardiologist (for at least 6-8 weeks). Keep procedure site clean & dry. If you notice increased pain, swelling, bleeding or pus, call/return!  You may shower, but no soaking baths/hot tubs/pools for 1 week.    Information on my medicine - Coumadin   (Warfarin)  This medication education was reviewed with me or my healthcare representative as part of my discharge preparation.  The pharmacist that spoke with me during my hospital stay was:  Ignatius Specking Nimer, RPH  Why was Coumadin prescribed for you? Coumadin was prescribed for you because you have a blood clot or a medical condition that can cause an increased risk of forming blood clots. Blood clots can cause serious health problems by blocking the flow of blood to the heart, lung, or brain. Coumadin can prevent harmful blood clots from forming. As a reminder your indication for Coumadin is:   Select from menu  What test will check on my response to Coumadin? While on Coumadin (warfarin) you will need to have an INR test regularly to ensure that your dose is keeping you in the desired range. The INR (international normalized ratio) number is calculated from the result of the laboratory test called prothrombin time (PT).  If an INR APPOINTMENT HAS NOT ALREADY BEEN MADE FOR YOU please schedule an appointment to have this lab work done by your health care provider within 7 days. Your INR goal is usually a number between:  2 to 3 or your provider may give you a more narrow range like 2-2.5.  Ask your health care provider during an office visit what your goal INR is.  What  do you need to  know  About  COUMADIN? Take Coumadin (warfarin) exactly as prescribed by your healthcare provider about the same time each day.  DO NOT stop taking without talking to the doctor who prescribed the medication.  Stopping without other  blood clot prevention medication to take the place of Coumadin may increase your risk of developing a new clot or stroke.  Get refills before you run out.  What do you do if you miss a dose? If you miss a dose, take it as soon as you remember on the same day then continue your regularly scheduled regimen the next day.  Do not take two doses of Coumadin at the same time.  Important Safety Information A possible side effect of Coumadin (Warfarin) is an increased risk of bleeding. You should call your healthcare provider right away if you experience any of the following: ? Bleeding from an injury or your nose that does not stop. ? Unusual colored urine (red or dark brown) or unusual colored stools (red or black). ? Unusual bruising for unknown reasons. ? A serious fall or if you hit your head (even if there is no bleeding).  Some foods or medicines interact with Coumadin (warfarin) and might alter your response to warfarin. To help avoid this: ? Eat a balanced diet, maintaining a consistent amount of Vitamin K. ? Notify your provider about major diet changes you plan to make. ? Avoid alcohol or limit your intake to 1 drink for women and 2 drinks for men per day. (1 drink is 5 oz. wine, 12 oz. beer, or 1.5 oz. liquor.)  Make sure that ANY health care provider who prescribes medication for you knows that  you are taking Coumadin (warfarin).  Also make sure the healthcare provider who is monitoring your Coumadin knows when you have started a new medication including herbals and non-prescription products.  Coumadin (Warfarin)  Major Drug Interactions  Increased Warfarin Effect Decreased Warfarin Effect  Alcohol (large quantities) Antibiotics (esp. Septra/Bactrim, Flagyl, Cipro) Amiodarone (Cordarone) Aspirin (ASA) Cimetidine (Tagamet) Megestrol (Megace) NSAIDs (ibuprofen, naproxen, etc.) Piroxicam (Feldene) Propafenone (Rythmol SR) Propranolol (Inderal) Isoniazid (INH) Posaconazole  (Noxafil) Barbiturates (Phenobarbital) Carbamazepine (Tegretol) Chlordiazepoxide (Librium) Cholestyramine (Questran) Griseofulvin Oral Contraceptives Rifampin Sucralfate (Carafate) Vitamin K   Coumadin (Warfarin) Major Herbal Interactions  Increased Warfarin Effect Decreased Warfarin Effect  Garlic Ginseng Ginkgo biloba Coenzyme Q10 Green tea St. Johns wort    Coumadin (Warfarin) FOOD Interactions  Eat a consistent number of servings per week of foods HIGH in Vitamin K (1 serving =  cup)  Collards (cooked, or boiled & drained) Kale (cooked, or boiled & drained) Mustard greens (cooked, or boiled & drained) Parsley *serving size only =  cup Spinach (cooked, or boiled & drained) Swiss chard (cooked, or boiled & drained) Turnip greens (cooked, or boiled & drained)  Eat a consistent number of servings per week of foods MEDIUM-HIGH in Vitamin K (1 serving = 1 cup)  Asparagus (cooked, or boiled & drained) Broccoli (cooked, boiled & drained, or raw & chopped) Brussel sprouts (cooked, or boiled & drained) *serving size only =  cup Lettuce, raw (green leaf, endive, romaine) Spinach, raw Turnip greens, raw & chopped   These websites have more information on Coumadin (warfarin):  FailFactory.se; VeganReport.com.au;

## 2018-05-14 NOTE — Progress Notes (Signed)
Teaching completed with patient and his wife with teachback and sufficient understanding. Bilateral IVs removed and pt transported in W/C to his car.

## 2018-05-14 NOTE — Discharge Summary (Signed)
Discharge Summary    Patient ID: Caleb Thomas MRN: [id number]; DOB: 1960/10/07  Admit date: 05/06/2018 Discharge date: 05/14/2018  Primary Care Provider: Carlena Hurl, PA-C  Primary Cardiologist: Fransico Him, MD Discharge Diagnoses    Active Problems:   Acute ST elevation myocardial infarction (STEMI) involving left anterior descending coronary artery [facility])   STEMI involving left anterior descending coronary artery (HCC)   ICM   HTN   HLD   NSVT   Apical thrombus  Allergies No Known Allergies  Diagnostic Studies/Procedures    Cath: 05/06/2018  Conclusions: 1. Severe single-vessel CAD with occluded mid LAD and faint left-to-left collaterals. 2. Severely reduced LVEF with anterior and apical akinesis; question LV apical thrombus. 3. Mildly elevated left ventricular filling pressure (LVEDP 15-20 mmHg).  Recommendations: 1. Given onset of symptoms 3-4 days ago and lack of ongoing chest pain, I recommend medical therapy, including 12 months of dual antiplatelet therapy with aspirin and clopidogrel. 2. Obtain echo with Definity to assess for LV apical thrombus. I will restart heparin infusion 2 hours after TR band deflation pending echo results. 3. Aggressive secondary prevention. 4. Given mildly elevated LVEDP and acute kidney injury, maintain net even fluid balance. May need to consider gentle diuresis if dyspnea develops.  [name], MD  Echo: 05/07/2018  IMPRESSIONS   1. The left ventricle has a visually estimated ejection fraction of of 30%. The cavity size was normal. There is mildly increased left ventricular wall thickness. Left ventricular diastolic Doppler parameters are consistent with impaired relaxation.  Akinesis of the mid to apical anteroseptal, anterior, and inferoseptal walls, the apical inferior wall, and the true apex. There appeared to be an LV apical thrombus on non-contrast enhanced images. On images with Definity, surprisingly this was  not  confirmed. Would treat for LV thrombus but would also confirm with cardiac MRI. 2. The right ventricle has normal systolic function. The cavity was normal. There is no increase in right ventricular wall thickness. 3. No evidence of mitral valve stenosis. No mitral regurgitation. 4. The aortic valve is tricuspid Mild calcification of the aortic valve. no stenosis of the aortic valve. 5. The aortic root and ascending aorta are normal in size and structure. 6. The IVC was normal in size. No complete TR doppler jet so unable to estimate PA systolic pressure.   Cardiac MRI (05/08/18):  IMPRESSION: 1. Normal left ventricular size, thickness and systolic function (LVEF = 38%). There are akinesis of the mid anterior, anteroseptal, inferoseptal and apical anterior and septal walls. There is endocardial late gadolinium enhancement in the mid anterior, anteroseptal, inferoseptal and apical anterior and septal walls with 75-100% trans-murality and signs of reperfusion. There is also severe edema in those segments. A large multilobular thrombus measuring 26 x 19 x 16 mm is present in the left ventricular apex.  2. Normal right ventricular size, thickness and systolic function (LVEF = 50%). There are no regional wall motion abnormalities.  3. Mildly dilated left atrium. Normal right atrial size.  4. Normal size of the aortic root, ascending aorta. Mildly dilated pulmonary artery measuring 34 mm.  5. Mild mitral and tricuspid regurgitation.  6. Normal pericardium. No pericardial effusion.  A repeat MRI or echocardiogram is recommended as significant late gadolinium enhancement might represent severe edema. An anticoagulation is recommended for the apical thrombus.  History of Present Illness     Caleb Thomas is a 58 y.o. male with a history of hyperlipidemia and hypertension as well as remote history of tobacco use  who presented to the emergency room with complaints of  chest pain 05/06/18.  Patient was in his usual state of health until last Thursday when he developed chest pain while lifting a heavy door.  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  He says he was doing some heavy lifting and all of a sudden developed chest pain on the left side of his chest.  There was no radiation of the pain into his arms neck or back.  There was no numbness in his arms or tingling.  He denied any associated symptoms of shortness of breath, nausea or diaphoresis.  He said the pain has been off and on since then although it has never gone completely away.  He said certain movements will make the pain worse.  When asked what made him come to the emergency room tonight he stated that he had been out of town today and when he got home he continued to have the pain and became concerned and presented to the emergency room.    In ER, complains of 3 out of 10 pain.  Initial EKG showed ST elevation throughout the anterior precordial leads along with Q waves.  Initial labs show a creatinine of 1.84 which is new compared to creatinine on 03/09/2018 at 1.18.  Troponin elevated at 16.81.  LDL 82. White blood cell count 14.1. Taken to cath lab.   Hospital Course     Consultants: None  1.Anterior STEMI:Cardiac catheterizationrevealed an occluded mid LAD without significant collaterals and severe anteroapical hypokinesia.Unclear when symptoms began. Given presentation, placed on DAPT with ASA/plavix, BB and spiro. No recurrent chest pain since admission. Walking with cardiac rehab without any issue.  - Due to Apical thrombus, currently on triple therapy. Stop ASA in 30 days.   2.Ischemic cardiomyopathy:EF in the 25% range with what appears to be LV dilatation and anteroapical severe hypokinesia. No signs of volume overload on exam. -- Toleratingcarvediloland spiro.BP low normal. Consider switching spiro to Entresto in outpatient setting if BP  permits.  3: Essential hypertension-low normal. Asymptomatic.  4: Hyperlipidemia-on high dose statin now. LDL 87, Trig 198. Recheck in 6 weeks.  5. NSVT:22- beatrun on telemetry on 3/13 (asymptomatic). No recurrence. Electrolyes stable. Continue BB. Waiting Lifevest. Will be fitting prior to discharge today.   6. Apical Thrombus:Echo with contrastdid not show LV thrombus butfollow up cardiac MRI confirms.Treated with IV heparin and coumadin per PharmD.  INR at goal of 2 today. LifeVest fitting later this evening prior to discharge.   The patient been seen by Dr. Burt Knack today and deemed ready for discharge home. All follow-up appointments have been scheduled. Discharge medications are listed below.    Discharge Vitals Blood pressure 120/72, pulse 93, temperature 98 F (36.7 C), temperature source Oral, resp. rate 18, height 5\' 8"  (1.727 m), weight 90.3 kg, SpO2 100 %.  Filed Weights   05/12/18 0652 05/13/18 0520 05/14/18 0623  Weight: 91.3 kg 90.9 kg 90.3 kg    Labs & Radiologic Studies    CBC Recent Labs    05/13/18 0406 05/14/18 0401  WBC 8.6 8.5  HGB 14.3 15.6  HCT 40.5 44.4  MCV 78.6* 78.6*  PLT 439* 619*   Basic Metabolic Panel Recent Labs    05/12/18 0306  NA 136  K 4.0  CL 107  CO2 20*  GLUCOSE 104*  BUN 14  CREATININE 1.07  CALCIUM 8.5*  MG 1.8  _____________  Dg Chest [facility]  Result Date: 05/06/2018 CLINICAL DATA:  Shortness of breath starting yesterday. Left-sided chest pain. EXAM: PORTABLE CHEST 1 VIEW COMPARISON:  10/11/2011 FINDINGS: Borderline heart size with normal pulmonary vascularity. Probable small left pleural effusion with hazy infiltration behind the heart, possibly pneumonia. Right lung appears clear and expanded. Degenerative changes in the spine and shoulders. Calcification of the aorta. No pneumothorax. IMPRESSION: Probable small left pleural effusion with infiltration in the left lung base behind the heart, possibly  pneumonia. Electronically Signed   By: Lucienne Capers M.D.   On: 05/06/2018 22:10   Mr Cardiac Morphology W Wo Contrast  Result Date: 05/09/2018 CLINICAL DATA:  58 year old male post STEMI on 05/06/2018 with suspicion for an apical thrombus on the echocardiogram. EXAM: CARDIAC MRI TECHNIQUE: The patient was scanned on a 1.5 Tesla GE magnet. A dedicated cardiac coil was used. Functional imaging was done using Fiesta sequences. 2,3, and 4 chamber views were done to assess for RWMA's. Modified Simpson's rule using a short axis stack was used to calculate an ejection fraction on a dedicated work Conservation officer, nature. The patient received 10 cc of Gadavist. After 10 minutes inversion recovery sequences were used to assess for infiltration and scar tissue. CONTRAST:  10 cc  of Gadavist FINDINGS: 1. Normal left ventricular size, thickness and systolic function (LVEF = 38%). There are akinesis of the mid anterior, anteroseptal, inferoseptal and apical anterior and septal walls. There is endocardial late gadolinium enhancement in the mid anterior, anteroseptal, inferoseptal and apical anterior and septal walls with 75-100% transmurality and signs of reperfusion. There is also severe edema in those segments. A large multilobular thrombus measuring 26 x 19 x 16 mm is present in the left ventricular apex. LVEDD: 57 mm LVESD: 41 mm LVEDV: 158 ml LVESV: 99 ml SV: 60 ml CO: 5.6 L/min Myocardial mass: 189 g 2. Normal right ventricular size, thickness and systolic function (LVEF = 50%). There are no regional wall motion abnormalities. 3. Mildly dilated left atrium. Normal right atrial size. 4. Normal size of the aortic root, ascending aorta. Mildly dilated pulmonary artery measuring 34 mm. 5. Mild mitral and tricuspid regurgitation. 6. Normal pericardium.  No pericardial effusion. IMPRESSION: 1. Normal left ventricular size, thickness and systolic function (LVEF = 38%). There are akinesis of the mid anterior,  anteroseptal, inferoseptal and apical anterior and septal walls. There is endocardial late gadolinium enhancement in the mid anterior, anteroseptal, inferoseptal and apical anterior and septal walls with 75-100% trans-murality and signs of reperfusion. There is also severe edema in those segments. A large multilobular thrombus measuring 26 x 19 x 16 mm is present in the left ventricular apex. 2. Normal right ventricular size, thickness and systolic function (LVEF = 50%). There are no regional wall motion abnormalities. 3. Mildly dilated left atrium. Normal right atrial size. 4. Normal size of the aortic root, ascending aorta. Mildly dilated pulmonary artery measuring 34 mm. 5. Mild mitral and tricuspid regurgitation. 6. Normal pericardium.  No pericardial effusion. A repeat MRI or echocardiogram is recommended as significant late gadolinium enhancement might represent severe edema. An anticoagulation is recommended for the apical thrombus. Electronically Signed   By: Ena Dawley   On: 05/09/2018 14:09   Disposition   Pt is being discharged home today in good condition.  Follow-up Plans & Appointments    Follow-up Information    [facility] [facility]. Go on 05/16/2018.   Specialty:  Cardiology Why:  for INR check at coumadin clinic @ 10:30pm  Contact information:  [address] [place], [place]. Go on 05/22/2018.   Specialty:  Cardiology Why:  @2pm  for hospital follow up  Contact information: [place] [address] [phone]          Discharge Instructions    Amb Referral to Cardiac Rehabilitation   Complete by:  As directed    Diagnosis:  STEMI      Discharge Medications   Allergies as of 05/14/2018   No Known Allergies     Medication List    STOP taking these medications   hydrochlorothiazide 12.5 MG tablet Commonly known as:  HYDRODIURIL   ibuprofen  200 MG tablet Commonly known as:  ADVIL,MOTRIN   losartan 50 MG tablet Commonly known as:  COZAAR   pravastatin 20 MG tablet Commonly known as:  PRAVACHOL     TAKE these medications   aspirin 81 MG EC tablet Take 1 tablet (81 mg total) by mouth daily. Start taking on:  May 15, 2018   atorvastatin 80 MG tablet Commonly known as:  LIPITOR Take 1 tablet (80 mg total) by mouth daily at 6 PM.   carvedilol 6.25 MG tablet Commonly known as:  COREG Take 1 tablet (6.25 mg total) by mouth 2 (two) times daily with a meal.   CHOLESTEROL RELIEF PO Take 1 capsule by mouth daily.   clopidogrel 75 MG tablet Commonly known as:  PLAVIX Take 1 tablet (75 mg total) by mouth daily with breakfast. Start taking on:  May 15, 2018   multivitamin with minerals Tabs tablet Take 1 tablet by mouth daily.   sildenafil 100 MG tablet Commonly known as:  VIAGRA TAKE 1 TABLET BY MOUTH EVERY DAY AS NEEDED   spironolactone 25 MG tablet Commonly known as:  ALDACTONE Take 0.5 tablets (12.5 mg total) by mouth daily. Start taking on:  May 15, 2018   warfarin 5 MG tablet Commonly known as:  COUMADIN Take 1.5 tablets (7.5 mg total) by mouth daily.        Acute coronary syndrome (MI, NSTEMI, STEMI, etc) this admission?: Yes.     AHA/ACC Clinical Performance & Quality Measures: 1. Aspirin prescribed? - Yes 2. ADP Receptor Inhibitor (Plavix/Clopidogrel, Brilinta/Ticagrelor or Effient/Prasugrel) prescribed (includes medically managed patients)? - Yes 3. Beta Blocker prescribed? - Yes 4. High Intensity Statin (Lipitor 40-80mg  or Crestor 20-40mg ) prescribed? - Yes 5. EF assessed during THIS hospitalization? - Yes 6. For EF <40%, was ACEI/ARB prescribed? - No - Reason:  Plan to add Entresto as outpatient if BP allows 7. For EF <40%, Aldosterone Antagonist (Spironolactone or Eplerenone) prescribed? - Yes 8. Cardiac Rehab Phase II ordered (Included Medically managed Patients)? - Yes     Outstanding  Labs/Studies   Consider OP f/u labs 6-8 weeks given statin initiation this admission.  Duration of Discharge Encounter   Greater than 30 minutes including physician time.  Jarrett Soho, PA 05/14/2018, 3:00 PM

## 2018-05-15 ENCOUNTER — Telehealth (HOSPITAL_COMMUNITY): Payer: Self-pay

## 2018-05-15 NOTE — Telephone Encounter (Signed)
Attempted to call patient in regards to Cardiac Rehab - LM on VM 

## 2018-05-15 NOTE — Telephone Encounter (Signed)
Pt returned CR,  patient expressed interest. Explained scheduling process and went over insurance, patient verbalized understanding. Will contact patient for scheduling once f/u has been completed.

## 2018-05-15 NOTE — Telephone Encounter (Signed)
Pt insurance is active and benefits verified through [employer]. Co-pay $35.00, DED $2,750.00/$0.00 met, out of pocket $6,550.00/$48.43 met, co-insurance 0%. No pre-authorization required. Passport, 05/15/2018 @ 2:14PM, REF# [id number] ° °Will contact patient to see if he is interested in the Cardiac Rehab Program. If interested, patient will need to complete follow up appt. Once completed, patient will be contacted for scheduling upon review by the RN Navigator. °

## 2018-05-16 ENCOUNTER — Other Ambulatory Visit: Payer: Self-pay

## 2018-05-16 ENCOUNTER — Ambulatory Visit (INDEPENDENT_AMBULATORY_CARE_PROVIDER_SITE_OTHER): Payer: 59 | Admitting: *Deleted

## 2018-05-16 ENCOUNTER — Telehealth: Payer: Self-pay | Admitting: Physician Assistant

## 2018-05-16 ENCOUNTER — Other Ambulatory Visit: Payer: Self-pay | Admitting: Medical

## 2018-05-16 DIAGNOSIS — I2102 ST elevation (STEMI) myocardial infarction involving left anterior descending coronary artery: Secondary | ICD-10-CM

## 2018-05-16 DIAGNOSIS — Z5181 Encounter for therapeutic drug level monitoring: Secondary | ICD-10-CM | POA: Diagnosis not present

## 2018-05-16 LAB — POCT INR: INR: 3.3 — AB (ref 2.0–3.0)

## 2018-05-16 NOTE — Patient Instructions (Addendum)
Description   Hold tomorrow's dose then start taking 1.5 tablets daily except 1 tablet on Sundays, Tuesdays, and Thursdays. Recheck INR in 1 week with MD appt. [facility] [phone] Main [phone]     A full discussion of the nature of anticoagulants has been carried out.  A benefit risk analysis has been presented to the patient, so that they understand the justification for choosing anticoagulation at this time. The need for frequent and regular monitoring, precise dosage adjustment and compliance is stressed.  Side effects of potential bleeding are discussed.  The patient should avoid any OTC items containing aspirin or ibuprofen, and should avoid great swings in general diet.  Avoid alcohol consumption.  Call if any signs of abnormal bleeding.

## 2018-05-16 NOTE — Telephone Encounter (Signed)
New message:   Opal Sidles from [employer], needs to ask some questions please.

## 2018-05-16 NOTE — Telephone Encounter (Signed)
Spoke with patient over phone. Reviewed my discharge summary.  Due to [place] outbreak will reschedule appointment. Patient is agree with plan. Will call us soon if any concern.

## 2018-05-17 NOTE — Telephone Encounter (Signed)
[employer] referred to third-party vendor, [employer], who processes disability paperwork.

## 2018-05-21 ENCOUNTER — Telehealth: Payer: Self-pay

## 2018-05-21 NOTE — Telephone Encounter (Signed)
LMOM FOR PRESCREEN AND DRIVE THRU AND LET HIM KNOW THAT APPT TIME WILL NEED TO BE RESCHEDULED

## 2018-05-22 ENCOUNTER — Ambulatory Visit: Payer: 59 | Admitting: Physician Assistant

## 2018-05-22 ENCOUNTER — Other Ambulatory Visit: Payer: Self-pay

## 2018-05-22 ENCOUNTER — Ambulatory Visit (INDEPENDENT_AMBULATORY_CARE_PROVIDER_SITE_OTHER): Payer: 59 | Admitting: Pharmacist

## 2018-05-22 DIAGNOSIS — Z5181 Encounter for therapeutic drug level monitoring: Secondary | ICD-10-CM

## 2018-05-22 DIAGNOSIS — I2102 ST elevation (STEMI) myocardial infarction involving left anterior descending coronary artery: Secondary | ICD-10-CM | POA: Diagnosis not present

## 2018-05-22 LAB — POCT INR: INR: 4 — AB (ref 2.0–3.0)

## 2018-05-28 ENCOUNTER — Telehealth: Payer: Self-pay | Admitting: Physician Assistant

## 2018-05-28 NOTE — Telephone Encounter (Signed)
Spoke with pt and completed prescreening for COVID19.  1. Do you currently have a fever? No (yes = cancel and refer to pcp for e-visit) 2. Have you recently travelled on a cruise, internationally, or to [place], [place], [place], [place], [place], or [place], [place] [employer]) ? No (yes = cancel, stay home, monitor symptoms, and contact pcp or initiate e-visit if symptoms develop) 3. Have you been in contact with someone that is currently pending confirmation of Covid19 testing or has been confirmed to have the [place] virus?  No (yes = cancel, stay home, away from tested individual, monitor symptoms, and contact pcp or initiate e-visit if symptoms develop) 4. Are you currently experiencing fatigue or cough? NO (yes = pt should be prepared to have a mask placed at the time of their visit).  Pt. Advised that we are restricting visitors at this time and anyone present in the vehicle should meet the above criteria as well. Advised that visit will be at curbside for finger stick ONLY and will receive call with instructions. Pt also advised to please bring own pen for signature of arrival document.

## 2018-05-28 NOTE — Telephone Encounter (Signed)
Patient returning call to the coumadin clinic

## 2018-05-29 ENCOUNTER — Other Ambulatory Visit: Payer: Self-pay

## 2018-05-29 ENCOUNTER — Ambulatory Visit (INDEPENDENT_AMBULATORY_CARE_PROVIDER_SITE_OTHER): Payer: 59 | Admitting: Pharmacist

## 2018-05-29 DIAGNOSIS — Z5181 Encounter for therapeutic drug level monitoring: Secondary | ICD-10-CM | POA: Diagnosis not present

## 2018-05-29 DIAGNOSIS — I2102 ST elevation (STEMI) myocardial infarction involving left anterior descending coronary artery: Secondary | ICD-10-CM | POA: Diagnosis not present

## 2018-05-29 LAB — POCT INR: INR: 3.2 — AB (ref 2.0–3.0)

## 2018-06-01 ENCOUNTER — Telehealth: Payer: Self-pay | Admitting: Physician Assistant

## 2018-06-01 NOTE — Telephone Encounter (Signed)
   TELEPHONE CALL NOTE  This patient has been deemed a candidate for follow-up tele-health visit to limit community exposure during the Covid-19 pandemic. I spoke with the patient via phone to discuss instructions. This has been outlined on the patient's AVS (dotphrase: [email or web address]). The patient was advised to review the section on consent for treatment as well. The patient will receive a phone call 2-3 days prior to their E-Visit at which time consent will be verbally confirmed. A Virtual Office Visit appointment type has been scheduled for hospital follow up with Robbie Lis, PAC, with "VIDEO" or "TELEPHONE" in the appointment notes - patient prefers Video type.  I have either confirmed the patient is active in MyChart or offered to send sign-up link to phone/email via Mychart icon beside patient's photo.  [place], [place] 06/01/2018 12:29 PM

## 2018-06-04 ENCOUNTER — Telehealth: Payer: Self-pay

## 2018-06-04 NOTE — Telephone Encounter (Signed)

## 2018-06-05 ENCOUNTER — Telehealth: Payer: Self-pay | Admitting: Physician Assistant

## 2018-06-05 ENCOUNTER — Other Ambulatory Visit: Payer: Self-pay

## 2018-06-05 ENCOUNTER — Ambulatory Visit (INDEPENDENT_AMBULATORY_CARE_PROVIDER_SITE_OTHER): Payer: 59 | Admitting: *Deleted

## 2018-06-05 DIAGNOSIS — I2102 ST elevation (STEMI) myocardial infarction involving left anterior descending coronary artery: Secondary | ICD-10-CM | POA: Diagnosis not present

## 2018-06-05 DIAGNOSIS — Z5181 Encounter for therapeutic drug level monitoring: Secondary | ICD-10-CM

## 2018-06-05 LAB — POCT INR: INR: 3.1 — AB (ref 2.0–3.0)

## 2018-06-05 NOTE — Telephone Encounter (Signed)
Virtual Visit Pre-Appointment Phone Call  Steps For Call:  1. Confirm consent - "In the setting of the current Covid19 crisis, you are scheduled for a (phone or video) visit with your provider on (date) at (time).  Just as we do with many in-office visits, in order for you to participate in this visit, we must obtain consent.  If you'd like, I can send this to your mychart (if signed up) or email for you to review.  Otherwise, I can obtain your verbal consent now.  All virtual visits are billed to your insurance company just like a normal visit would be.  By agreeing to a virtual visit, we'd like you to understand that the technology does not allow for your provider to perform an examination, and thus may limit your provider's ability to fully assess your condition.  Finally, though the technology is pretty good, we cannot assure that it will always work on either your or our end, and in the setting of a video visit, we may have to convert it to a phone-only visit.  In either situation, we cannot ensure that we have a secure connection.  Are you willing to proceed?"  2. Give patient instructions for WebEx download to smartphone as below if video visit  3. Advise patient to be prepared with any vital sign or heart rhythm information, their current medicines, and a piece of paper and pen handy for any instructions they may receive the day of their visit  4. Inform patient they will receive a phone call 15 minutes prior to their appointment time (may be from unknown caller ID) so they should be prepared to answer  5. Confirm that appointment type is correct in Epic appointment notes (video vs telephone)    TELEPHONE CALL NOTE  Caleb Thomas has been deemed a candidate for a follow-up tele-health visit to limit community exposure during the Covid-19 pandemic. I spoke with the patient via phone to ensure availability of phone/video source, confirm preferred email & phone number, and discuss  instructions and expectations.  I reminded Caleb Thomas to be prepared with any vital sign and/or heart rhythm information that could potentially be obtained via home monitoring, at the time of his visit. I reminded Caleb Thomas to expect a phone call at the time of his visit if his visit.  Did the patient verbally acknowledge consent to treatment? 06/11/2018  Jeanann Lewandowsky, [place] 06/05/2018 4:33 PM   DOWNLOADING THE [place], go to [employer] and type in WebEx in the search bar. [place] [employer], the blue/green circle. The app is free but as with any other app downloads, their phone may require them to verify saved payment information or Apple password. The patient does NOT have to create an account.  - If Android, ask patient to go to [employer] and type in WebEx in the search bar. [place] [employer], the blue/green circle. The app is free but as with any other app downloads, their phone may require them to verify saved payment information or Android password. The patient does NOT have to create an account.   CONSENT FOR TELE-HEALTH VISIT - PLEASE REVIEW  I hereby voluntarily request, consent and authorize [facility] and its employed or contracted physicians, physician assistants, nurse practitioners or other licensed health care professionals (the Practitioner), to provide me with telemedicine health care services (the "Services") as deemed necessary by the treating Practitioner. I acknowledge and consent  to receive the Services by the Practitioner via telemedicine. I understand that the telemedicine visit will involve communicating with the Practitioner through live audiovisual communication technology and the disclosure of certain medical information by electronic transmission. I acknowledge that I have been given the opportunity to request an in-person assessment or other available alternative prior to the telemedicine visit and  am voluntarily participating in the telemedicine visit.  I understand that I have the right to withhold or withdraw my consent to the use of telemedicine in the course of my care at any time, without affecting my right to future care or treatment, and that the Practitioner or I may terminate the telemedicine visit at any time. I understand that I have the right to inspect all information obtained and/or recorded in the course of the telemedicine visit and may receive copies of available information for a reasonable fee.  I understand that some of the potential risks of receiving the Services via telemedicine include:  Marland Kitchen Delay or interruption in medical evaluation due to technological equipment failure or disruption; . Information transmitted may not be sufficient (e.g. poor resolution of images) to allow for appropriate medical decision making by the Practitioner; and/or  . In rare instances, security protocols could fail, causing a breach of personal health information.  Furthermore, I acknowledge that it is my responsibility to provide information about my medical history, conditions and care that is complete and accurate to the best of my ability. I acknowledge that Practitioner's advice, recommendations, and/or decision may be based on factors not within their control, such as incomplete or inaccurate data provided by me or distortions of diagnostic images or specimens that may result from electronic transmissions. I understand that the practice of medicine is not an exact science and that Practitioner makes no warranties or guarantees regarding treatment outcomes. I acknowledge that I will receive a copy of this consent concurrently upon execution via email to the email address I last provided but may also request a printed copy by calling the office of [place].    I understand that my insurance will be billed for this visit.   I have read or had this consent read to me. . I understand the  contents of this consent, which adequately explains the benefits and risks of the Services being provided via telemedicine.  . I have been provided ample opportunity to ask questions regarding this consent and the Services and have had my questions answered to my satisfaction. . I give my informed consent for the services to be provided through the use of telemedicine in my medical care  By participating in this telemedicine visit I agree to the above.

## 2018-06-05 NOTE — Telephone Encounter (Addendum)
° ° °  Patient calling for status of disability forms    Are you calling in reference to your FMLA or disability form? YES 1. What is your question in regards to FMLA or disability form? STATUS   2. Do you need copies of your medical records? NO  3. Are you waiting on a nurse to call you back with results or are you wanting copies of your results? NO    Please route to Medical Records or your medical records site representative

## 2018-06-05 NOTE — Patient Instructions (Addendum)
Description   Spoke with pt and instructed pt to take 1 tablet daily today then start taking 1 tablet daily except 1.5 tablets on Tuesdays.  Recheck INR in 1 week. Coumadin Clinic [phone] Main [phone]

## 2018-06-05 NOTE — Telephone Encounter (Signed)
Message routed to [facility] for follow up.

## 2018-06-05 NOTE — Telephone Encounter (Signed)
Spoke with patient via [place].  Verified all demographics and e-mail. Patient actively uses My Chart.

## 2018-06-08 ENCOUNTER — Other Ambulatory Visit: Payer: Self-pay

## 2018-06-08 MED ORDER — ASPIRIN 81 MG PO TBEC: 81.0000 mg | DELAYED_RELEASE_TABLET | Freq: Every day | ORAL | 3 refills | Status: DC

## 2018-06-11 ENCOUNTER — Ambulatory Visit: Payer: 59 | Admitting: Physician Assistant

## 2018-06-11 ENCOUNTER — Telehealth (INDEPENDENT_AMBULATORY_CARE_PROVIDER_SITE_OTHER): Payer: 59 | Admitting: Physician Assistant

## 2018-06-11 ENCOUNTER — Encounter: Payer: Self-pay | Admitting: Physician Assistant

## 2018-06-11 ENCOUNTER — Other Ambulatory Visit: Payer: Self-pay

## 2018-06-11 ENCOUNTER — Telehealth: Payer: Self-pay

## 2018-06-11 VITALS — BP 119/86 | HR 92 | Ht 68.0 in | Wt 198.0 lb

## 2018-06-11 DIAGNOSIS — I10 Essential (primary) hypertension: Secondary | ICD-10-CM

## 2018-06-11 DIAGNOSIS — I513 Intracardiac thrombosis, not elsewhere classified: Secondary | ICD-10-CM

## 2018-06-11 DIAGNOSIS — I255 Ischemic cardiomyopathy: Secondary | ICD-10-CM

## 2018-06-11 DIAGNOSIS — Z7189 Other specified counseling: Secondary | ICD-10-CM

## 2018-06-11 DIAGNOSIS — I25110 Atherosclerotic heart disease of native coronary artery with unstable angina pectoris: Secondary | ICD-10-CM | POA: Diagnosis not present

## 2018-06-11 DIAGNOSIS — E785 Hyperlipidemia, unspecified: Secondary | ICD-10-CM

## 2018-06-11 NOTE — Telephone Encounter (Signed)

## 2018-06-11 NOTE — Progress Notes (Signed)
Virtual Visit via Video Note   This visit type was conducted due to national recommendations for restrictions regarding the COVID-19 Pandemic (e.g. social distancing) in an effort to limit this patient's exposure and mitigate transmission in our community.  Due to his co-morbid illnesses, this patient is at least at moderate risk for complications without adequate follow up.  This format is felt to be most appropriate for this patient at this time.  All issues noted in this document were discussed and addressed.  A limited physical exam was performed with this format.  Please refer to the patient's chart for his consent to telehealth for [facility].   Evaluation Performed:  Follow-up visit  Date:  06/12/2018   ID:  Caleb Thomas, DOB [birth year], MRN [id number]  Patient Location: Home  Provider Location: Home  PCP:  Carlena Hurl, PA-C  Cardiologist:  Fransico Him, MD  Chief Complaint:  Hospital follow up  History of Present Illness:    Caleb Thomas is a 58 y.o. male who presents via audio/video conferencing for a telehealth visit today.    The patient does not have symptoms concerning for COVID-19 infection (fever, chills, cough, or new shortness of breath).   Caleb Tuckeris a 58 y.o.malewith a history of hyperlipidemia and hypertension as well as remote history of tobacco who admitted 04/2018 with Anterior STEMI. Cath showed severe single-vessel CAD with occluded mid LAD and faint left-to-left collaterals. Given onset of symptoms 3-4 days ago and lack of ongoing chest pain,  recommended medical therapy, including 12 months of dual antiplatelet therapy with aspirin and clopidogrel. EF in the 25% range with what appears to be LV dilatation and anteroapical severe hypokinesia. No signs of volume overload. Started on BB and spironolactone. May add enteresto as outpatient. Echo with contrastdid not show LV thrombus butfollow up cardiac MRI confirms. Started on coumadin and LifeVest  was placed.   Today patient denies any recurrent chest pain.  He is walking 2000 steps at a time without any issue.  He does have noted intermittent shortness of breath lasting for few seconds to minutes.  Not complain of any lower extremity edema, orthopnea or PND.  He also has intermittent 1 to 2 pound weight gain however did not pay attention to weight gain.  Compliant with low-sodium diet.  Blood pressure usually runs in 353-614 systolically.  Past Medical History:  Diagnosis Date  . Hyperlipidemia   . Hypertension 2015   Past Surgical History:  Procedure Laterality Date  . COLONOSCOPY  2013   "normal" per patient, [place]  . LEFT HEART CATH AND CORONARY ANGIOGRAPHY N/A 05/06/2018   Procedure: LEFT HEART CATH AND CORONARY ANGIOGRAPHY;  Surgeon: [name], MD;  Location: [place] [facility];  Service: Cardiovascular;  Laterality: N/A;     Current Meds  Medication Sig  . aspirin 81 MG EC tablet Take 1 tablet (81 mg total) by mouth daily.  Marland Kitchen atorvastatin (LIPITOR) 80 MG tablet Take 1 tablet (80 mg total) by mouth daily at 6 PM.  . carvedilol (COREG) 6.25 MG tablet Take 1 tablet (6.25 mg total) by mouth 2 (two) times daily with a meal.  . clopidogrel (PLAVIX) 75 MG tablet Take 1 tablet (75 mg total) by mouth daily with breakfast.  . nitroGLYCERIN (NITROSTAT) 0.4 MG SL tablet Place 1 tablet (0.4 mg total) under the tongue every 5 (five) minutes as needed for chest pain.  Marland Kitchen warfarin (COUMADIN) 5 MG tablet Take 1.5 tablets (7.5 mg total) by mouth daily.  . [  DISCONTINUED] spironolactone (ALDACTONE) 25 MG tablet Take 0.5 tablets (12.5 mg total) by mouth daily.     Allergies:   Patient has no known allergies.   Social History   Tobacco Use  . Smoking status: Former Research scientist (life sciences)  . Smokeless tobacco: Never Used  . Tobacco comment: quit smoking 58 years old ago  Substance Use Topics  . Alcohol use: Yes    Alcohol/week: 3.0 standard drinks    Types: 3 Cans of beer per week    Comment:  on weekend   . Drug use: Not on file     Family Hx: The patient's family history includes Heart disease in his father; Hypertension in his brother and brother; Stroke in his brother and mother. There is no history of Cancer.  ROS:   Please see the history of present illness.     All other systems reviewed and are negative.   Prior CV studies:   The following studies were reviewed today:  Cath: 05/06/2018  Conclusions: 1. Severe single-vessel CAD with occluded mid LAD and faint left-to-left collaterals. 2. Severely reduced LVEF with anterior and apical akinesis; question LV apical thrombus. 3. Mildly elevated left ventricular filling pressure (LVEDP 15-20 mmHg).  Recommendations: 1. Given onset of symptoms 3-4 days ago and lack of ongoing chest pain, I recommend medical therapy, including 12 months of dual antiplatelet therapy with aspirin and clopidogrel. 2. Obtain echo with Definity to assess for LV apical thrombus. I will restart heparin infusion 2 hours after TR band deflation pending echo results. 3. Aggressive secondary prevention. 4. Given mildly elevated LVEDP and acute kidney injury, maintain net even fluid balance. May need to consider gentle diuresis if dyspnea develops.  [name], MD  Echo: 05/07/2018  IMPRESSIONS   1. The left ventricle has a visually estimated ejection fraction of of 30%. The cavity size was normal. There is mildly increased left ventricular wall thickness. Left ventricular diastolic Doppler parameters are consistent with impaired relaxation.  Akinesis of the mid to apical anteroseptal, anterior, and inferoseptal walls, the apical inferior wall, and the true apex. There appeared to be an LV apical thrombus on non-contrast enhanced images. On images with Definity, surprisingly this was not  confirmed. Would treat for LV thrombus but would also confirm with cardiac MRI. 2. The right ventricle has normal systolic function. The cavity was  normal. There is no increase in right ventricular wall thickness. 3. No evidence of mitral valve stenosis. No mitral regurgitation. 4. The aortic valve is tricuspid Mild calcification of the aortic valve. no stenosis of the aortic valve. 5. The aortic root and ascending aorta are normal in size and structure. 6. The IVC was normal in size. No complete TR doppler jet so unable to estimate PA systolic pressure.   Cardiac MRI (05/08/18):  IMPRESSION: 1. Normal left ventricular size, thickness and systolic function (LVEF = 38%). There are akinesis of the mid anterior, anteroseptal, inferoseptal and apical anterior and septal walls. There is endocardial late gadolinium enhancement in the mid anterior, anteroseptal, inferoseptal and apical anterior and septal walls with 75-100% trans-murality and signs of reperfusion. There is also severe edema in those segments. A large multilobular thrombus measuring 26 x 19 x 16 mm is present in the left ventricular apex.  2. Normal right ventricular size, thickness and systolic function (LVEF = 50%). There are no regional wall motion abnormalities.  3. Mildly dilated left atrium. Normal right atrial size.  4. Normal size of the aortic root, ascending aorta. Mildly dilated  pulmonary artery measuring 34 mm.  5. Mild mitral and tricuspid regurgitation.  6. Normal pericardium. No pericardial effusion.  A repeat MRI or echocardiogram is recommended as significant late gadolinium enhancement might represent severe edema. An anticoagulation is recommended for the apical thrombus.  Labs/Other Tests and Data Reviewed:    EKG:    Recent Labs: 05/07/2018: ALT 53; TSH 1.583 05/12/2018: BUN 14; Creatinine, Ser 1.07; Magnesium 1.8; Potassium 4.0; Sodium 136 05/14/2018: Hemoglobin 15.6; Platelets 471   Recent Lipid Panel Lab Results  Component Value Date/Time   CHOL 142 05/07/2018 06:54 AM   CHOL 157 03/09/2018 12:20 PM   TRIG 198 (H)  05/07/2018 06:54 AM   HDL 15 (L) 05/07/2018 06:54 AM   HDL 35 (L) 03/09/2018 12:20 PM   CHOLHDL 9.5 05/07/2018 06:54 AM   LDLCALC 87 05/07/2018 06:54 AM   LDLCALC 82 03/09/2018 12:20 PM   LDLCALC 85 03/06/2017 11:38 AM    Wt Readings from Last 3 Encounters:  06/11/18 198 lb (89.8 kg)  05/14/18 199 lb 1.6 oz (90.3 kg)  03/09/18 210 lb (95.3 kg)     Objective:    Vital Signs:  BP 119/86   Pulse 92   Ht 5\' 8"  (1.727 m)   Wt 198 lb (89.8 kg)   BMI 30.11 kg/m    Well nourished, well developed male in no acute distress. Good spirited.  Normal affect.  Answers questions normally.  ASSESSMENT & PLAN:    1. CAD 2nd to anterior STEMI - Cath with occluded mLAD, no PCI done.  No chest pain.  Continue aspirin, Plavix, statin and beta-blocker.  2. ICM - EF in the 25% range with what appears to be LV dilatation and anteroapical severe hypokinesia. LifeVest placed. -Patient with intermittent dyspnea and weight gain.  However, did not pay attention to correlation.  He denies orthopnea, PND or lower extremity edema.  He is not on Brilinta that can cause him to have intermittent shortness of breath.  Less suspicious for pulmonary embolism as patient on Coumadin. -Continue Coreg at current dose.  Increase Spironolactone to 25 mg daily.  Advised to keep log of his blood pressure, symptoms and weight.  Consider Spironolactone/ARB at follow-up.  3. Apical thrombus - On coumadin  4. HTN -Systolic blood pressure of 105-109 at home. BP of 119/86 today.  Increase Spironolactone as above.  5. HLD -05/07/2018: Cholesterol 142; HDL 15; LDL Cholesterol 87; Triglycerides 198; VLDL 40  - Continue statin.  Consider recheck labs in 6  weeks.  COVID-19 Education: The signs and symptoms of COVID-19 were discussed with the patient and how to seek care for testing (follow up with PCP or arrange E-visit).  The importance of social distancing was discussed today.  Time:   Today, I have spent 20 minutes with  the patient with telehealth technology discussing the above problems.     Medication Adjustments/Labs and Tests Ordered: Current medicines are reviewed at length with the patient today.  Concerns regarding medicines are outlined above.  Tests Ordered: No orders of the defined types were placed in this encounter.   Medication Changes: Meds ordered this encounter  Medications  . spironolactone (ALDACTONE) 25 MG tablet    Sig: Take 1 tablet (25 mg total) by mouth daily.    Dispense:  90 tablet    Refill:  3    Disposition:  Follow up in 3 week(s)  Signed, Leanor Kail, PA  06/12/2018 9:05 AM    [facility]

## 2018-06-12 ENCOUNTER — Other Ambulatory Visit: Payer: Self-pay

## 2018-06-12 ENCOUNTER — Telehealth (HOSPITAL_COMMUNITY): Payer: Self-pay | Admitting: *Deleted

## 2018-06-12 ENCOUNTER — Ambulatory Visit (INDEPENDENT_AMBULATORY_CARE_PROVIDER_SITE_OTHER): Payer: 59

## 2018-06-12 DIAGNOSIS — I2102 ST elevation (STEMI) myocardial infarction involving left anterior descending coronary artery: Secondary | ICD-10-CM | POA: Diagnosis not present

## 2018-06-12 DIAGNOSIS — Z5181 Encounter for therapeutic drug level monitoring: Secondary | ICD-10-CM

## 2018-06-12 LAB — POCT INR: INR: 2.7 (ref 2.0–3.0)

## 2018-06-12 MED ORDER — SPIRONOLACTONE 25 MG PO TABS: 25.0000 mg | ORAL_TABLET | Freq: Every day | ORAL | 3 refills | Status: DC

## 2018-06-12 NOTE — Patient Instructions (Signed)
Description   Called spoke with pt and instructed pt to continue on same dosage 1 tablet daily except 1.5 tablets on Tuesdays.  Recheck INR in 2 weeks. Coumadin Clinic [phone] Main [phone]

## 2018-06-12 NOTE — Patient Instructions (Signed)
Medication Instructions:  Your physician has recommended you make the following change in your medication:  1.  INCREASE the Spironolactone to 25 mg daily.  I have sent in a new prescription for the new dose to [facility],  If you need a refill on your cardiac medications before your next appointment, please call your pharmacy.   Lab work: None ordered  If you have labs (blood work) drawn today and your tests are completely normal, you will receive your results only by: Marland Kitchen MyChart Message (if you have MyChart) OR . A paper copy in the mail If you have any lab test that is abnormal or we need to change your treatment, we will call you to review the results.  Testing/Procedures: None ordered  Follow-Up: At [facility], you and your health needs are our priority.  As part of our continuing mission to provide you with exceptional heart care, we have created designated Provider Care Teams.  These Care Teams include your primary Cardiologist (physician) and Advanced Practice Providers (APPs -  Physician Assistants and Nurse Practitioners) who all work together to provide you with the care you need, when you need it. You will need a follow up appointment in 3 weeks with Caleb Him, MD or one of the following Advanced Practice Providers on your designated Care Team:   [place], PA-C Melina Copa, PA-C . Ermalinda Barrios, PA-C  Any Other Special Instructions Will Be Listed Below (If Applicable).

## 2018-06-12 NOTE — Telephone Encounter (Signed)
Called and spoke to pt in regards to referral and our departmental closure due to the national recommendations for Covid-19. Pt is very interested in participating and is walking every day with no complaints.  Encouraged pt to increase his time each day with goal of 2 30 minute walks a day which can be broken up into miniwalks throughout the day.  Reviewed with pt his insurance benefits.  Pt interested in attending 2 x week for a shorter period of time due to the copay - 35.00 each session. .calr

## 2018-06-14 DIAGNOSIS — I420 Dilated cardiomyopathy: Secondary | ICD-10-CM | POA: Diagnosis not present

## 2018-06-14 DIAGNOSIS — I213 ST elevation (STEMI) myocardial infarction of unspecified site: Secondary | ICD-10-CM | POA: Diagnosis not present

## 2018-06-25 ENCOUNTER — Telehealth: Payer: Self-pay

## 2018-06-25 NOTE — Telephone Encounter (Signed)
lmom for prescreen  

## 2018-06-26 ENCOUNTER — Other Ambulatory Visit: Payer: Self-pay

## 2018-06-26 ENCOUNTER — Ambulatory Visit (INDEPENDENT_AMBULATORY_CARE_PROVIDER_SITE_OTHER): Payer: 59 | Admitting: *Deleted

## 2018-06-26 DIAGNOSIS — Z5181 Encounter for therapeutic drug level monitoring: Secondary | ICD-10-CM | POA: Diagnosis not present

## 2018-06-26 DIAGNOSIS — I2102 ST elevation (STEMI) myocardial infarction involving left anterior descending coronary artery: Secondary | ICD-10-CM

## 2018-06-26 LAB — POCT INR: INR: 2.4 (ref 2.0–3.0)

## 2018-06-26 NOTE — Patient Instructions (Signed)
Description   Called spoke with pt and instructed pt to continue on same dosage 1 tablet daily except 1.5 tablets on Tuesdays.  Recheck INR in 3 weeks. Coumadin Clinic [phone] Main [phone]

## 2018-07-10 ENCOUNTER — Other Ambulatory Visit: Payer: Self-pay | Admitting: Pharmacist

## 2018-07-10 ENCOUNTER — Telehealth: Payer: Self-pay | Admitting: Cardiology

## 2018-07-10 DIAGNOSIS — I2102 ST elevation (STEMI) myocardial infarction involving left anterior descending coronary artery: Secondary | ICD-10-CM

## 2018-07-10 DIAGNOSIS — I472 Ventricular tachycardia: Secondary | ICD-10-CM

## 2018-07-10 DIAGNOSIS — I4729 Other ventricular tachycardia: Secondary | ICD-10-CM

## 2018-07-10 MED ORDER — WARFARIN SODIUM 5 MG PO TABS: ORAL_TABLET | ORAL | 0 refills | Status: DC

## 2018-07-10 NOTE — Telephone Encounter (Signed)
New Message   Patient's wife states there were some discrepancy's on her FMLA papers she needs corrected please call her to discuss.

## 2018-07-11 ENCOUNTER — Telehealth (HOSPITAL_COMMUNITY): Payer: Self-pay | Admitting: Radiology

## 2018-07-11 NOTE — Telephone Encounter (Signed)
Left message to call office-needs to schedule echocardiogram.

## 2018-07-11 NOTE — Telephone Encounter (Signed)
Call patient to schedule echocardiogram-number rang once and then busy. Unable to leave a message.

## 2018-07-11 NOTE — Telephone Encounter (Signed)
Pt wife, Enid Derry, is asking that we change her FMLA papers in regards to having to drive the pt to his appts since he has not been told he can drive since his MI 07/3147.  She reports the initial forms state he will have 2 appts per year but she wants to factor in his INR checks 2x/month and potential for Cardiac Rehab to start eventually after COVID restrictions lessened.Marland Kitchen  She would like an addendum addressing the change to the original forms and stating the increased number of visits needed with her driving assistance so she can not work those days.   She has new forms she can bring to the office but I explained to her Dr. Radford Pax not back in the office to sign.   Last OV with Vin Bhagat PA on 06/11/18 requested 3 week OV with another APP or Dr. Radford Pax re: BP so she needs to make that appt and not sure if should be virtual again and will also need that day off.   Will forward to Dr. Radford Pax for her review.

## 2018-07-11 NOTE — Telephone Encounter (Signed)
I can change the FLMA papers until end of month but if Echo shows improvement in LVF and LifeVest stopped then he would be able to drive himself to coumadin clinic, OVs and cardiac rehab after that.

## 2018-07-11 NOTE — Telephone Encounter (Signed)
The appt should be with me and should be virtual.  He needs a repeat 2D echo in the next 2 weeks.  If LVF has improved then the Life Vest can be removed and he will be able to drive himself and she will not need any further FMLA

## 2018-07-11 NOTE — Telephone Encounter (Signed)
Per Dr. Radford Pax it is ok to schedule the appointment after the echo. Will route to [place] to schedule the appointment.

## 2018-07-11 NOTE — Telephone Encounter (Signed)
What are the discrepancies

## 2018-07-11 NOTE — Telephone Encounter (Signed)
Spoke to patient and his wife about Dr. Theodosia Blender recommendations. Caleb Thomas and his wife were in agreement with the recommendations. Message sent to echo scheduler to schedule the echo.   Will forward to Dr. Radford Pax to decide when the follow up appointment should be. Per Vin at office visit on 4/13 follow up was to be in 3 weeks. Will confirm with Dr. Radford Pax that follow up should be after echo.

## 2018-07-16 ENCOUNTER — Telehealth (HOSPITAL_COMMUNITY): Payer: Self-pay | Admitting: *Deleted

## 2018-07-16 ENCOUNTER — Telehealth: Payer: Self-pay

## 2018-07-16 ENCOUNTER — Telehealth (HOSPITAL_COMMUNITY): Payer: Self-pay

## 2018-07-16 NOTE — Telephone Encounter (Signed)
LMTCB COVID prescreening for echo. 

## 2018-07-16 NOTE — Telephone Encounter (Signed)

## 2018-07-16 NOTE — Telephone Encounter (Signed)
Follow-up call placed to patient in regards to Cardiac Rehab referral and continued department closure due the COVID-19 pandemic. Pt received packet from Cardiac Rehab in the mail with the warm-up/cool-down stretches. Patient is walking 10,000 steps/ day and doing hand weight exercises that he received from Cardiac Rehab. Pt feels the walking is really helping him. Pt notes that he is no longer having shortness of breath and now is able to reach the top of the stairs without problems. Will continue to follow.  Sol Passer, [place], ACSM CEP [phone]

## 2018-07-17 ENCOUNTER — Other Ambulatory Visit: Payer: Self-pay

## 2018-07-17 ENCOUNTER — Ambulatory Visit (INDEPENDENT_AMBULATORY_CARE_PROVIDER_SITE_OTHER): Payer: 59 | Admitting: Pharmacist

## 2018-07-17 ENCOUNTER — Telehealth (HOSPITAL_COMMUNITY): Payer: Self-pay | Admitting: *Deleted

## 2018-07-17 DIAGNOSIS — Z5181 Encounter for therapeutic drug level monitoring: Secondary | ICD-10-CM | POA: Diagnosis not present

## 2018-07-17 DIAGNOSIS — I2102 ST elevation (STEMI) myocardial infarction involving left anterior descending coronary artery: Secondary | ICD-10-CM | POA: Diagnosis not present

## 2018-07-17 LAB — POCT INR: INR: 3 (ref 2.0–3.0)

## 2018-07-17 NOTE — Telephone Encounter (Signed)

## 2018-07-18 ENCOUNTER — Ambulatory Visit (HOSPITAL_COMMUNITY): Payer: 59 | Attending: Cardiovascular Disease

## 2018-07-18 DIAGNOSIS — I2102 ST elevation (STEMI) myocardial infarction involving left anterior descending coronary artery: Secondary | ICD-10-CM | POA: Diagnosis not present

## 2018-07-18 MED ORDER — PERFLUTREN LIPID MICROSPHERE
1.0000 mL | INTRAVENOUS | Status: AC | PRN
  Administered 2018-07-18: 2 mL via INTRAVENOUS

## 2018-07-18 NOTE — Telephone Encounter (Signed)
[place], [address] Triage        Patient would like for RN to call him regarding extending his FMLA. He thinks that the [facility] coordinator wants him to go back to work tomorrow but we have not gotten the echo results back yet. He had his echo today. FMLA will not extend his days out until they hear from [place].

## 2018-07-18 NOTE — Telephone Encounter (Signed)
Patient needs to be referred to EP for evaluation for AICD for primary prevention

## 2018-07-19 NOTE — Progress Notes (Unsigned)
Erroneous encounter

## 2018-07-19 NOTE — Telephone Encounter (Signed)
FMLA forms updated by Dr Radford Pax and sent to patient.  He will need an appointment with EP.  I will route to EP for an appointment next week.  Wife is aware

## 2018-07-19 NOTE — Telephone Encounter (Signed)
Spoke with pt and discussed echo results and recommendations regarding EP referral. Pt understands EP scheduler will call him asap to set up appointment.  Pt had questions regarding driving. I advised him since he had an episode of NSVT, the state of [place] restricts his driving for 6 months unless improvement was shown on his echo which would place him out of the "at risk" category for ventricular arrhythmia. 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 I explained to him this will all be discussed during his EP evaluation appointment.   Pt and his wife are increasingly anxious with extending FMLA paperwork. Pts wife states she notified the office last week regarding a discrepancy on her husbands FMLA paperwork which needs to be addressed. She understands pt will be seen by EP, but his paperwork needs to be addend before seeing an EP physician.   I advised pt's wife and pt I would forward this message back to Dr Radford Pax as a request she contact them today, as they requested.   She had no additional questions and will await Dr Radford Pax and EP schedulers phone calls.

## 2018-07-19 NOTE — Telephone Encounter (Signed)
I have talked with Janan Halter and she will look into the FLMA paperwork to see if we can addend the old one.  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

## 2018-07-20 ENCOUNTER — Telehealth: Payer: Self-pay | Admitting: Internal Medicine

## 2018-07-20 NOTE — Telephone Encounter (Signed)
New message    Spoke w/pt wife about scheduling for a video visit with [name]. Pt was scheduled for 05.26.20. Pt smart phone number is listed in appt notes.      Virtual Visit Pre-Appointment Phone Call  "(Name), I am calling you today to discuss your upcoming appointment. We are currently trying to limit exposure to the virus that causes COVID-19 by seeing patients at home rather than in the office."  1. "What is the BEST phone number to call the day of the visit?" - include this in appointment notes  2. Do you have or have access to (through a family member/friend) a smartphone with video capability that we can use for your visit?" a. If yes - list this number in appt notes as cell (if different from BEST phone #) and list the appointment type as a VIDEO visit in appointment notes b. If no - list the appointment type as a PHONE visit in appointment notes  3. Confirm consent - "In the setting of the current Covid19 crisis, you are scheduled for a (phone or video) visit with your provider on (date) at (time).  Just as we do with many in-office visits, in order for you to participate in this visit, we must obtain consent.  If you'd like, I can send this to your mychart (if signed up) or email for you to review.  Otherwise, I can obtain your verbal consent now.  All virtual visits are billed to your insurance company just like a normal visit would be.  By agreeing to a virtual visit, we'd like you to understand that the technology does not allow for your provider to perform an examination, and thus may limit your provider's ability to fully assess your condition. If your provider identifies any concerns that need to be evaluated in person, we will make arrangements to do so.  Finally, though the technology is pretty good, we cannot assure that it will always work on either your or our end, and in the setting of a video visit, we may have to convert it to a phone-only visit.  In either  situation, we cannot ensure that we have a secure connection.  Are you willing to proceed?" STAFF: Did the patient verbally acknowledge consent to telehealth visit? Document YES/NO here: YES  4. Advise patient to be prepared - "Two hours prior to your appointment, go ahead and check your blood pressure, pulse, oxygen saturation, and your weight (if you have the equipment to check those) and write them all down. When your visit starts, your provider will ask you for this information. If you have an Apple Watch or Kardia device, please plan to have heart rate information ready on the day of your appointment. Please have a pen and paper handy nearby the day of the visit as well."  5. Give patient instructions for MyChart download to smartphone OR Doximity/Doxy.me as below if video visit (depending on what platform provider is using)  6. Inform patient they will receive a phone call 15 minutes prior to their appointment time (may be from unknown caller ID) so they should be prepared to answer    TELEPHONE CALL NOTE  Caleb Thomas has been deemed a candidate for a follow-up tele-health visit to limit community exposure during the Covid-19 pandemic. I spoke with the patient via phone to ensure availability of phone/video source, confirm preferred email & phone number, and discuss instructions and expectations.  I reminded Caleb Thomas to be prepared with any  vital sign and/or heart rhythm information that could potentially be obtained via home monitoring, at the time of his visit. I reminded Caleb Thomas to expect a phone call prior to his visit.  [place] Harriette Ohara 07/20/2018 8:25 AM   INSTRUCTIONS FOR DOWNLOADING THE MYCHART APP TO SMARTPHONE  - The patient must first make sure to have activated MyChart and know their login information - If Apple, go to [employer] and type in MyChart in the search bar and download the app. If Android, ask patient to go to [employer] and type in [place] in the  search bar and download the app. The app is free but as with any other app downloads, their phone may require them to verify saved payment information or Apple/Android password.  - The patient will need to then log into the app with their MyChart username and password, and select [facility] as their healthcare provider to link the account. When it is time for your visit, go to the MyChart app, find appointments, and click Begin Video Visit. Be sure to Select Allow for your device to access the Microphone and Camera for your visit. You will then be connected, and your provider will be with you shortly.  **If they have any issues connecting, or need assistance please contact MyChart service desk [phone])**  **If using a computer, in order to ensure the best quality for their visit they will need to use either of the following Internet Browsers: [employer], or Google Chrome**  IF USING DOXIMITY or DOXY.ME - The patient will receive a link just prior to their visit by text.     FULL LENGTH CONSENT FOR TELE-HEALTH VISIT   I hereby voluntarily request, consent and authorize [facility] and its employed or contracted physicians, physician assistants, nurse practitioners or other licensed health care professionals (the Practitioner), to provide me with telemedicine health care services (the Services") as deemed necessary by the treating Practitioner. I acknowledge and consent to receive the Services by the Practitioner via telemedicine. I understand that the telemedicine visit will involve communicating with the Practitioner through live audiovisual communication technology and the disclosure of certain medical information by electronic transmission. I acknowledge that I have been given the opportunity to request an in-person assessment or other available alternative prior to the telemedicine visit and am voluntarily participating in the telemedicine visit.  I understand that I  have the right to withhold or withdraw my consent to the use of telemedicine in the course of my care at any time, without affecting my right to future care or treatment, and that the Practitioner or I may terminate the telemedicine visit at any time. I understand that I have the right to inspect all information obtained and/or recorded in the course of the telemedicine visit and may receive copies of available information for a reasonable fee.  I understand that some of the potential risks of receiving the Services via telemedicine include:   Delay or interruption in medical evaluation due to technological equipment failure or disruption;  Information transmitted may not be sufficient (e.g. poor resolution of images) to allow for appropriate medical decision making by the Practitioner; and/or   In rare instances, security protocols could fail, causing a breach of personal health information.  Furthermore, I acknowledge that it is my responsibility to provide information about my medical history, conditions and care that is complete and accurate to the best of my ability. I acknowledge that Practitioner's advice, recommendations, and/or decision may  be based on factors not within their control, such as incomplete or inaccurate data provided by me or distortions of diagnostic images or specimens that may result from electronic transmissions. I understand that the practice of medicine is not an exact science and that Practitioner makes no warranties or guarantees regarding treatment outcomes. I acknowledge that I will receive a copy of this consent concurrently upon execution via email to the email address I last provided but may also request a printed copy by calling the office of [facility].    I understand that my insurance will be billed for this visit.   I have read or had this consent read to me.  I understand the contents of this consent, which adequately explains the benefits and risks of the  Services being provided via telemedicine.   I have been provided ample opportunity to ask questions regarding this consent and the Services and have had my questions answered to my satisfaction.  I give my informed consent for the services to be provided through the use of telemedicine in my medical care  By participating in this telemedicine visit I agree to the above.

## 2018-07-24 ENCOUNTER — Other Ambulatory Visit: Payer: Self-pay

## 2018-07-24 ENCOUNTER — Telehealth (INDEPENDENT_AMBULATORY_CARE_PROVIDER_SITE_OTHER): Payer: 59 | Admitting: Internal Medicine

## 2018-07-24 ENCOUNTER — Telehealth: Payer: Self-pay | Admitting: Cardiology

## 2018-07-24 DIAGNOSIS — I255 Ischemic cardiomyopathy: Secondary | ICD-10-CM | POA: Diagnosis not present

## 2018-07-24 MED ORDER — LISINOPRIL 5 MG PO TABS: ORAL_TABLET | ORAL | 3 refills | Status: DC

## 2018-07-24 NOTE — Telephone Encounter (Signed)
Operator should had opened a new note for pharmacy question. Current opened note does not pertain to the pharmacy calling with a question. This is now two different notes on the same call.

## 2018-07-24 NOTE — Telephone Encounter (Signed)
Patient is calling to see if we received his paperwork regarding disability for Dr Radford Pax to fill out.

## 2018-07-24 NOTE — Progress Notes (Signed)
Electrophysiology TeleHealth Note   Due to national recommendations of social distancing due to COVID 19, an audio/video telehealth visit is felt to be most appropriate for this patient at this time.  See MyChart message from today for the patient's consent to telehealth for [facility].   Date:  07/24/2018   ID:  Caleb Thomas, DOB 1961-01-31, MRN [id number]  Location: patient's home  Provider location: [address], [place]  Evaluation Performed: Follow-up visit  PCP:  Carlena Hurl, PA-C  Cardiologist:  Fransico Him, MD  Electrophysiologist:  Dr Lovena Le  Chief Complaint:  "I am ok."  History of Present Illness:    Caleb Thomas is a 58 y.o. male who presents via audio/video conferencing for a telehealth visit today. He is a pleasant 58 yo man who sustained an anterior STEMI about 3 months ago. He has undergone repeat 2D echo and his EF is 25-30%. His CHF symptoms have gone from class 3 to class 2. His BP has been a little soft and he has not been placed on an ACE inhibitor. He has not had syncope. No edema. No anginal symptoms. The patient works as a Production designer, theatre/television/film. The patient denies symptoms of fevers, chills, cough, or new SOB worrisome for COVID 19.  Past Medical History:  Diagnosis Date  . Hyperlipidemia   . Hypertension 2015    Past Surgical History:  Procedure Laterality Date  . COLONOSCOPY  2013   "normal" per patient, [place]  . LEFT HEART CATH AND CORONARY ANGIOGRAPHY N/A 05/06/2018   Procedure: LEFT HEART CATH AND CORONARY ANGIOGRAPHY;  Surgeon: [name], MD;  Location: [facility];  Service: Cardiovascular;  Laterality: N/A;    Current Outpatient Medications  Medication Sig Dispense Refill  . aspirin 81 MG EC tablet Take 1 tablet (81 mg total) by mouth daily. 90 tablet 3  . atorvastatin (LIPITOR) 80 MG tablet Take 1 tablet (80 mg total) by mouth daily at 6 PM. 30 tablet 6  . carvedilol (COREG) 6.25 MG tablet Take 1  tablet (6.25 mg total) by mouth 2 (two) times daily with a meal. 60 tablet 6  . clopidogrel (PLAVIX) 75 MG tablet Take 1 tablet (75 mg total) by mouth daily with breakfast. 30 tablet 11  . Multiple Vitamin (MULTIVITAMIN WITH MINERALS) TABS Take 1 tablet by mouth daily.    . nitroGLYCERIN (NITROSTAT) 0.4 MG SL tablet Place 1 tablet (0.4 mg total) under the tongue every 5 (five) minutes as needed for chest pain. 25 tablet 4  . sildenafil (VIAGRA) 100 MG tablet TAKE 1 TABLET BY MOUTH EVERY DAY AS NEEDED (Patient not taking: Reported on 06/11/2018) 6 tablet 2  . spironolactone (ALDACTONE) 25 MG tablet Take 1 tablet (25 mg total) by mouth daily. 90 tablet 3  . warfarin (COUMADIN) 5 MG tablet Take as directed by Coumadin clinic 40 tablet 0   No current facility-administered medications for this visit.     Allergies:   Patient has no known allergies.   Social History:  The patient  reports that he has quit smoking. He has never used smokeless tobacco. He reports current alcohol use of about 3.0 standard drinks of alcohol per week.   Family History:  The patient's  family history includes Heart disease in his father; Hypertension in his brother and brother; Stroke in his brother and mother.   ROS:  Please see the history of present illness.   All other systems are personally reviewed and  negative.    Exam:    Vital Signs:  BP - 105/70, P - 69  Well appearing, alert and conversant, regular work of breathing,  good skin color Eyes- anicteric, neuro- grossly intact, skin- no apparent rash or lesions or cyanosis, mouth- oral mucosa is pink   Labs/Other Tests and Data Reviewed:    Recent Labs: 05/07/2018: ALT 53; TSH 1.583 05/12/2018: BUN 14; Creatinine, Ser 1.07; Magnesium 1.8; Potassium 4.0; Sodium 136 05/14/2018: Hemoglobin 15.6; Platelets 471   Wt Readings from Last 3 Encounters:  06/11/18 198 lb (89.8 kg)  05/14/18 199 lb 1.6 oz (90.3 kg)  03/09/18 210 lb (95.3 kg)     Other studies  personally reviewed:   ASSESSMENT & PLAN:    1.  Chronic systolic heart failure - his symptoms are class 2. I have asked him to start low dose lisinopril. He will take 2.5 mg daily for a week then twice daily.  2. CAD - he is s/p anterior MI and his presentation was delayed. I have asked him to continue his current meds. We discussed ICD insertion for primary prevention. 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 He would like to wait. I will ask him to repeat his 2D echo in 3 months. If his EF remains down as I suspect that it will, he would then be more likely to undergo ICD insertion. 3. COVID 19 screen The patient denies symptoms of COVID 19 at this time.  The importance of social distancing was discussed today.  Follow-up:  3 months Next remote: n/a  Current medicines are reviewed at length with the patient today.   The patient does not have concerns regarding his medicines.  The following changes were made today:  none  Labs/ tests ordered today include: none No orders of the defined types were placed in this encounter.    Patient Risk:  after full review of this patients clinical status, I feel that they are at moderate risk at this time.  Today, I have spent 35 minutes with the patient with telehealth technology discussing all of the above .    Signed, Cristopher Peru, MD  07/24/2018 2:23 PM     [address] [place] [address] [phone] (office) [phone] (fax)

## 2018-07-24 NOTE — Telephone Encounter (Signed)
Advised the pharmacy that [name] started the patient on Lisinopril and did not alter the aldactone prescription.

## 2018-07-24 NOTE — Telephone Encounter (Signed)
New message:   Pharmacy calling concering the medication. Please call the pharmacy.

## 2018-07-25 NOTE — Telephone Encounter (Signed)
Spoke with medical records, she reached out to the patient and stated they have not receive the paperwork yet.

## 2018-08-01 ENCOUNTER — Telehealth: Payer: Self-pay | Admitting: Cardiology

## 2018-08-01 NOTE — Telephone Encounter (Signed)
New Message   Patient called in reference to paperwork that was filled out incorrectly going to [place]. He states that it needs to have actual dates instead of saying TBD. He feels like he should not have to pay $25 again for something that was done incorrectly. Please call to discuss.

## 2018-08-02 ENCOUNTER — Telehealth: Payer: Self-pay | Admitting: Cardiology

## 2018-08-02 NOTE — Telephone Encounter (Signed)
New Message:    Patient calling concering some paper work that was filled out and he needs a t date on the paper. Please call patient concering paper work. Patient is not getting paid. [phone].

## 2018-08-03 NOTE — Telephone Encounter (Signed)
Spoke with the patient, he expressed understanding that we corrected the paperwork to extend through 10/30/18.

## 2018-08-03 NOTE — Telephone Encounter (Signed)
See other encounter.

## 2018-08-06 NOTE — Telephone Encounter (Signed)
Spoke with the patient, gave him the phone number of [employer] to call for a refund. [phone].

## 2018-08-10 ENCOUNTER — Telehealth: Payer: Self-pay | Admitting: Cardiology

## 2018-08-10 NOTE — Telephone Encounter (Signed)
Sent My-Chart message

## 2018-08-10 NOTE — Telephone Encounter (Signed)
Patient was calling to check on the status of some paperwork. He wanted to know if Dr. Radford Pax had signed the paperwork yet. Please call.

## 2018-08-12 ENCOUNTER — Other Ambulatory Visit: Payer: Self-pay | Admitting: Cardiology

## 2018-08-14 ENCOUNTER — Telehealth (HOSPITAL_COMMUNITY): Payer: Self-pay

## 2018-08-14 ENCOUNTER — Telehealth: Payer: Self-pay

## 2018-08-14 NOTE — Telephone Encounter (Signed)

## 2018-08-14 NOTE — Telephone Encounter (Signed)

## 2018-08-15 ENCOUNTER — Encounter (HOSPITAL_COMMUNITY)
Admission: RE | Admit: 2018-08-15 | Discharge: 2018-08-15 | Disposition: A | Discharge status: Home / Self Care | Payer: Self-pay | Source: Ambulatory Visit

## 2018-08-15 ENCOUNTER — Telehealth (HOSPITAL_COMMUNITY): Payer: Self-pay

## 2018-08-15 ENCOUNTER — Telehealth: Payer: Self-pay | Admitting: *Deleted

## 2018-08-15 ENCOUNTER — Encounter (HOSPITAL_COMMUNITY)
Admission: RE | Admit: 2018-08-15 | Discharge: 2018-08-15 | Disposition: A | Discharge status: Home / Self Care | Payer: Self-pay | Source: Ambulatory Visit | Attending: Cardiology | Admitting: Cardiology

## 2018-08-15 NOTE — Telephone Encounter (Signed)
Pt was returned call and was able to download the app for virtual CR successfully. Pt stated he would like information from nutritionist on low sodium and weight management. Will report to nutritionist regarding this.

## 2018-08-15 NOTE — Telephone Encounter (Signed)
Called and spoke to pt regarding Virtual Cardiac Rehab.  Pt  was able to download the [facility] app on their smart device with no issues. Pt set up their account and received the following welcome message -"Welcome to the [facility] program. We hope that you will find the exercise program beneficial in your recovery process. Our staff is available to assist with any questions/concerns about your exercise routine. Best wishes". Brief orientation provided to with the advisement to watch the "Intro to Rehab" series located under the Resource tab. Pt verbalized understanding. Will continue to follow and monitor pt progress with feedback as needed.

## 2018-08-15 NOTE — Telephone Encounter (Signed)
    COVID-19 Pre-Screening Questions:  . In the past 7 to 10 days have you had a cough,  shortness of breath, headache, congestion, fever (100 or greater) body aches, chills, sore throat, or sudden loss of taste or sense of smell? . Have you been around anyone with known Covid 19. . Have you been around anyone who is awaiting Covid 19 test results in the past 7 to 10 days? . Have you been around anyone who has been exposed to Covid 19, or has mentioned symptoms of Covid 19 within the past 7 to 10 days?  If you have any concerns/questions about symptoms patients report during screening (either on the phone or at threshold). Contact the provider seeing the patient or DOD for further guidance.  If neither are available contact a member of the leadership team.            Contacted patient via phone call. Covid 19 questions were answered no. Has a mask. KB  

## 2018-08-15 NOTE — Telephone Encounter (Signed)
Phone call to pt to set up the virtual CR application. Pt was unable to successfully download and set up app. Pt stated his verification code was not working. Will reschedule Pt for another day. Will try and figure out why  Pt is experiencing problems.

## 2018-08-16 ENCOUNTER — Other Ambulatory Visit: Payer: 59 | Admitting: *Deleted

## 2018-08-16 ENCOUNTER — Other Ambulatory Visit: Payer: Self-pay

## 2018-08-16 DIAGNOSIS — I255 Ischemic cardiomyopathy: Secondary | ICD-10-CM

## 2018-08-17 LAB — BASIC METABOLIC PANEL
BUN/Creatinine Ratio: 16 (ref 10–24)
BUN: 18 mg/dL (ref 8–27)
CO2: 19 mmol/L — ABNORMAL LOW (ref 20–29)
Calcium: 9.3 mg/dL (ref 8.6–10.2)
Chloride: 106 mmol/L (ref 96–106)
Creatinine, Ser: 1.12 mg/dL (ref 0.76–1.27)
GFR calc Af Amer: 82 mL/min/{1.73_m2} (ref >59)
GFR calc non Af Amer: 71 mL/min/{1.73_m2} (ref >59)
Glucose: 101 mg/dL — ABNORMAL HIGH (ref 65–99)
Potassium: 4.7 mmol/L (ref 3.5–5.2)
Sodium: 139 mmol/L (ref 134–144)

## 2018-08-20 ENCOUNTER — Telehealth: Payer: Self-pay | Admitting: Internal Medicine

## 2018-08-20 ENCOUNTER — Encounter (HOSPITAL_COMMUNITY)
Admission: RE | Admit: 2018-08-20 | Discharge: 2018-08-20 | Disposition: A | Discharge status: Home / Self Care | Payer: Self-pay | Source: Ambulatory Visit | Attending: Cardiology | Admitting: Cardiology

## 2018-08-20 ENCOUNTER — Telehealth (HOSPITAL_COMMUNITY): Payer: Self-pay

## 2018-08-20 NOTE — Telephone Encounter (Signed)
Called pt to discuss heart healthy eating. Pt has made some changes to diet (complex carbs, lean meat, watches sodium consumed). Recommended pt make the following heart healthy changes a permanent part of dietary pattern:  1.Eat a balanced diet with whole grains, fruits, vegetables, lean protein sources.  2. Choose heart healthy unsaturated fats. Limit saturated fats, trans fats. Eat more plant based meals using beans and soy foods for protein. 3. Eat whole unprocessed foods to limit the amount of sodium (salt) consumed. 4. Choose a consistent amount of carbohydrates at each meal and snack.  5. Limit refined carbohydrates especially sugar, sweets, and sugar-sweetened beverages. 6. Limit alcohol consumption.  Patient was engaged in discussion today, and open to trying dietitian recommendations. Praised pt for open mindedness and willingness to try new suggestions, encouraged pt to keep up the good work.   Spent 30 minutes discussing heart heatlhy eating with patient today   Laurina Bustle, MS, RD, LDN 08/20/2018 2:44 PM

## 2018-08-20 NOTE — Telephone Encounter (Signed)
Courtney from [employer] called stating she needs a Chief Technology Officer.

## 2018-08-21 ENCOUNTER — Ambulatory Visit (INDEPENDENT_AMBULATORY_CARE_PROVIDER_SITE_OTHER): Payer: 59

## 2018-08-21 ENCOUNTER — Other Ambulatory Visit: Payer: Self-pay

## 2018-08-21 DIAGNOSIS — Z5181 Encounter for therapeutic drug level monitoring: Secondary | ICD-10-CM

## 2018-08-21 DIAGNOSIS — I2102 ST elevation (STEMI) myocardial infarction involving left anterior descending coronary artery: Secondary | ICD-10-CM | POA: Diagnosis not present

## 2018-08-21 LAB — POCT INR: INR: 2.3 (ref 2.0–3.0)

## 2018-08-21 NOTE — Telephone Encounter (Signed)
F/U  Message          Loma Sousa from [employer] called again to check the status of the order for the patient. Loma Sousa verified that the patient is still wearing the Life 'Vest and would like the last OV note faxed over to her as well. Fax # [phone]

## 2018-08-21 NOTE — Patient Instructions (Signed)
Description   Continue on same dosage 1 tablet daily except 1.5 tablets on Tuesdays.  Recheck INR in 6 weeks. Coumadin Clinic [phone] Main [phone]

## 2018-08-21 NOTE — Telephone Encounter (Signed)
Patient needs to proceed with ICD implant. LifeVest only for 3 months on max medical therapy and patient has already had repeat echo showing no improvement in his LVF and therefore needs ICD.

## 2018-08-21 NOTE — Telephone Encounter (Signed)
Returned call to [place] with [employer].  Advised that per [name] he would not being signing orders for Pt to continue to wear a lifevest.  At Dr. Tanna Furry consult with Pt Pt did not indicate he was wearing a lifevest and Pt did not want to proceed with ICD implantation.  Tory Emerald of Dr. Tanna Furry decision.  Advised would forward this to his primary cardiologist to determine if she wanted to continue to sign orders for lifevest.  Also will fax Dr. Tanna Furry consult note to [place].

## 2018-08-24 NOTE — Telephone Encounter (Signed)
Left a message to call back.

## 2018-08-24 NOTE — Telephone Encounter (Signed)
Spoke with the patient, advised him about the Lifevest, he was upset with the lack of communication. I advised that we were sorry about the break down in communication and I updated him on the next steps.

## 2018-09-03 ENCOUNTER — Telehealth: Payer: Self-pay | Admitting: *Deleted

## 2018-09-03 NOTE — Telephone Encounter (Signed)
Per Dr Bronson Ing, pt will not continue [place]

## 2018-10-02 ENCOUNTER — Ambulatory Visit (INDEPENDENT_AMBULATORY_CARE_PROVIDER_SITE_OTHER): Payer: 59 | Admitting: *Deleted

## 2018-10-02 ENCOUNTER — Other Ambulatory Visit: Payer: Self-pay

## 2018-10-02 DIAGNOSIS — Z5181 Encounter for therapeutic drug level monitoring: Secondary | ICD-10-CM

## 2018-10-02 DIAGNOSIS — I2102 ST elevation (STEMI) myocardial infarction involving left anterior descending coronary artery: Secondary | ICD-10-CM | POA: Diagnosis not present

## 2018-10-02 LAB — POCT INR: INR: 3.3 — AB (ref 2.0–3.0)

## 2018-10-02 NOTE — Patient Instructions (Signed)
Description   Hold coumadin today, then continue on same dosage 1 tablet daily except 1.5 tablets on Tuesdays.  Recheck INR in 3 weeks. Coumadin Clinic [phone] Main [phone]

## 2018-10-23 ENCOUNTER — Ambulatory Visit (INDEPENDENT_AMBULATORY_CARE_PROVIDER_SITE_OTHER): Payer: 59 | Admitting: *Deleted

## 2018-10-23 ENCOUNTER — Other Ambulatory Visit: Payer: Self-pay

## 2018-10-23 DIAGNOSIS — Z5181 Encounter for therapeutic drug level monitoring: Secondary | ICD-10-CM

## 2018-10-23 DIAGNOSIS — I2102 ST elevation (STEMI) myocardial infarction involving left anterior descending coronary artery: Secondary | ICD-10-CM

## 2018-10-23 LAB — POCT INR: INR: 2.9 (ref 2.0–3.0)

## 2018-10-23 NOTE — Patient Instructions (Signed)
Description    Continue on same dosage 1 tablet daily except 1.5 tablets on Tuesdays.  Recheck INR in 4 weeks. Coumadin Clinic [phone] Main [phone]

## 2018-10-31 ENCOUNTER — Other Ambulatory Visit (HOSPITAL_COMMUNITY): Payer: 59

## 2018-11-07 ENCOUNTER — Ambulatory Visit (HOSPITAL_COMMUNITY): Payer: 59 | Attending: Cardiology

## 2018-11-07 ENCOUNTER — Other Ambulatory Visit: Payer: Self-pay

## 2018-11-07 DIAGNOSIS — I255 Ischemic cardiomyopathy: Secondary | ICD-10-CM | POA: Insufficient documentation

## 2018-11-07 MED ORDER — PERFLUTREN LIPID MICROSPHERE
1.0000 mL | INTRAVENOUS | Status: AC | PRN
  Administered 2018-11-07: 12:00:00 1 mL via INTRAVENOUS

## 2018-11-13 ENCOUNTER — Telehealth (HOSPITAL_COMMUNITY): Payer: Self-pay

## 2018-11-13 ENCOUNTER — Encounter (HOSPITAL_COMMUNITY)
Admission: RE | Admit: 2018-11-13 | Discharge: 2018-11-13 | Disposition: A | Discharge status: Home / Self Care | Payer: Self-pay | Source: Ambulatory Visit | Attending: Cardiology | Admitting: Cardiology

## 2018-11-13 NOTE — Telephone Encounter (Signed)
Phone call to Pt to inquire about extension of virtual CR program. Pt has been logging exercise and vital signs every day. Pt stated he would like to continue with the virtual CR program. Extension was made.

## 2018-11-14 ENCOUNTER — Ambulatory Visit (INDEPENDENT_AMBULATORY_CARE_PROVIDER_SITE_OTHER): Payer: 59 | Admitting: Internal Medicine

## 2018-11-14 ENCOUNTER — Other Ambulatory Visit: Payer: Self-pay

## 2018-11-14 ENCOUNTER — Encounter: Payer: Self-pay | Admitting: Internal Medicine

## 2018-11-14 DIAGNOSIS — I5022 Chronic systolic (congestive) heart failure: Secondary | ICD-10-CM | POA: Diagnosis not present

## 2018-11-14 LAB — CBC WITH DIFFERENTIAL/PLATELET
Basophils Absolute: 0.1 10*3/uL (ref 0.0–0.2)
Basos: 1 %
EOS (ABSOLUTE): 0.3 10*3/uL (ref 0.0–0.4)
Eos: 6 %
Hematocrit: 45.2 % (ref 37.5–51.0)
Hemoglobin: 15.5 g/dL (ref 13.0–17.7)
Immature Grans (Abs): 0 10*3/uL (ref 0.0–0.1)
Immature Granulocytes: 0 %
Lymphocytes Absolute: 2.2 10*3/uL (ref 0.7–3.1)
Lymphs: 38 %
MCH: 27.8 pg (ref 26.6–33.0)
MCHC: 34.3 g/dL (ref 31.5–35.7)
MCV: 81 fL (ref 79–97)
Monocytes Absolute: 0.6 10*3/uL (ref 0.1–0.9)
Monocytes: 11 %
Neutrophils Absolute: 2.6 10*3/uL (ref 1.4–7.0)
Neutrophils: 44 %
Platelets: 263 10*3/uL (ref 150–450)
RBC: 5.57 x10E6/uL (ref 4.14–5.80)
RDW: 13.8 % (ref 11.6–15.4)
WBC: 5.8 10*3/uL (ref 3.4–10.8)

## 2018-11-14 LAB — BASIC METABOLIC PANEL
BUN/Creatinine Ratio: 10 (ref 10–24)
BUN: 10 mg/dL (ref 8–27)
CO2: 21 mmol/L (ref 20–29)
Calcium: 9.2 mg/dL (ref 8.6–10.2)
Chloride: 104 mmol/L (ref 96–106)
Creatinine, Ser: 0.97 mg/dL (ref 0.76–1.27)
GFR calc Af Amer: 98 mL/min/{1.73_m2} (ref >59)
GFR calc non Af Amer: 84 mL/min/{1.73_m2} (ref >59)
Glucose: 93 mg/dL (ref 65–99)
Potassium: 4.3 mmol/L (ref 3.5–5.2)
Sodium: 137 mmol/L (ref 134–144)

## 2018-11-14 NOTE — Progress Notes (Signed)
HPI Mr. Venecia returns today for followup. He is a pleasant 58 yo man with a h/o chronic systolic heart failure, s/p MI, HTN, and dyslipidemia. He has been on maximal medical therapy after his MI and revascularization for over 6 months. His repeat echo demonstrates and EF of 25%. He has class 2 symptoms. He has not had syncope.  No Known Allergies   Current Outpatient Medications  Medication Sig Dispense Refill  . aspirin 81 MG EC tablet Take 1 tablet (81 mg total) by mouth daily. 90 tablet 3  . atorvastatin (LIPITOR) 80 MG tablet Take 1 tablet (80 mg total) by mouth daily at 6 PM. 30 tablet 6  . carvedilol (COREG) 6.25 MG tablet Take 1 tablet (6.25 mg total) by mouth 2 (two) times daily with a meal. 60 tablet 6  . clopidogrel (PLAVIX) 75 MG tablet Take 1 tablet (75 mg total) by mouth daily with breakfast. 30 tablet 11  . lisinopril (ZESTRIL) 5 MG tablet Take 1/2 tablet (2.5 mg) by mouth for 1 week.  Then increase to 1/2 tablet by mouth twice a day. 90 tablet 3  . Multiple Vitamin (MULTIVITAMIN WITH MINERALS) TABS Take 1 tablet by mouth daily.    . nitroGLYCERIN (NITROSTAT) 0.4 MG SL tablet Place 1 tablet (0.4 mg total) under the tongue every 5 (five) minutes as needed for chest pain. 25 tablet 4  . sildenafil (VIAGRA) 100 MG tablet TAKE 1 TABLET BY MOUTH EVERY DAY AS NEEDED 6 tablet 2  . warfarin (COUMADIN) 5 MG tablet TAKE AS DIRECTED BY  COUMADIN  CLINIC 40 tablet 3  . spironolactone (ALDACTONE) 25 MG tablet Take 1 tablet (25 mg total) by mouth daily. 90 tablet 3   No current facility-administered medications for this visit.      Past Medical History:  Diagnosis Date  . Hyperlipidemia   . Hypertension 2015    ROS:   All systems reviewed and negative except as noted in the HPI.   Past Surgical History:  Procedure Laterality Date  . COLONOSCOPY  2013   "normal" per patient, [place]  . LEFT HEART CATH AND CORONARY ANGIOGRAPHY N/A 05/06/2018   Procedure: LEFT HEART CATH  AND CORONARY ANGIOGRAPHY;  Surgeon: [name], MD;  Location: [facility];  Service: Cardiovascular;  Laterality: N/A;     Family History  Problem Relation Age of Onset  . Stroke Mother   . Heart disease Father   . Hypertension Brother   . Stroke Brother   . Hypertension Brother   . Cancer Neg Hx      Social History   Socioeconomic History  . Marital status: Married    Spouse name: Not on file  . Number of children: Not on file  . Years of education: Not on file  . Highest education level: Not on file  Occupational History  . Not on file  Social Needs  . Financial resource strain: Not on file  . Food insecurity    Worry: Not on file    Inability: Not on file  . Transportation needs    Medical: Not on file    Non-medical: Not on file  Tobacco Use  . Smoking status: Former Research scientist (life sciences)  . Smokeless tobacco: Never Used  . Tobacco comment: quit smoking 58 years old ago  Substance and Sexual Activity  . Alcohol use: Yes    Alcohol/week: 3.0 standard drinks    Types: 3 Cans of beer per week  Comment: on weekend   . Drug use: Not on file  . Sexual activity: Not on file  Lifestyle  . Physical activity    Days per week: Not on file    Minutes per session: Not on file  . Stress: Not on file  Relationships  . Social Herbalist on phone: Not on file    Gets together: Not on file    Attends religious service: Not on file    Active member of club or organization: Not on file    Attends meetings of clubs or organizations: Not on file    Relationship status: Not on file  . Intimate partner violence    Fear of current or ex partner: Not on file    Emotionally abused: Not on file    Physically abused: Not on file    Forced sexual activity: Not on file  Other Topics Concern  . Not on file  Social History Narrative   Married, 10 children, 4 grandchildren.   Surveyor, mining at [employer].   Exercise with walking on treadmill few days per week.   Member  of [employer].    06/2017     BP 106/68   Pulse 74   Ht 5\' 8"  (1.727 m)   Wt 196 lb 12.8 oz (89.3 kg)   SpO2 98%   BMI 29.92 kg/m   Physical Exam:  Well appearing NAD HEENT: Unremarkable Neck:  No JVD, no thyromegally Lymphatics:  No adenopathy Back:  No CVA tenderness Lungs:  Clear with no wheezes HEART:  Regular rate rhythm, no murmurs, no rubs, no clicks Abd:  soft, positive bowel sounds, no organomegally, no rebound, no guarding Ext:  2 plus pulses, no edema, no cyanosis, no clubbing Skin:  No rashes no nodules Neuro:  CN II through XII intact, motor grossly intact  EKG nsr with anterior MI   Assess/Plan: 1. Chronic systolic heart failure - he has had class 2 symptoms. I have recommended ICD insertion as he has been on maximal medical therapy and his EF is still 25%. The risks/benefits/goals/expectations of ICD insertion were reviewed and he wishes to proceed. 2. CAD - he denies anginal symptoms. We will follow. 3. Dyslipidemia - he remains on statin therapy.  Mikle Bosworth.D.

## 2018-11-14 NOTE — Patient Instructions (Addendum)
Medication Instructions:  Your physician recommends that you continue on your current medications as directed. Please refer to the Current Medication list given to you today.  Labwork: You will get lab work today:  BMP and CBC  Testing/Procedures: None ordered.  Follow-Up:  You will follow up with device clinic 10-14 days after your procedure.  Your physician wants you to follow-up in: 91 days with [name].     Any Other Special Instructions Will Be Listed Below (If Applicable).  If you need a refill on your cardiac medications before your next appointment, please call your pharmacy.

## 2018-11-20 ENCOUNTER — Other Ambulatory Visit: Payer: Self-pay

## 2018-11-20 ENCOUNTER — Ambulatory Visit (INDEPENDENT_AMBULATORY_CARE_PROVIDER_SITE_OTHER): Payer: 59 | Admitting: *Deleted

## 2018-11-20 DIAGNOSIS — Z5181 Encounter for therapeutic drug level monitoring: Secondary | ICD-10-CM | POA: Diagnosis not present

## 2018-11-20 DIAGNOSIS — I2102 ST elevation (STEMI) myocardial infarction involving left anterior descending coronary artery: Secondary | ICD-10-CM

## 2018-11-20 LAB — POCT INR: INR: 3.2 — AB (ref 2.0–3.0)

## 2018-11-20 NOTE — Patient Instructions (Addendum)
Description    Hold today, then continue on same dosage 1 tablet daily except 1.5 tablets on Tuesdays. Follow instructions [name] gives you for your procedure. Recheck INR 1 week post procedure. [facility]in Clinic [phone] Main [phone]

## 2018-11-30 ENCOUNTER — Telehealth (HOSPITAL_COMMUNITY): Payer: Self-pay

## 2018-11-30 ENCOUNTER — Other Ambulatory Visit: Payer: Self-pay | Admitting: Cardiology

## 2018-11-30 ENCOUNTER — Encounter (HOSPITAL_COMMUNITY)
Admission: RE | Admit: 2018-11-30 | Discharge: 2018-11-30 | Disposition: A | Discharge status: Home / Self Care | Payer: Self-pay | Source: Ambulatory Visit | Attending: Cardiology | Admitting: Cardiology

## 2018-11-30 MED ORDER — ATORVASTATIN CALCIUM 80 MG PO TABS: 80.0000 mg | ORAL_TABLET | Freq: Every day | ORAL | 6 refills | Status: DC

## 2018-11-30 NOTE — Telephone Encounter (Signed)
Phone call returned by Pt. Pt was returning call about symptoms logged during exercise on 11/29/18 on the virtual CR app. Pt stated he had a irregular heart beat show up on his BP machine. Pt was advised to contact cardiologist about this reading. Will follow up with Pt.

## 2018-11-30 NOTE — Telephone Encounter (Signed)
Phone call to Pt to inquire about symptoms he logged during exercise on 11/29/18. Pt logged symptoms through the virtual CR app. Pt did not answer and message was left for Pt to return call.

## 2018-12-03 ENCOUNTER — Telehealth: Payer: Self-pay | Admitting: Internal Medicine

## 2018-12-03 ENCOUNTER — Other Ambulatory Visit: Payer: Self-pay | Admitting: Cardiology

## 2018-12-03 ENCOUNTER — Telehealth (HOSPITAL_COMMUNITY): Payer: Self-pay | Admitting: Licensed Clinical Social Worker

## 2018-12-03 MED ORDER — ATORVASTATIN CALCIUM 80 MG PO TABS: 80.0000 mg | ORAL_TABLET | Freq: Every day | ORAL | 6 refills | Status: DC

## 2018-12-03 NOTE — Telephone Encounter (Signed)
Cancelled appt d/t Pt lost insurance.  Advised Pt to apply for medicaid.  Will also attempt to get Pt assistance with [facility].

## 2018-12-03 NOTE — Telephone Encounter (Signed)
CSW consulted to speak with pt regarding recent lost of insurance.  CSW called pt to discuss.  Patient state she has been unable to work for 6 months due to health concerns and has been on short term disability during that time which included insurance benefits- these benefits ran out last week so patient is no longer insured.  Pt applied for disability but just received a letter that he was not approved- he plans on appealing this decision.  Pt also applied for Medicaid this week but he would not be approved unless deemed disability.  CSW discussed possibility of appliying for [facility] insurance as he officially lost his job last week- he should qualify for special enrollment or could at least apply in November during open enrollment.  CSW also discussed [employer] application which would help with [facility] bills.  CSW emailed pt copy of [facility] Assistance application as well as link to [employer] website to see pricing for [employer].  CSW provided contact information to address any concerns or questions regarding application.  CSW will continue to follow and assist as needed  Jorge Ny, [facility] Desk#: [phone] Cell#: [phone]

## 2018-12-03 NOTE — Telephone Encounter (Signed)
Follow Up:   Pt said he said Cath, He made a mistake, it wasan implant for  for his Defibrillator

## 2018-12-03 NOTE — Telephone Encounter (Signed)
New Message:      Pt says he need to cancel his Cath scheduled for 110-9-20. He does not have insurance at this time.

## 2018-12-04 ENCOUNTER — Other Ambulatory Visit (HOSPITAL_COMMUNITY): Payer: 59

## 2018-12-07 ENCOUNTER — Ambulatory Visit (HOSPITAL_COMMUNITY): Admission: RE | Admit: 2018-12-07 | Payer: 59 | Source: Home / Self Care | Admitting: Internal Medicine

## 2018-12-07 ENCOUNTER — Encounter (HOSPITAL_COMMUNITY): Admission: RE | Payer: 59 | Source: Home / Self Care

## 2018-12-07 SURGERY — ICD IMPLANT

## 2018-12-12 ENCOUNTER — Other Ambulatory Visit: Payer: Self-pay | Admitting: Internal Medicine

## 2018-12-12 MED ORDER — CARVEDILOL 6.25 MG PO TABS: 6.2500 mg | ORAL_TABLET | Freq: Two times a day (BID) | ORAL | 11 refills | Status: DC

## 2018-12-14 ENCOUNTER — Ambulatory Visit (INDEPENDENT_AMBULATORY_CARE_PROVIDER_SITE_OTHER): Payer: Self-pay | Admitting: *Deleted

## 2018-12-14 ENCOUNTER — Other Ambulatory Visit: Payer: Self-pay

## 2018-12-14 DIAGNOSIS — I2102 ST elevation (STEMI) myocardial infarction involving left anterior descending coronary artery: Secondary | ICD-10-CM

## 2018-12-14 DIAGNOSIS — Z5181 Encounter for therapeutic drug level monitoring: Secondary | ICD-10-CM

## 2018-12-14 LAB — POCT INR: INR: 3.6 — AB (ref 2.0–3.0)

## 2018-12-14 NOTE — Patient Instructions (Signed)
Description   Do not take any Coumadin today then start taking 1 tablet daily. Recheck INR in 10 days. [facility] Clinic [phone] Main [phone]

## 2018-12-20 ENCOUNTER — Ambulatory Visit: Payer: 59

## 2018-12-25 ENCOUNTER — Other Ambulatory Visit: Payer: Self-pay

## 2018-12-25 ENCOUNTER — Ambulatory Visit (INDEPENDENT_AMBULATORY_CARE_PROVIDER_SITE_OTHER): Payer: Self-pay

## 2018-12-25 DIAGNOSIS — Z5181 Encounter for therapeutic drug level monitoring: Secondary | ICD-10-CM

## 2018-12-25 DIAGNOSIS — I2102 ST elevation (STEMI) myocardial infarction involving left anterior descending coronary artery: Secondary | ICD-10-CM

## 2018-12-25 LAB — POCT INR: INR: 2.4 (ref 2.0–3.0)

## 2018-12-25 NOTE — Patient Instructions (Signed)
Description   Continue on same dosage 1 tablet daily. Recheck INR in 3 weeks. [facility] Clinic [phone] Main [phone]      

## 2019-01-01 ENCOUNTER — Encounter (HOSPITAL_COMMUNITY)
Admission: RE | Admit: 2019-01-01 | Discharge: 2019-01-01 | Disposition: A | Discharge status: Home / Self Care | Payer: Self-pay | Source: Ambulatory Visit | Attending: Cardiology | Admitting: Cardiology

## 2019-01-10 ENCOUNTER — Telehealth: Payer: Self-pay | Admitting: Cardiology

## 2019-01-10 NOTE — Telephone Encounter (Signed)
New message  Margreta Journey from [employer] is calling in to check to see if patient went through with the surgery and if there has been a return to work date established. Please give Margreta Journey a call back at  [phone].

## 2019-01-15 ENCOUNTER — Ambulatory Visit (INDEPENDENT_AMBULATORY_CARE_PROVIDER_SITE_OTHER): Payer: Self-pay | Admitting: *Deleted

## 2019-01-15 ENCOUNTER — Other Ambulatory Visit: Payer: Self-pay

## 2019-01-15 DIAGNOSIS — Z5181 Encounter for therapeutic drug level monitoring: Secondary | ICD-10-CM

## 2019-01-15 DIAGNOSIS — I2102 ST elevation (STEMI) myocardial infarction involving left anterior descending coronary artery: Secondary | ICD-10-CM

## 2019-01-15 LAB — POCT INR: INR: 1.9 — AB (ref 2.0–3.0)

## 2019-01-15 NOTE — Patient Instructions (Signed)
Description   Today take 1.5 tablets then continue on same dosage 1 tablet daily. Recheck INR in 3-4 weeks. Coumadin Clinic [phone] Main [phone]

## 2019-01-18 ENCOUNTER — Other Ambulatory Visit: Payer: Self-pay | Admitting: Cardiology

## 2019-01-21 ENCOUNTER — Telehealth: Payer: Self-pay

## 2019-01-21 DIAGNOSIS — I255 Ischemic cardiomyopathy: Secondary | ICD-10-CM

## 2019-01-21 DIAGNOSIS — I5022 Chronic systolic (congestive) heart failure: Secondary | ICD-10-CM

## 2019-01-21 NOTE — Telephone Encounter (Signed)
Pt scheduled for ICD on December 10  Work up complete

## 2019-01-22 ENCOUNTER — Telehealth: Payer: Self-pay | Admitting: Cardiology

## 2019-01-22 NOTE — Telephone Encounter (Signed)
Disability paperwork received from [employer]. Placed in Dr. Theodosia Blender box for review. 01/22/19 vlm

## 2019-02-01 ENCOUNTER — Other Ambulatory Visit (HOSPITAL_COMMUNITY): Payer: Medicaid Other

## 2019-02-04 ENCOUNTER — Other Ambulatory Visit: Payer: Self-pay

## 2019-02-04 ENCOUNTER — Other Ambulatory Visit: Payer: Self-pay | Admitting: *Deleted

## 2019-02-04 ENCOUNTER — Inpatient Hospital Stay (HOSPITAL_COMMUNITY): Admission: RE | Admit: 2019-02-04 | Payer: Medicaid Other | Source: Ambulatory Visit

## 2019-02-04 DIAGNOSIS — I255 Ischemic cardiomyopathy: Secondary | ICD-10-CM

## 2019-02-04 DIAGNOSIS — I5022 Chronic systolic (congestive) heart failure: Secondary | ICD-10-CM

## 2019-02-04 DIAGNOSIS — Z20822 Contact with and (suspected) exposure to covid-19: Secondary | ICD-10-CM

## 2019-02-04 LAB — CBC WITH DIFFERENTIAL/PLATELET
Basophils Absolute: 0 10*3/uL (ref 0.0–0.2)
Basos: 0 %
EOS (ABSOLUTE): 0.3 10*3/uL (ref 0.0–0.4)
Eos: 5 %
Hematocrit: 43.6 % (ref 37.5–51.0)
Hemoglobin: 15.6 g/dL (ref 13.0–17.7)
Lymphocytes Absolute: 2.9 10*3/uL (ref 0.7–3.1)
Lymphs: 44 %
MCH: 29.1 pg (ref 26.6–33.0)
MCHC: 35.8 g/dL — ABNORMAL HIGH (ref 31.5–35.7)
MCV: 81 fL (ref 79–97)
Monocytes Absolute: 0.8 10*3/uL (ref 0.1–0.9)
Monocytes: 12 %
Neutrophils Absolute: 2.7 10*3/uL (ref 1.4–7.0)
Neutrophils: 39 %
Platelets: 249 10*3/uL (ref 150–450)
RBC: 5.36 x10E6/uL (ref 4.14–5.80)
RDW: 15 % (ref 11.6–15.4)
WBC: 6.7 10*3/uL (ref 3.4–10.8)

## 2019-02-04 LAB — BASIC METABOLIC PANEL
BUN/Creatinine Ratio: 9 — ABNORMAL LOW (ref 10–24)
BUN: 10 mg/dL (ref 8–27)
CO2: 25 mmol/L (ref 20–29)
Calcium: 9.5 mg/dL (ref 8.6–10.2)
Chloride: 107 mmol/L — ABNORMAL HIGH (ref 96–106)
Creatinine, Ser: 1.09 mg/dL (ref 0.76–1.27)
GFR calc Af Amer: 85 mL/min/{1.73_m2} (ref >59)
GFR calc non Af Amer: 73 mL/min/{1.73_m2} (ref >59)
Glucose: 95 mg/dL (ref 65–99)
Potassium: 4.4 mmol/L (ref 3.5–5.2)
Sodium: 142 mmol/L (ref 134–144)

## 2019-02-04 LAB — PROTIME-INR
INR: 2.8 — ABNORMAL HIGH (ref 0.9–1.2)
Prothrombin Time: 29.4 s — ABNORMAL HIGH (ref 9.1–12.0)

## 2019-02-06 LAB — NOVEL CORONAVIRUS, NAA: SARS-CoV-2, NAA: NOT DETECTED

## 2019-02-07 ENCOUNTER — Other Ambulatory Visit: Payer: Self-pay

## 2019-02-07 ENCOUNTER — Ambulatory Visit (HOSPITAL_COMMUNITY)
Admission: RE | Admit: 2019-02-07 | Discharge: 2019-02-07 | Disposition: A | Discharge status: Home / Self Care | Payer: BC Managed Care – PPO | Attending: Internal Medicine | Admitting: Internal Medicine

## 2019-02-07 ENCOUNTER — Ambulatory Visit (HOSPITAL_COMMUNITY): Payer: BC Managed Care – PPO

## 2019-02-07 ENCOUNTER — Encounter (HOSPITAL_COMMUNITY)
Admission: RE | Disposition: A | Discharge status: Home / Self Care | Payer: Self-pay | Source: Home / Self Care | Attending: Internal Medicine

## 2019-02-07 DIAGNOSIS — Z006 Encounter for examination for normal comparison and control in clinical research program: Secondary | ICD-10-CM | POA: Insufficient documentation

## 2019-02-07 DIAGNOSIS — I255 Ischemic cardiomyopathy: Secondary | ICD-10-CM

## 2019-02-07 DIAGNOSIS — I110 Hypertensive heart disease with heart failure: Secondary | ICD-10-CM | POA: Diagnosis not present

## 2019-02-07 DIAGNOSIS — E785 Hyperlipidemia, unspecified: Secondary | ICD-10-CM | POA: Insufficient documentation

## 2019-02-07 DIAGNOSIS — Z7901 Long term (current) use of anticoagulants: Secondary | ICD-10-CM | POA: Insufficient documentation

## 2019-02-07 DIAGNOSIS — I252 Old myocardial infarction: Secondary | ICD-10-CM | POA: Insufficient documentation

## 2019-02-07 DIAGNOSIS — Z87891 Personal history of nicotine dependence: Secondary | ICD-10-CM | POA: Insufficient documentation

## 2019-02-07 DIAGNOSIS — I2510 Atherosclerotic heart disease of native coronary artery without angina pectoris: Secondary | ICD-10-CM | POA: Diagnosis not present

## 2019-02-07 DIAGNOSIS — Z8249 Family history of ischemic heart disease and other diseases of the circulatory system: Secondary | ICD-10-CM | POA: Insufficient documentation

## 2019-02-07 DIAGNOSIS — I5022 Chronic systolic (congestive) heart failure: Secondary | ICD-10-CM | POA: Diagnosis not present

## 2019-02-07 DIAGNOSIS — Z79899 Other long term (current) drug therapy: Secondary | ICD-10-CM | POA: Insufficient documentation

## 2019-02-07 DIAGNOSIS — Z7982 Long term (current) use of aspirin: Secondary | ICD-10-CM | POA: Insufficient documentation

## 2019-02-07 DIAGNOSIS — Z7902 Long term (current) use of antithrombotics/antiplatelets: Secondary | ICD-10-CM | POA: Insufficient documentation

## 2019-02-07 DIAGNOSIS — Z95810 Presence of automatic (implantable) cardiac defibrillator: Secondary | ICD-10-CM

## 2019-02-07 HISTORY — PX: ICD IMPLANT: EP1208

## 2019-02-07 LAB — PROTIME-INR
INR: 1.9 — ABNORMAL HIGH (ref 0.8–1.2)
Prothrombin Time: 21.3 seconds — ABNORMAL HIGH (ref 11.4–15.2)

## 2019-02-07 SURGERY — ICD IMPLANT

## 2019-02-07 MED ORDER — ACETAMINOPHEN 325 MG PO TABS: 325.0000 mg | ORAL_TABLET | ORAL | Status: DC | PRN

## 2019-02-07 MED ORDER — CEFAZOLIN SODIUM-DEXTROSE 1-4 GM/50ML-% IV SOLN: 1.0000 g | Freq: Four times a day (QID) | INTRAVENOUS | Status: DC

## 2019-02-07 MED ORDER — FENTANYL CITRATE (PF) 100 MCG/2ML IJ SOLN
INTRAMUSCULAR | Status: AC
  Filled 2019-02-07: qty 2

## 2019-02-07 MED ORDER — CEFAZOLIN SODIUM-DEXTROSE 2-4 GM/100ML-% IV SOLN
2.0000 g | INTRAVENOUS | Status: AC
  Administered 2019-02-07: 2 g via INTRAVENOUS

## 2019-02-07 MED ORDER — LIDOCAINE HCL (PF) 1 % IJ SOLN
INTRAMUSCULAR | Status: DC | PRN
  Administered 2019-02-07: 60 mL

## 2019-02-07 MED ORDER — FENTANYL CITRATE (PF) 100 MCG/2ML IJ SOLN
INTRAMUSCULAR | Status: DC | PRN
  Administered 2019-02-07: 12.5 ug via INTRAVENOUS
  Administered 2019-02-07: 25 ug via INTRAVENOUS
  Administered 2019-02-07 (×2): 12.5 ug via INTRAVENOUS

## 2019-02-07 MED ORDER — HEPARIN (PORCINE) IN NACL 1000-0.9 UT/500ML-% IV SOLN
INTRAVENOUS | Status: AC
  Filled 2019-02-07: qty 500

## 2019-02-07 MED ORDER — LIDOCAINE HCL (PF) 1 % IJ SOLN
INTRAMUSCULAR | Status: AC
  Filled 2019-02-07: qty 30

## 2019-02-07 MED ORDER — CEFAZOLIN SODIUM-DEXTROSE 2-4 GM/100ML-% IV SOLN
INTRAVENOUS | Status: AC
  Filled 2019-02-07: qty 100

## 2019-02-07 MED ORDER — MIDAZOLAM HCL 5 MG/5ML IJ SOLN
INTRAMUSCULAR | Status: AC
  Filled 2019-02-07: qty 5

## 2019-02-07 MED ORDER — SODIUM CHLORIDE 0.9 % IV SOLN
INTRAVENOUS | Status: AC
  Filled 2019-02-07: qty 2

## 2019-02-07 MED ORDER — SODIUM CHLORIDE 0.9 % IV SOLN
80.0000 mg | INTRAVENOUS | Status: AC
  Administered 2019-02-07: 13:00:00 80 mg

## 2019-02-07 MED ORDER — ONDANSETRON HCL 4 MG/2ML IJ SOLN: 4.0000 mg | Freq: Four times a day (QID) | INTRAMUSCULAR | Status: DC | PRN

## 2019-02-07 MED ORDER — CHLORHEXIDINE GLUCONATE 4 % EX LIQD: 4.0000 "application " | Freq: Once | CUTANEOUS | Status: DC

## 2019-02-07 MED ORDER — SODIUM CHLORIDE 0.9 % IV SOLN
INTRAVENOUS | Status: DC
  Administered 2019-02-07: 08:00:00 via INTRAVENOUS

## 2019-02-07 MED ORDER — MIDAZOLAM HCL 5 MG/5ML IJ SOLN
INTRAMUSCULAR | Status: DC | PRN
  Administered 2019-02-07 (×5): 1 mg via INTRAVENOUS

## 2019-02-07 MED ORDER — HEPARIN (PORCINE) IN NACL 1000-0.9 UT/500ML-% IV SOLN
INTRAVENOUS | Status: DC | PRN
  Administered 2019-02-07: 500 mL

## 2019-02-07 MED ORDER — IOHEXOL 350 MG/ML SOLN
INTRAVENOUS | Status: DC | PRN
  Administered 2019-02-07: 10 mL

## 2019-02-07 SURGICAL SUPPLY — 8 items
CABLE SURGICAL S-101-97-12 (CABLE) ×3 IMPLANT
GUIDEWIRE ANGLED .035X150CM (WIRE) ×3 IMPLANT
ICD ACTICOR DX (ICD Generator) ×3 IMPLANT
LEAD PLEXA S DX 65/15 414005 (Lead) ×3 IMPLANT
PAD PRO RADIOLUCENT 2001M-C (PAD) ×3 IMPLANT
SHEATH 9FR PRELUDE SNAP 13 (SHEATH) ×3 IMPLANT
SHEATH CLASSIC 9F 25CM (SHEATH) ×3 IMPLANT
TRAY PACEMAKER INSERTION (PACKS) ×3 IMPLANT

## 2019-02-07 NOTE — H&P (Signed)
HPI [name] returns today for followup. He is a pleasant 58 yo man with a h/o chronic systolic heart failure, s/p MI, HTN, and dyslipidemia. He has been on maximal medical therapy after his MI and revascularization for over 6 months. His repeat echo demonstrates and EF of 25%. He has class 2 symptoms. He has not had syncope.  No Known Allergies         Current Outpatient Medications  Medication Sig Dispense Refill  . aspirin 81 MG EC tablet Take 1 tablet (81 mg total) by mouth daily. 90 tablet 3  . atorvastatin (LIPITOR) 80 MG tablet Take 1 tablet (80 mg total) by mouth daily at 6 PM. 30 tablet 6  . carvedilol (COREG) 6.25 MG tablet Take 1 tablet (6.25 mg total) by mouth 2 (two) times daily with a meal. 60 tablet 6  . clopidogrel (PLAVIX) 75 MG tablet Take 1 tablet (75 mg total) by mouth daily with breakfast. 30 tablet 11  . lisinopril (ZESTRIL) 5 MG tablet Take 1/2 tablet (2.5 mg) by mouth for 1 week.  Then increase to 1/2 tablet by mouth twice a day. 90 tablet 3  . Multiple Vitamin (MULTIVITAMIN WITH MINERALS) TABS Take 1 tablet by mouth daily.    . nitroGLYCERIN (NITROSTAT) 0.4 MG SL tablet Place 1 tablet (0.4 mg total) under the tongue every 5 (five) minutes as needed for chest pain. 25 tablet 4  . sildenafil (VIAGRA) 100 MG tablet TAKE 1 TABLET BY MOUTH EVERY DAY AS NEEDED 6 tablet 2  . warfarin (COUMADIN) 5 MG tablet TAKE AS DIRECTED BY  COUMADIN  CLINIC 40 tablet 3  . spironolactone (ALDACTONE) 25 MG tablet Take 1 tablet (25 mg total) by mouth daily. 90 tablet 3   No current facility-administered medications for this visit.      Past Medical History:  Diagnosis Date  . Hyperlipidemia   . Hypertension 2015    ROS:   All systems reviewed and negative except as noted in the HPI.        Past Surgical History:  Procedure Laterality Date  . COLONOSCOPY  2013   "normal" per patient, [place]  . LEFT HEART CATH AND CORONARY ANGIOGRAPHY N/A 05/06/2018    Procedure: LEFT HEART CATH AND CORONARY ANGIOGRAPHY;  Surgeon: [name], MD;  Location: [facility];  Service: Cardiovascular;  Laterality: N/A;          Family History  Problem Relation Age of Onset  . Stroke Mother   . Heart disease Father   . Hypertension Brother   . Stroke Brother   . Hypertension Brother   . Cancer Neg Hx      Social History        Socioeconomic History  . Marital status: Married    Spouse name: Not on file  . Number of children: Not on file  . Years of education: Not on file  . Highest education level: Not on file  Occupational History  . Not on file  Social Needs  . Financial resource strain: Not on file  . Food insecurity    Worry: Not on file    Inability: Not on file  . Transportation needs    Medical: Not on file    Non-medical: Not on file  Tobacco Use  . Smoking status: Former Research scientist (life sciences)  . Smokeless tobacco: Never Used  . Tobacco comment: quit smoking 58 years old ago  Substance and Sexual Activity  . Alcohol use: Yes  Alcohol/week: 3.0 standard drinks    Types: 3 Cans of beer per week    Comment: on weekend   . Drug use: Not on file  . Sexual activity: Not on file  Lifestyle  . Physical activity    Days per week: Not on file    Minutes per session: Not on file  . Stress: Not on file  Relationships  . Social Herbalist on phone: Not on file    Gets together: Not on file    Attends religious service: Not on file    Active member of club or organization: Not on file    Attends meetings of clubs or organizations: Not on file    Relationship status: Not on file  . Intimate partner violence    Fear of current or ex partner: Not on file    Emotionally abused: Not on file    Physically abused: Not on file    Forced sexual activity: Not on file  Other Topics Concern  . Not on file  Social History Narrative   Married, 10 children, 4 grandchildren.    Surveyor, mining at [employer].   Exercise with walking on treadmill few days per week.   Member of [employer].    06/2017     BP 106/68   Pulse 74   Ht 5\' 8"  (1.727 m)   Wt 196 lb 12.8 oz (89.3 kg)   SpO2 98%   BMI 29.92 kg/m   Physical Exam:  Well appearing NAD HEENT: Unremarkable Neck:  No JVD, no thyromegally Lymphatics:  No adenopathy Back:  No CVA tenderness Lungs:  Clear with no wheezes HEART:  Regular rate rhythm, no murmurs, no rubs, no clicks Abd:  soft, positive bowel sounds, no organomegally, no rebound, no guarding Ext:  2 plus pulses, no edema, no cyanosis, no clubbing Skin:  No rashes no nodules Neuro:  CN II through XII intact, motor grossly intact  EKG nsr with anterior MI   Assess/Plan: 1. Chronic systolic heart failure - he has had class 2 symptoms. I have recommended ICD insertion as he has been on maximal medical therapy and his EF is still 25%. The risks/benefits/goals/expectations of ICD insertion were reviewed and he wishes to proceed. 2. CAD - he denies anginal symptoms. We will follow. 3. Dyslipidemia - he remains on statin therapy.  Ponciano Ort. EP Attending  Patient seen and examined. Agree with the findings as noted above. Since his last visit, no change.  Carra Brindley,M.D.

## 2019-02-07 NOTE — Discharge Instructions (Signed)
TOMORROW, 02/08/2019, PLEASE SEND A REMOTE DEVICE TRANSMISSION        Supplemental Discharge Instructions for  Pacemaker/Defibrillator Patients  Activity No heavy lifting or vigorous activity with your left/right arm for 6 to 8 weeks.  Do not raise your left/right arm above your head for one week.  Gradually raise your affected arm as drawn below.             02/11/2019               02/12/2019              02/13/2019            02/14/2019 __  NO DRIVING for  1 week   ; you may begin driving on   [id number]  .  WOUND CARE - Keep the wound area clean and dry.  Do not get this area wet for one week. No showers for one week; you may shower on  02/14/2019   . - Remover the arm sling tomorrow, 02/08/2019 - Remove the outer plastic bandage tomorrow, 02/07/2019.  The steri-strips (paper tapes) underneath STAY IN PLACE - The tape/steri-strips on your wound will fall off; do not pull them off.  No bandage is needed on the site.  DO  NOT apply any creams, oils, or ointments to the wound area. - If you notice any drainage or discharge from the wound, any swelling or bruising at the site, or you develop a fever > 101? F after you are discharged home, call the office at once.  Special Instructions - You are still able to use cellular telephones; use the ear opposite the side where you have your pacemaker/defibrillator.  Avoid carrying your cellular phone near your device. - When traveling through airports, show security personnel your identification card to avoid being screened in the metal detectors.  Ask the security personnel to use the hand wand. - Avoid arc welding equipment, MRI testing (magnetic resonance imaging), TENS units (transcutaneous nerve stimulators).  Call the office for questions about other devices. - Avoid electrical appliances that are in poor condition or are not properly grounded. - Microwave ovens are safe to be near or to operate.  Additional information for  defibrillator patients should your device go off: - If your device goes off ONCE and you feel fine afterward, notify the device clinic nurses. - If your device goes off ONCE and you do not feel well afterward, call 911. - If your device goes off TWICE, call 911. - If your device goes off THREE times in one day, call 911.  DO NOT DRIVE YOURSELF OR A FAMILY MEMBER WITH A DEFIBRILLATOR TO THE HOSPITAL--CALL 911.    Cardioverter Defibrillator Implantation, Care After This sheet gives you information about how to care for yourself after your procedure. Your health care provider may also give you more specific instructions. If you have problems or questions, contact your health care provider. What can I expect after the procedure? After the procedure, it is common to have:  Some pain. It may last a few days.  A slight bump over the skin where the device was placed. Sometimes, it is possible to feel the device under the skin. This is normal.  During the months and years after your procedure, your health care provider will check the device, the leads, and the battery every few months. Eventually, when the battery is low, the device will be replaced.  You should receive your defibrillator ID card for your  new device in the next 4-8 weeks.  Follow these instructions at home: Medicines  Take over-the-counter and prescription medicines only as told by your health care provider.  If you were prescribed an antibiotic medicine, take it as told by your health care provider. Do not stop taking the antibiotic even if you start to feel better. Incision care        Follow instructions from your health care provider about how to take care of your incision area. Make sure you: ? Leave stitches (sutures), skin glue, or adhesive strips in place. These skin closures may need to stay in place for 2 weeks or longer. If adhesive strip edges start to loosen and curl up, you may trim the loose edges. Do not  remove adhesive strips completely unless your health care provider tells you to do that.  Check your incision area every day for signs of infection. Check for: ? More redness, swelling, or pain. ? More fluid or blood. ? Warmth. ? Pus or a bad smell.  Do not use lotions or ointments near the incision area unless told by your health care provider.  Keep the incision area clean and dry for 7 days after the procedure or for as long as told by your health care provider. It takes several weeks for the incision site to heal completely.  Do not take baths, swim, or use a hot tub until your health care provider approves. Activity  Try to walk a little every day. Exercising is important after this procedure. Also, use your shoulder on the side of the defibrillator in daily tasks that do not require a lot of motion.  For at least 1 week: ? Do not lift your upper arm above your shoulders. This means no tennis, golf, or swimming for this period of time. If you tend to sleep with your arm above your head, use a restraint to prevent this during sleep.  For at least 6 weeks: ? Avoid sudden jerking, pulling, or chopping movements that pull your upper arm far away from your body.  Ask your health care provider when you may go back to work.  Check with your health care provider before you start to drive or play sports. Electric and magnetic fields  Tell all health care providers that you have a defibrillator. This may prevent them from giving you an MRI scan because strong magnets are used for that test.  If you must pass through a metal detector, quickly walk through it. Do not stop under the detector, and do not stand near it.  Avoid places or objects that have a strong electric or magnetic field, including: ? Airport Herbalist. At the airport, let officials know that you have a defibrillator. Your defibrillator ID card will let you be checked in a way that is safe for you and will not damage  your defibrillator. Also, do not let a security person wave a magnetic wand near your defibrillator. That can make it stop working. ? Power plants. ? Large electrical generators. ? Anti-theft systems or electronic article surveillance (EAS). ? Radiofrequency transmission towers, such as cell phone and radio towers.  Do not use amateur (ham) radio equipment or electric (arc) welding torches. Some devices are safe to use if held at least 12 inches (30 cm) from your defibrillator. These include power tools, lawn mowers, and speakers. If you are unsure if something is safe to use, ask your health care provider.  Do not use MP3 player headphones. They  have magnets.  You may safely use electric blankets, heating pads, computers, and microwave ovens.  When using your cell phone, hold it to the ear that is on the opposite side from the defibrillator. Do not leave your cell phone in a pocket over the defibrillator. General instructions  Follow diet instructions from your health care provider, if this applies.  Always keep your defibrillator ID card with you. The card should list the implant date, device model, and manufacturer. Consider wearing a medical alert bracelet or necklace.  Have your defibrillator checked every 3-6 months or as often as told by your health care provider. Most defibrillators last for 4-8 years.  Keep all follow-up visits as told by your health care provider. This is important for your health care provider to make sure your chest is healing the way it should. Ask your health care provider when you should come back to have your stitches or staples taken out. Contact a health care provider if:  You gain weight suddenly.  Your legs or feet swell more than they have before.  It feels like your heart is fluttering or skipping beats (heart palpitations).  You have more redness, swelling, or pain around your incision.  You have more fluid or blood coming from your  incision.  Your incision feels warm to the touch.  You have pus or a bad smell coming from your incision.  You have a fever. Get help right away if:  You have chest pain.  You feel more than one shock.  You feel more short of breath than you have felt before.  You feel more light-headed than you have felt before.  Your incision starts to open up. This information is not intended to replace advice given to you by your health care provider. Make sure you discuss any questions you have with your health care provider.

## 2019-02-07 NOTE — Progress Notes (Signed)
4 profend swabs used

## 2019-02-08 MED FILL — Gentamicin Sulfate Inj 40 MG/ML: INTRAMUSCULAR | Qty: 80 | Status: AC

## 2019-02-08 NOTE — Telephone Encounter (Signed)
I spoke with pt. He reports recent FMLA paperwork completed by Dr Radford Pax indicated return to work date of 12/5. Pt states he did not return to work because he had ICD placed on 12/10. He needs date corrected. I told pt our office would look into this.  He does not have new paperwork but is going to check with his employer about this.  He is also asking about changing to a different cardiologist within practice. I asked him to let us know who he would like to see and a message could be sent to Dr Radford Pax about change.  Paperwork pt is referencing is scanned under media tab-dated 12/9.

## 2019-02-11 ENCOUNTER — Encounter (HOSPITAL_COMMUNITY)
Admission: RE | Admit: 2019-02-11 | Discharge: 2019-02-11 | Disposition: A | Discharge status: Home / Self Care | Payer: Self-pay | Source: Ambulatory Visit | Attending: Cardiology | Admitting: Cardiology

## 2019-02-12 ENCOUNTER — Other Ambulatory Visit: Payer: Self-pay

## 2019-02-12 ENCOUNTER — Ambulatory Visit (INDEPENDENT_AMBULATORY_CARE_PROVIDER_SITE_OTHER): Payer: Self-pay | Admitting: *Deleted

## 2019-02-12 DIAGNOSIS — I2102 ST elevation (STEMI) myocardial infarction involving left anterior descending coronary artery: Secondary | ICD-10-CM

## 2019-02-12 DIAGNOSIS — Z5181 Encounter for therapeutic drug level monitoring: Secondary | ICD-10-CM

## 2019-02-12 LAB — POCT INR: INR: 1.2 — AB (ref 2.0–3.0)

## 2019-02-12 NOTE — Patient Instructions (Addendum)
Description   Today and tomorrow take 1.5 tablets then continue on same dosage 1 tablet daily. Recheck INR in 1 week with wound check. Coumadin Clinic [phone] Main [phone]

## 2019-02-19 ENCOUNTER — Ambulatory Visit (INDEPENDENT_AMBULATORY_CARE_PROVIDER_SITE_OTHER): Payer: BC Managed Care – PPO | Admitting: Student

## 2019-02-19 ENCOUNTER — Other Ambulatory Visit: Payer: Self-pay

## 2019-02-19 ENCOUNTER — Ambulatory Visit (INDEPENDENT_AMBULATORY_CARE_PROVIDER_SITE_OTHER): Payer: BC Managed Care – PPO | Admitting: *Deleted

## 2019-02-19 DIAGNOSIS — I472 Ventricular tachycardia: Secondary | ICD-10-CM

## 2019-02-19 DIAGNOSIS — I4729 Other ventricular tachycardia: Secondary | ICD-10-CM

## 2019-02-19 DIAGNOSIS — Z5181 Encounter for therapeutic drug level monitoring: Secondary | ICD-10-CM | POA: Diagnosis not present

## 2019-02-19 DIAGNOSIS — I5022 Chronic systolic (congestive) heart failure: Secondary | ICD-10-CM

## 2019-02-19 DIAGNOSIS — I2102 ST elevation (STEMI) myocardial infarction involving left anterior descending coronary artery: Secondary | ICD-10-CM

## 2019-02-19 DIAGNOSIS — Z95810 Presence of automatic (implantable) cardiac defibrillator: Secondary | ICD-10-CM | POA: Diagnosis not present

## 2019-02-19 LAB — CUP PACEART INCLINIC DEVICE CHECK
Battery Voltage: 3.11 V
Brady Statistic RV Percent Paced: 0 %
Date Time Interrogation Session: 20201222141906
HighPow Impedance: 72 Ohm
Implantable Lead Implant Date: 20201210
Implantable Lead Location: 753860
Implantable Lead Model: 436909
Implantable Lead Serial Number: 8102423
Implantable Pulse Generator Implant Date: 20201210
Lead Channel Impedance Value: 500 Ohm
Lead Channel Pacing Threshold Amplitude: 0.5 V
Lead Channel Pacing Threshold Pulse Width: 0.4 ms
Lead Channel Sensing Intrinsic Amplitude: 20.5 mV
Lead Channel Sensing Intrinsic Amplitude: 3 mV
Lead Channel Setting Pacing Amplitude: 3 V
Lead Channel Setting Pacing Pulse Width: 0.4 ms
Lead Channel Setting Sensing Sensitivity: 0.8 mV
Pulse Gen Model: 429525
Pulse Gen Serial Number: 84745362

## 2019-02-19 LAB — POCT INR: INR: 2.4 (ref 2.0–3.0)

## 2019-02-19 MED ORDER — CARVEDILOL 12.5 MG PO TABS: 12.5000 mg | ORAL_TABLET | Freq: Two times a day (BID) | ORAL | 3 refills | Status: DC

## 2019-02-19 NOTE — Patient Instructions (Signed)
Description   Continue on same dosage 1 tablet daily. Recheck INR in 3 weeks. [facility] Clinic [phone] Main [phone]      

## 2019-02-19 NOTE — Progress Notes (Signed)
Wound check appointment. Steri-strips removed. Wound without redness or edema. Incision edges approximated, wound well healed. Normal device function. 1 episode of NSVT; Coreg increased today.  Thresholds, sensing, and impedances consistent with implant measurements. Device programmed at 3.5V for extra safety margin until 3 month visit. Histogram distribution appropriate for patient and level of activity. Patient educated about wound care, arm mobility, lifting restrictions, shock plan. ROV in 3 months with [name].

## 2019-03-14 ENCOUNTER — Other Ambulatory Visit: Payer: Self-pay

## 2019-03-14 ENCOUNTER — Ambulatory Visit (INDEPENDENT_AMBULATORY_CARE_PROVIDER_SITE_OTHER): Payer: Self-pay | Admitting: *Deleted

## 2019-03-14 ENCOUNTER — Encounter: Payer: 59 | Admitting: Internal Medicine

## 2019-03-14 DIAGNOSIS — Z5181 Encounter for therapeutic drug level monitoring: Secondary | ICD-10-CM

## 2019-03-14 DIAGNOSIS — I2102 ST elevation (STEMI) myocardial infarction involving left anterior descending coronary artery: Secondary | ICD-10-CM

## 2019-03-14 LAB — POCT INR: INR: 3 (ref 2.0–3.0)

## 2019-03-14 NOTE — Patient Instructions (Signed)
Description   °Continue on same dosage 1 tablet daily. Recheck INR in 4 weeks. [facility] Clinic [phone] Main # [phone] °  ° ° °

## 2019-03-27 ENCOUNTER — Other Ambulatory Visit: Payer: Self-pay

## 2019-03-27 ENCOUNTER — Telehealth (HOSPITAL_COMMUNITY): Payer: Self-pay | Admitting: *Deleted

## 2019-03-27 ENCOUNTER — Encounter (HOSPITAL_COMMUNITY)
Admission: RE | Admit: 2019-03-27 | Discharge: 2019-03-27 | Disposition: A | Discharge status: Home / Self Care | Payer: Self-pay | Source: Ambulatory Visit | Attending: Cardiology | Admitting: Cardiology

## 2019-03-27 NOTE — Telephone Encounter (Signed)
Spoke to Caleb Thomas regarding the virtual cardiac rehab better hearts APP. Caleb Plona is exercising and documenting in the virtual APP 3-7 times a week. Patient has no exercise concerns or questions. Will continue to follow the patient on the better heart APP.Barnet Pall, RN,BSN 03/27/2019 4:25 PM

## 2019-03-28 ENCOUNTER — Other Ambulatory Visit: Payer: Self-pay | Admitting: Cardiology

## 2019-04-12 ENCOUNTER — Ambulatory Visit (INDEPENDENT_AMBULATORY_CARE_PROVIDER_SITE_OTHER): Payer: Self-pay | Admitting: *Deleted

## 2019-04-12 ENCOUNTER — Other Ambulatory Visit: Payer: Self-pay

## 2019-04-12 DIAGNOSIS — I2102 ST elevation (STEMI) myocardial infarction involving left anterior descending coronary artery: Secondary | ICD-10-CM

## 2019-04-12 DIAGNOSIS — Z5181 Encounter for therapeutic drug level monitoring: Secondary | ICD-10-CM

## 2019-04-12 LAB — POCT INR: INR: 3.2 — AB (ref 2.0–3.0)

## 2019-04-12 NOTE — Patient Instructions (Signed)
Description   Today take 1/2 tablet then continue on same dosage 1 tablet daily. Recheck INR in 4 weeks. [facility]in Clinic [phone] Main [phone]

## 2019-04-29 ENCOUNTER — Telehealth: Payer: Self-pay | Admitting: Internal Medicine

## 2019-04-29 NOTE — Telephone Encounter (Signed)
After receiving pictures via mychart spoke with pt.  The itching he is experiencing is not at incision site it is around the outer edges of implanted device.  When he touches to itch there is tenderness.  Scheduled pt for in clinic wound check appt on 3/4 when [name] will also be in office.  Advised pt if condition changes in any way before visit to call back.

## 2019-04-29 NOTE — Telephone Encounter (Signed)
Spoke with pt.  He denies any redness or rash at site.  He just says it "looks different"  Requested he send a picture via mychart.  In the meantime if no broken skin or rash can use skin lotion for itch.  Ok to sleep on left side as tolerated.  Pt has scheduled OV on 3/16

## 2019-04-29 NOTE — Telephone Encounter (Signed)
  1. Has your device fired? No  2. Is you device beeping? No  3. Are you experiencing draining or swelling at device site? No   4. Are you calling to see if we received your device transmission? No  5. Have you passed out? No  Caleb Thomas is calling stating his defibrillator is sore and itching at the device site. He states it's not necessarily swelling, but it looks different than it did before. Please advise.     Please route to [place]

## 2019-05-02 ENCOUNTER — Ambulatory Visit (INDEPENDENT_AMBULATORY_CARE_PROVIDER_SITE_OTHER): Payer: Self-pay | Admitting: Student

## 2019-05-02 ENCOUNTER — Other Ambulatory Visit: Payer: Self-pay

## 2019-05-02 DIAGNOSIS — I472 Ventricular tachycardia: Secondary | ICD-10-CM

## 2019-05-02 DIAGNOSIS — I4729 Other ventricular tachycardia: Secondary | ICD-10-CM

## 2019-05-02 DIAGNOSIS — I5022 Chronic systolic (congestive) heart failure: Secondary | ICD-10-CM

## 2019-05-02 LAB — CUP PACEART INCLINIC DEVICE CHECK
Battery Voltage: 3.11 V
Brady Statistic RV Percent Paced: 0 %
Date Time Interrogation Session: 20210304152609
HighPow Impedance: 79 Ohm
Implantable Lead Implant Date: 20201210
Implantable Lead Location: 753860
Implantable Lead Model: 436909
Implantable Lead Serial Number: 8102423
Implantable Pulse Generator Implant Date: 20201210
Lead Channel Impedance Value: 520 Ohm
Lead Channel Pacing Threshold Amplitude: 0.5 V
Lead Channel Pacing Threshold Pulse Width: 0.4 ms
Lead Channel Sensing Intrinsic Amplitude: 2.9 mV
Lead Channel Sensing Intrinsic Amplitude: 23.5 mV
Lead Channel Setting Pacing Amplitude: 3 V
Lead Channel Setting Pacing Pulse Width: 0.4 ms
Lead Channel Setting Sensing Sensitivity: 0.8 mV
Pulse Gen Model: 429525
Pulse Gen Serial Number: 84745362

## 2019-05-02 NOTE — Progress Notes (Signed)
Add on site check in clinic for itching and tenderness. Site appears stable. Small keloid noted over incision. Very slightly tender to palpation in upper outer quadrant. No gross inflammatory changes noted. ICD check in clinic. Normal device function. Thresholds and sensing consistent with previous device measurements. Impedance trends stable over time. Occasional NSVT. Histogram distribution appropriate for patient and level of activity. No changes made this session. Device programmed at appropriate safety margins. Battery at beginning of service. Pt enrolled in remote follow-up.

## 2019-05-10 ENCOUNTER — Ambulatory Visit (INDEPENDENT_AMBULATORY_CARE_PROVIDER_SITE_OTHER): Payer: Self-pay | Admitting: *Deleted

## 2019-05-10 ENCOUNTER — Telehealth (HOSPITAL_COMMUNITY): Payer: Self-pay

## 2019-05-10 ENCOUNTER — Other Ambulatory Visit: Payer: Self-pay

## 2019-05-10 ENCOUNTER — Encounter (HOSPITAL_COMMUNITY)
Admission: RE | Admit: 2019-05-10 | Discharge: 2019-05-10 | Disposition: A | Discharge status: Home / Self Care | Payer: Self-pay | Source: Ambulatory Visit | Attending: Cardiology | Admitting: Cardiology

## 2019-05-10 DIAGNOSIS — Z95810 Presence of automatic (implantable) cardiac defibrillator: Secondary | ICD-10-CM

## 2019-05-10 LAB — CUP PACEART REMOTE DEVICE CHECK
Date Time Interrogation Session: 20210312071551
Implantable Lead Implant Date: 20201210
Implantable Lead Location: 753860
Implantable Lead Model: 436909
Implantable Lead Serial Number: 8102423
Implantable Pulse Generator Implant Date: 20201210
Pulse Gen Model: 429525
Pulse Gen Serial Number: 84745362

## 2019-05-10 NOTE — Progress Notes (Signed)
ICD Remote  

## 2019-05-10 NOTE — Progress Notes (Signed)
Virtual Cardiac Rehab Note:  Successful telephone encounter with Caleb Thomas as a follow up for c/o lower extremity swelling. [name] states his legs occasionally swell. RN reviewed sodium intake history with patient. [name] states he ate 2 hotdogs earlier yesterday and noticed the swelling later in the day. Discuss effects of excessive sodium intake on fluid retention. Patient also provided verbal review of CHF action plan and when to notify cardiology. Discussed food label reading and foods that are considered to be moderate to high sodium. Patient is encouraged to limit his sodium intake to <2000 mg/day unless otherwise instructed by cardiologist. Patient's recorded weight in the [facility] He[facility] app stays relatively stable however patient does admit his baseline is 194. Today reports 197 which is what his daily weight has been for the last few weeks. Patient is encouraged to contact cardiologist to report 3 lb weight gain. [name] also complains of fatigue with moderate exertional work around his home. States he was putting lumbar into the truck yesterday and "just had to sit down". "I couldn't do it". Denies shortness of breath or abdominal distension. Patient has appointment with [name] 05/14/19.  [name] is scheduled for graduation from the [facility] He[facility] VCR app 05/29/19. States he really enjoys documenting vitals and exercise. RN will send patient app options via VCR chat that patient can continue to utilize post discharge.  Plan: will continue to follow patient closely  Kareem Aul E. Rollene Rotunda RN, BSN [place]. [facility]  Cardiac and Pulmonary Rehabilitation Phone: [phone] Fax: [phone]

## 2019-05-10 NOTE — Telephone Encounter (Signed)
Virtual Cardiac Rehab Note:  Unsuccessful telephone encounter to Binnie Rail to follow up on logged symptoms with exercise on the [facility] VCR app. Patient states he had lower extremity swelling that resolved with elevation. HIPAA compliant VM message left requesting call back to [phone].  Kashmir Leedy E. Rollene Rotunda RN, BSN [place]. [facility]  Cardiac and Pulmonary Rehabilitation Phone: [phone] Fax: [phone]

## 2019-05-14 ENCOUNTER — Ambulatory Visit (INDEPENDENT_AMBULATORY_CARE_PROVIDER_SITE_OTHER): Payer: PRIVATE HEALTH INSURANCE | Admitting: *Deleted

## 2019-05-14 ENCOUNTER — Ambulatory Visit (INDEPENDENT_AMBULATORY_CARE_PROVIDER_SITE_OTHER): Payer: PRIVATE HEALTH INSURANCE | Admitting: Internal Medicine

## 2019-05-14 ENCOUNTER — Other Ambulatory Visit: Payer: Self-pay

## 2019-05-14 ENCOUNTER — Encounter: Payer: Self-pay | Admitting: Internal Medicine

## 2019-05-14 VITALS — BP 130/84 | HR 69 | Ht 68.0 in | Wt 205.8 lb

## 2019-05-14 DIAGNOSIS — I5022 Chronic systolic (congestive) heart failure: Secondary | ICD-10-CM | POA: Diagnosis not present

## 2019-05-14 DIAGNOSIS — Z95810 Presence of automatic (implantable) cardiac defibrillator: Secondary | ICD-10-CM | POA: Diagnosis not present

## 2019-05-14 DIAGNOSIS — Z5181 Encounter for therapeutic drug level monitoring: Secondary | ICD-10-CM

## 2019-05-14 DIAGNOSIS — I2102 ST elevation (STEMI) myocardial infarction involving left anterior descending coronary artery: Secondary | ICD-10-CM | POA: Diagnosis not present

## 2019-05-14 LAB — CUP PACEART INCLINIC DEVICE CHECK
Date Time Interrogation Session: 20210316135737
Implantable Lead Implant Date: 20201210
Implantable Lead Location: 753860
Implantable Lead Model: 436909
Implantable Lead Serial Number: 8102423
Implantable Pulse Generator Implant Date: 20201210
Pulse Gen Model: 429525
Pulse Gen Serial Number: 84745362

## 2019-05-14 LAB — POCT INR: INR: 2.6 (ref 2.0–3.0)

## 2019-05-14 NOTE — Patient Instructions (Signed)
Medication Instructions:  Your physician recommends that you continue on your current medications as directed. Please refer to the Current Medication list given to you today.  Labwork: None ordered.  Testing/Procedures: None ordered.  Follow-Up: Your physician wants you to follow-up in: one year with [name].   You will receive a reminder letter in the mail two months in advance. If you don't receive a letter, please call our office to schedule the follow-up appointment.  Remote monitoring is used to monitor your ICD from home. This monitoring reduces the number of office visits required to check your device to one time per year. It allows [place] to keep an eye on the functioning of your device to ensure it is working properly. You are scheduled for a device check from home on 08/09/2019. You may send your transmission at any time that day. If you have a wireless device, the transmission will be sent automatically. After your physician reviews your transmission, you will receive a postcard with your next transmission date.  Any Other Special Instructions Will Be Listed Below (If Applicable).  If you need a refill on your cardiac medications before your next appointment, please call your pharmacy.

## 2019-05-14 NOTE — Patient Instructions (Signed)
Description   Continue taking 1 tablet daily. Recheck INR in 5 weeks. Coumadin Clinic [phone] Main [phone]

## 2019-05-14 NOTE — Progress Notes (Signed)
HPI [name] returns today for followup. He is a pleasant 59 yo man with a h/o CAD, s/p MI, chronic systolic heart failure, and dyslipidemia. He has class 2 CHF symptoms. He has not had syncope or ICD therapy. He c/o fatigue and weakness and dyspnea with exertion. He has occaisional peripheral edema.  No Known Allergies   Current Outpatient Medications  Medication Sig Dispense Refill  . aspirin 81 MG EC tablet Take 1 tablet (81 mg total) by mouth daily. 90 tablet 3  . atorvastatin (LIPITOR) 80 MG tablet Take 1 tablet (80 mg total) by mouth daily at 6 PM. 30 tablet 6  . carvedilol (COREG) 12.5 MG tablet Take 1 tablet (12.5 mg total) by mouth 2 (two) times daily. 180 tablet 3  . clopidogrel (PLAVIX) 75 MG tablet Take 1 tablet (75 mg total) by mouth daily with breakfast. 30 tablet 11  . lisinopril (ZESTRIL) 5 MG tablet Take 1/2 tablet (2.5 mg) by mouth for 1 week.  Then increase to 1/2 tablet by mouth twice a day. 90 tablet 3  . nitroGLYCERIN (NITROSTAT) 0.4 MG SL tablet Place 1 tablet (0.4 mg total) under the tongue every 5 (five) minutes as needed for chest pain. 25 tablet 4  . sildenafil (VIAGRA) 100 MG tablet TAKE 1 TABLET BY MOUTH EVERY DAY AS NEEDED 6 tablet 2  . spironolactone (ALDACTONE) 25 MG tablet Take 25 mg by mouth daily.    Marland Kitchen warfarin (COUMADIN) 5 MG tablet TAKE AS DIRECTED BY  COUMADIN  CLINIC 35 tablet 2   No current facility-administered medications for this visit.     Past Medical History:  Diagnosis Date  . Hyperlipidemia   . Hypertension 2015    ROS:   All systems reviewed and negative except as noted in the HPI.   Past Surgical History:  Procedure Laterality Date  . COLONOSCOPY  2013   "normal" per patient, [place]  . ICD IMPLANT N/A 02/07/2019   Procedure: ICD IMPLANT;  Surgeon: Evans Lance, MD;  Location: [facility];  Service: Cardiovascular;  Laterality: N/A;  . LEFT HEART CATH AND CORONARY ANGIOGRAPHY N/A 05/06/2018   Procedure: LEFT  HEART CATH AND CORONARY ANGIOGRAPHY;  Surgeon: [name], MD;  Location: [facility];  Service: Cardiovascular;  Laterality: N/A;     Family History  Problem Relation Age of Onset  . Stroke Mother   . Heart disease Father   . Hypertension Brother   . Stroke Brother   . Hypertension Brother   . Cancer Neg Hx      Social History   Socioeconomic History  . Marital status: Married    Spouse name: Not on file  . Number of children: Not on file  . Years of education: Not on file  . Highest education level: Not on file  Occupational History  . Not on file  Tobacco Use  . Smoking status: Former Research scientist (life sciences)  . Smokeless tobacco: Never Used  . Tobacco comment: quit smoking 59 years old ago  Substance and Sexual Activity  . Alcohol use: Yes    Alcohol/week: 3.0 standard drinks    Types: 3 Cans of beer per week    Comment: on weekend   . Drug use: Not on file  . Sexual activity: Not on file  Other Topics Concern  . Not on file  Social History Narrative   Married, 10 children, 4 grandchildren.   Surveyor, mining at [employer].   Exercise  with walking on treadmill few days per week.   Member of [employer].    06/2017   Social Determinants of Health   Financial Resource Strain:   . Difficulty of Paying Living Expenses:   Food Insecurity:   . Worried About Charity fundraiser in the Last Year:   . Arboriculturist in the Last Year:   Transportation Needs:   . Film/video editor (Medical):   Marland Kitchen Lack of Transportation (Non-Medical):   Physical Activity:   . Days of Exercise per Week:   . Minutes of Exercise per Session:   Stress:   . Feeling of Stress :   Social Connections:   . Frequency of Communication with Friends and Family:   . Frequency of Social Gatherings with Friends and Family:   . Attends Religious Services:   . Active Member of Clubs or Organizations:   . Attends Archivist Meetings:   Marland Kitchen Marital Status:   Intimate Partner Violence:     . Fear of Current or Ex-Partner:   . Emotionally Abused:   Marland Kitchen Physically Abused:   . Sexually Abused:      BP 130/84   Pulse 69   Ht 5\' 8"  (1.727 m)   Wt 205 lb 12.8 oz (93.4 kg)   SpO2 95%   BMI 31.29 kg/m   Physical Exam:  Well appearing NAD HEENT: Unremarkable Neck:  No JVD, no thyromegally Lymphatics:  No adenopathy Back:  No CVA tenderness Lungs:  Clear with no wheezes; well healed ICD incision. HEART:  Regular rate rhythm, no murmurs, no rubs, no clicks Abd:  soft, positive bowel sounds, no organomegally, no rebound, no guarding Ext:  2 plus pulses, no edema, no cyanosis, no clubbing Skin:  No rashes no nodules Neuro:  CN II through XII intact, motor grossly intact  EKG - nsr with old anterior MI  DEVICE  Normal device function.  See PaceArt for details.   Assess/Plan: 1. Chronic systolic heart failure - his symptoms are class 2. He is encouraged to continue his current meds. I asked him to increase his level of activity. 2. CAD - he is s/p MI. He has no anginal symptoms.  3. Dyslipidemia - he will continue his statin therapy.  4. ED - we discussed the use of viagra and what to expect. He has some limitations.  Caleb Thomas.D.

## 2019-05-15 MED ORDER — CLOPIDOGREL BISULFATE 75 MG PO TABS: 75.0000 mg | ORAL_TABLET | Freq: Every day | ORAL | 3 refills | Status: DC

## 2019-06-03 ENCOUNTER — Telehealth: Payer: Self-pay

## 2019-06-03 ENCOUNTER — Telehealth: Payer: Self-pay | Admitting: Internal Medicine

## 2019-06-03 NOTE — Telephone Encounter (Signed)
Biotronik alert received on 05/31/19 @ 10:01 AM for episode of NSVT, total of 15 beats. VT-1 zone and VF zone met. No therapies delivered. Called patient for assessment, LMOVM, Direct phone number provided along with office hours.  Patient needs assessment.

## 2019-06-03 NOTE — Telephone Encounter (Signed)
Biotronik alert received on 05/31/19 @ 10:01 AM for episode of NSVT, total of 15 beats. VT-1 zone and VF zone met. No therapies delivered.   Called patient for assessment. Patient denies any complaints during episode or at this time. Patient reports of being compliant with medications. Currently taking Coreg, Plavix and Coumadin per med list.  Routed to [name] for further recommendation.

## 2019-06-03 NOTE — Telephone Encounter (Signed)
Patient called stating someone from office called him, he stated no message was left. I did not see anything documented in his chart.

## 2019-06-04 ENCOUNTER — Other Ambulatory Visit: Payer: Self-pay

## 2019-06-04 ENCOUNTER — Encounter (HOSPITAL_COMMUNITY)
Admission: RE | Admit: 2019-06-04 | Discharge: 2019-06-04 | Disposition: A | Discharge status: Home / Self Care | Payer: Self-pay | Source: Ambulatory Visit | Attending: Cardiology | Admitting: Cardiology

## 2019-06-04 NOTE — Progress Notes (Signed)
Virtual Cardiac Rehab Note:  Successful telephone encounter to Caleb Thomas to follow up on exercise plan post discharge from [facility] VCR program. Caleb Thomas plans to continue to walk daily for 30 min. He plans to continue to manage his health by taking all medications as prescribed, exercise daily, follow a heart healthy diet, and attend all medical appointments. He also plans to continue to monitor his vitals at home and record. Today he is provided a free alternative to the [facility] app as he will not longer be able to access upon discharge. He is encouraged to consider downloading and utilizing the free app ONE DROP to record physical activity and vitals. Caleb Thomas is appreciative for the opportunity to participate in VCR. He is provided RN CM department contact if additional needs or concerns arise. It was a pleasure working with Caleb Thomas.  Plan: patient will be discharged from [place]. Rollene Rotunda RN, BSN [place]. [facility]  Cardiac and Pulmonary Rehabilitation Phone: [phone] Fax: [phone]

## 2019-06-07 NOTE — Telephone Encounter (Signed)
I do not need to be notified for NSVT

## 2019-06-15 ENCOUNTER — Other Ambulatory Visit: Payer: Self-pay | Admitting: Physician Assistant

## 2019-06-18 ENCOUNTER — Ambulatory Visit (INDEPENDENT_AMBULATORY_CARE_PROVIDER_SITE_OTHER): Payer: PRIVATE HEALTH INSURANCE | Admitting: *Deleted

## 2019-06-18 ENCOUNTER — Other Ambulatory Visit: Payer: Self-pay

## 2019-06-18 DIAGNOSIS — I2102 ST elevation (STEMI) myocardial infarction involving left anterior descending coronary artery: Secondary | ICD-10-CM | POA: Diagnosis not present

## 2019-06-18 DIAGNOSIS — Z5181 Encounter for therapeutic drug level monitoring: Secondary | ICD-10-CM

## 2019-06-18 LAB — POCT INR: INR: 3.2 — AB (ref 2.0–3.0)

## 2019-06-18 NOTE — Patient Instructions (Signed)
Description   Hold today and then continue taking 1 tablet daily. Recheck INR in 4 weeks. Coumadin Clinic [phone] Main [phone]

## 2019-07-04 ENCOUNTER — Telehealth: Payer: Self-pay | Admitting: Internal Medicine

## 2019-07-04 NOTE — Telephone Encounter (Signed)
   Pt said he's been having leg cramps on and off all day. He wasn't sure if it's from his heart meds but he wants to speak with Dr. Tanna Furry nurse about it

## 2019-07-05 NOTE — Telephone Encounter (Signed)
Pt called to report that he had "leg cramping" all day yesterday. He says he had it for a short time last week but it subsided.   Pt says it was his the back of his upper legs on both sides and some in his calves but not much.   He denies any recent change in physical activity or any recent illness that may have depleted his electrolytes.. he says that he has been eating and drinking well.   Pt says the cramping is gone.. he slept well last night without any difficulties.   I advised pt to continue to monitor. He will call his PCP. I will also forward to [name] and call him back if he has any recommendations.

## 2019-07-06 NOTE — Telephone Encounter (Signed)
I agree. He will need to reach out to PCP. Needs labs drawn. GT

## 2019-07-14 ENCOUNTER — Other Ambulatory Visit: Payer: Self-pay | Admitting: Cardiology

## 2019-07-14 ENCOUNTER — Other Ambulatory Visit: Payer: Self-pay | Admitting: Internal Medicine

## 2019-07-16 ENCOUNTER — Ambulatory Visit (INDEPENDENT_AMBULATORY_CARE_PROVIDER_SITE_OTHER): Payer: PRIVATE HEALTH INSURANCE | Admitting: *Deleted

## 2019-07-16 ENCOUNTER — Other Ambulatory Visit: Payer: Self-pay

## 2019-07-16 DIAGNOSIS — I2102 ST elevation (STEMI) myocardial infarction involving left anterior descending coronary artery: Secondary | ICD-10-CM | POA: Diagnosis not present

## 2019-07-16 DIAGNOSIS — Z5181 Encounter for therapeutic drug level monitoring: Secondary | ICD-10-CM | POA: Diagnosis not present

## 2019-07-16 LAB — POCT INR: INR: 3.2 — AB (ref 2.0–3.0)

## 2019-07-16 NOTE — Patient Instructions (Signed)
Description   Hold your Warfarin today and then start taking 1 tablet daily except 1/2 tablet on Sundays. Recheck INR in 4 weeks. Coumadin Clinic [phone] Main [phone]

## 2019-08-09 ENCOUNTER — Ambulatory Visit (INDEPENDENT_AMBULATORY_CARE_PROVIDER_SITE_OTHER): Payer: PRIVATE HEALTH INSURANCE | Admitting: *Deleted

## 2019-08-09 DIAGNOSIS — I5022 Chronic systolic (congestive) heart failure: Secondary | ICD-10-CM

## 2019-08-09 LAB — CUP PACEART REMOTE DEVICE CHECK
Date Time Interrogation Session: 20210611055526
Implantable Lead Implant Date: 20201210
Implantable Lead Location: 753860
Implantable Lead Model: 436909
Implantable Lead Serial Number: 8102423
Implantable Pulse Generator Implant Date: 20201210
Pulse Gen Model: 429525
Pulse Gen Serial Number: 84745362

## 2019-08-09 NOTE — Progress Notes (Signed)
Remote ICD transmission.   

## 2019-08-13 ENCOUNTER — Other Ambulatory Visit: Payer: Self-pay

## 2019-08-13 ENCOUNTER — Ambulatory Visit (INDEPENDENT_AMBULATORY_CARE_PROVIDER_SITE_OTHER): Payer: PRIVATE HEALTH INSURANCE | Admitting: *Deleted

## 2019-08-13 DIAGNOSIS — I2102 ST elevation (STEMI) myocardial infarction involving left anterior descending coronary artery: Secondary | ICD-10-CM

## 2019-08-13 DIAGNOSIS — Z5181 Encounter for therapeutic drug level monitoring: Secondary | ICD-10-CM

## 2019-08-13 LAB — POCT INR: INR: 3.1 — AB (ref 2.0–3.0)

## 2019-08-13 NOTE — Patient Instructions (Signed)
Description   Today take 1/2 tablet then continue taking 1 tablet daily except 1/2 tablet on Sundays. Recheck INR in 4 weeks. Coumadin Clinic [phone] Main [phone]

## 2019-09-10 ENCOUNTER — Ambulatory Visit (INDEPENDENT_AMBULATORY_CARE_PROVIDER_SITE_OTHER): Payer: PRIVATE HEALTH INSURANCE | Admitting: Pharmacist

## 2019-09-10 ENCOUNTER — Other Ambulatory Visit: Payer: Self-pay

## 2019-09-10 DIAGNOSIS — I2102 ST elevation (STEMI) myocardial infarction involving left anterior descending coronary artery: Secondary | ICD-10-CM | POA: Diagnosis not present

## 2019-09-10 DIAGNOSIS — Z5181 Encounter for therapeutic drug level monitoring: Secondary | ICD-10-CM

## 2019-09-10 LAB — POCT INR: INR: 3.1 — AB (ref 2.0–3.0)

## 2019-09-10 NOTE — Patient Instructions (Signed)
Today take 1/2 tablet then continue taking 1 tablet daily except 1/2 tablet on Sundays. Recheck INR in 4 weeks. Coumadin Clinic [phone] Main [phone]

## 2019-10-08 ENCOUNTER — Other Ambulatory Visit: Payer: Self-pay

## 2019-10-08 ENCOUNTER — Ambulatory Visit (INDEPENDENT_AMBULATORY_CARE_PROVIDER_SITE_OTHER): Payer: PRIVATE HEALTH INSURANCE | Admitting: *Deleted

## 2019-10-08 DIAGNOSIS — I2102 ST elevation (STEMI) myocardial infarction involving left anterior descending coronary artery: Secondary | ICD-10-CM | POA: Diagnosis not present

## 2019-10-08 DIAGNOSIS — Z5181 Encounter for therapeutic drug level monitoring: Secondary | ICD-10-CM | POA: Diagnosis not present

## 2019-10-08 LAB — POCT INR: INR: 3.6 — AB (ref 2.0–3.0)

## 2019-10-08 NOTE — Patient Instructions (Signed)
Description   Do not take any Warfarin today then start taking 1 tablet daily except 1/2 tablet on Sundays and Thursdays. Recheck INR in 3 weeks. Coumadin Clinic [phone] Main [phone]

## 2019-10-29 ENCOUNTER — Ambulatory Visit (INDEPENDENT_AMBULATORY_CARE_PROVIDER_SITE_OTHER): Payer: PRIVATE HEALTH INSURANCE

## 2019-10-29 ENCOUNTER — Other Ambulatory Visit: Payer: Self-pay

## 2019-10-29 DIAGNOSIS — Z5181 Encounter for therapeutic drug level monitoring: Secondary | ICD-10-CM

## 2019-10-29 DIAGNOSIS — I2102 ST elevation (STEMI) myocardial infarction involving left anterior descending coronary artery: Secondary | ICD-10-CM

## 2019-10-29 LAB — POCT INR: INR: 2.8 (ref 2.0–3.0)

## 2019-10-29 NOTE — Patient Instructions (Signed)
Description   Continue on same dosage 1 tablet daily except 1/2 tablet on Sundays and Thursdays. Recheck INR in 4 weeks. Coumadin Clinic [phone] Main [phone]

## 2019-11-03 ENCOUNTER — Other Ambulatory Visit: Payer: Self-pay | Admitting: Cardiology

## 2019-11-08 ENCOUNTER — Ambulatory Visit (INDEPENDENT_AMBULATORY_CARE_PROVIDER_SITE_OTHER): Payer: Self-pay | Admitting: *Deleted

## 2019-11-08 DIAGNOSIS — Z95810 Presence of automatic (implantable) cardiac defibrillator: Secondary | ICD-10-CM

## 2019-11-08 LAB — CUP PACEART REMOTE DEVICE CHECK
Date Time Interrogation Session: 20210910090253
Implantable Lead Implant Date: 20201210
Implantable Lead Location: 753860
Implantable Lead Model: 436909
Implantable Lead Serial Number: 8102423
Implantable Pulse Generator Implant Date: 20201210
Pulse Gen Model: 429525
Pulse Gen Serial Number: 84745362

## 2019-11-11 NOTE — Progress Notes (Signed)
Remote ICD transmission.   

## 2019-11-26 ENCOUNTER — Ambulatory Visit (INDEPENDENT_AMBULATORY_CARE_PROVIDER_SITE_OTHER): Payer: PRIVATE HEALTH INSURANCE

## 2019-11-26 ENCOUNTER — Other Ambulatory Visit: Payer: Self-pay

## 2019-11-26 DIAGNOSIS — Z5181 Encounter for therapeutic drug level monitoring: Secondary | ICD-10-CM

## 2019-11-26 DIAGNOSIS — I2102 ST elevation (STEMI) myocardial infarction involving left anterior descending coronary artery: Secondary | ICD-10-CM

## 2019-11-26 LAB — POCT INR: INR: 2.9 (ref 2.0–3.0)

## 2019-11-26 NOTE — Patient Instructions (Signed)
Description   Continue on same dosage 1 tablet daily except 1/2 tablet on Sundays and Thursdays. Recheck INR in 5 weeks. Coumadin Clinic [phone] Main [phone]

## 2019-12-31 ENCOUNTER — Ambulatory Visit (INDEPENDENT_AMBULATORY_CARE_PROVIDER_SITE_OTHER): Payer: Self-pay

## 2019-12-31 ENCOUNTER — Other Ambulatory Visit: Payer: Self-pay

## 2019-12-31 DIAGNOSIS — Z5181 Encounter for therapeutic drug level monitoring: Secondary | ICD-10-CM | POA: Diagnosis not present

## 2019-12-31 DIAGNOSIS — I2102 ST elevation (STEMI) myocardial infarction involving left anterior descending coronary artery: Secondary | ICD-10-CM | POA: Diagnosis not present

## 2019-12-31 LAB — POCT INR: INR: 2.6 (ref 2.0–3.0)

## 2019-12-31 NOTE — Patient Instructions (Signed)
Description   - Continue on same dosage 1 tablet daily except 1/2 tablet on Sundays and Thursdays.  - Recheck INR in 6 weeks.  Coumadin Clinic [phone] Main [phone]    

## 2020-01-02 ENCOUNTER — Other Ambulatory Visit: Payer: Self-pay | Admitting: Cardiology

## 2020-01-30 ENCOUNTER — Other Ambulatory Visit: Payer: Self-pay | Admitting: Cardiology

## 2020-02-07 ENCOUNTER — Telehealth: Payer: Self-pay

## 2020-02-07 NOTE — Telephone Encounter (Signed)
Biotronik alert received 12/10/19 for 1 NSVT event, unable to see onset and no duration given.   Patient called states he was aware something was going on but wasn't sure what (vague symptoms). Reports compliance with all medications including Coreg 12.5 mg BID, Coumadin 5 mg (as directed).   Advised patient to call with worsening symptoms. Advised I will forward to [name] and we will call if there are changes. Patient agreeable to plan.

## 2020-02-11 ENCOUNTER — Other Ambulatory Visit: Payer: Self-pay

## 2020-02-11 ENCOUNTER — Ambulatory Visit (INDEPENDENT_AMBULATORY_CARE_PROVIDER_SITE_OTHER): Payer: PRIVATE HEALTH INSURANCE

## 2020-02-11 DIAGNOSIS — I2102 ST elevation (STEMI) myocardial infarction involving left anterior descending coronary artery: Secondary | ICD-10-CM | POA: Diagnosis not present

## 2020-02-11 DIAGNOSIS — Z5181 Encounter for therapeutic drug level monitoring: Secondary | ICD-10-CM

## 2020-02-11 LAB — POCT INR: INR: 2.2 (ref 2.0–3.0)

## 2020-02-11 NOTE — Patient Instructions (Signed)
Description   - Continue on same dosage 1 tablet daily except 1/2 tablet on Sundays and Thursdays.  - Recheck INR in 6 weeks.  Coumadin Clinic [phone] Main [phone]    

## 2020-02-14 NOTE — Telephone Encounter (Signed)
No change. We typically due not treat NSVT.

## 2020-02-18 ENCOUNTER — Other Ambulatory Visit: Payer: Self-pay | Admitting: Student

## 2020-02-24 ENCOUNTER — Telehealth: Payer: Self-pay | Admitting: Internal Medicine

## 2020-02-24 MED ORDER — CARVEDILOL 12.5 MG PO TABS: 12.5000 mg | ORAL_TABLET | Freq: Two times a day (BID) | ORAL | 3 refills | Status: DC

## 2020-02-24 NOTE — Telephone Encounter (Signed)
Refill sent to pharmacy.   

## 2020-02-24 NOTE — Telephone Encounter (Signed)
Pt c/o medication issue:  1. Name of Medication: carvedilol (COREG) 12.5 MG tablet(Expired)    2. How are you currently taking this medication (dosage and times per day)? As written  3. Are you having a reaction (difficulty breathing--STAT)? No  4. What is your medication issue? Patient needs a new prescription refilled

## 2020-02-28 IMAGING — MR MR CARDIA MORPHOLOGY WITHOUT AND WITH CONTRAST
18 of 20 series · 38 of 40 positions shown · IV contrast (gadavist)
Comparison: none

CLINICAL DATA: 58-year-old male post STEMI on 05/06/2018 with
suspicion for an apical thrombus on the echocardiogram.

EXAM:
CARDIAC MRI
TECHNIQUE: The patient was scanned on a 1.5 Tesla GE magnet. A dedicated
cardiac coil was used. Functional imaging was done using Fiesta
sequences. [DATE], and 4 chamber views were done to assess for RWMA's.
Modified Shanika rule using a short axis stack was used to
calculate an ejection fraction on a dedicated work station using
Circle software. The patient received 10 cc of Gadavist. After 10
minutes inversion recovery sequences were used to assess for
infiltration and scar tissue.
CONTRAST:  10 cc  of Gadavist

[Series 6: bSSFP · oblique · 8.0mm · 1.52mm/px · 16 of 375 slices shown (1 of 5)]
[im 1/375]
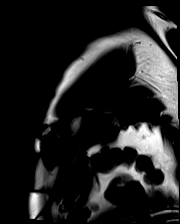
[im 25/375]
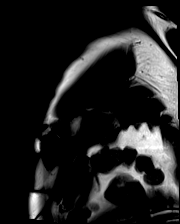
[im 50/375]
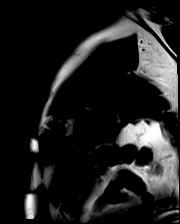
[im 75/375]
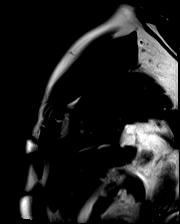
[im 100/375]
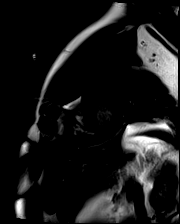
[im 125/375]
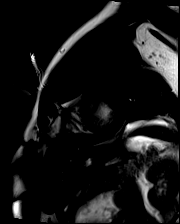
[im 150/375]
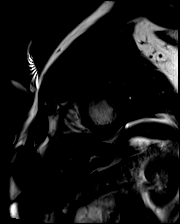
[im 175/375]
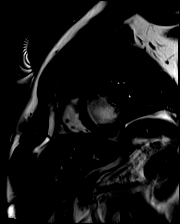
[im 200/375]
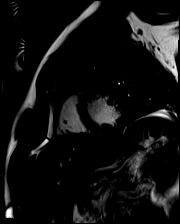
[im 225/375]
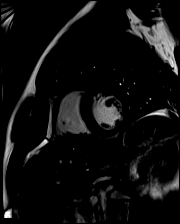
[im 250/375]
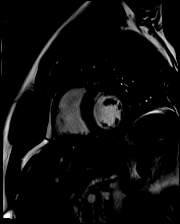
[im 275/375]
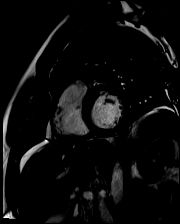
[im 300/375]
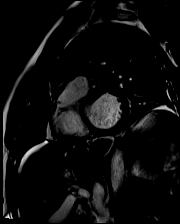
[im 325/375]
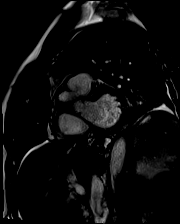
[im 350/375]
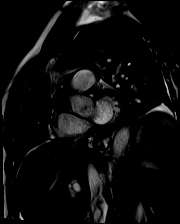
[im 375/375]
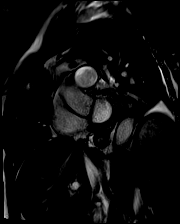

[Series 7: bSSFP · sagittal · 8.0mm · 1.52mm/px · 6 of 175 slices shown (2 of 5)]
[im 1/175]
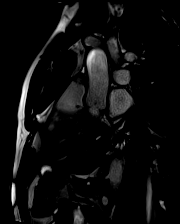
[im 35/175]
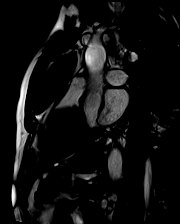
[im 70/175]
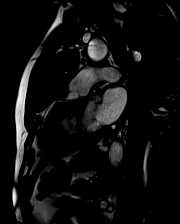
[im 105/175]
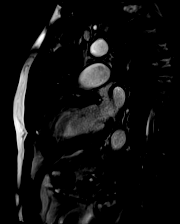
[im 140/175]
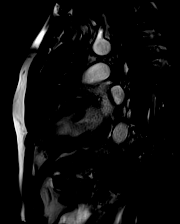
[im 175/175]
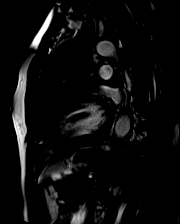

[Series 8: t2_stir_db_sax · oblique · 8.0mm · 1.73mm/px · 1 of 15 slices shown]
[im 1/15]
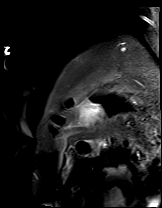

[Series 11: bSSFP · axial · 6.5mm · 1.41mm/px · 1 of 25 slices shown (3 of 5)]
[im 1/25]
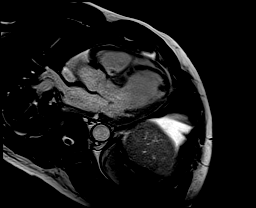

[Series 12: bSSFP · axial · 6.5mm · 1.41mm/px · 1 of 25 slices shown (4 of 5)]
[im 1/25]
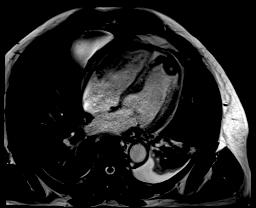

[Series 13: bSSFP · oblique · 6.5mm · 1.41mm/px · 1 of 25 slices shown (5 of 5)]
[im 1/25]
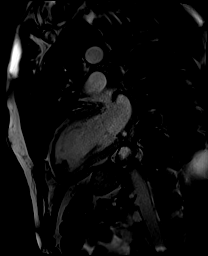

[Series 17: lge_single shot sa · oblique · 8.0mm · 1.98mm/px · 1 of 15 slices shown (1 of 2)]
[im 1/15]
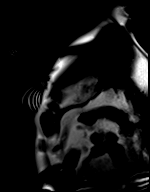

[Series 18: lge_single shot sa · oblique · 8.0mm · 1.98mm/px · 1 of 15 slices shown (2 of 2)]
[im 1/15]
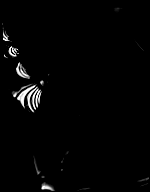

[Series 19: lge_single shot radial_mag · axial · 6.5mm · 1.98mm/px · 1 of 1 slices shown]
[im 1/1]
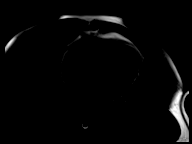

[Series 20: lge_single shot radial_psir · axial · 6.5mm · 1.98mm/px · 1 of 1 slices shown]
[im 1/1]
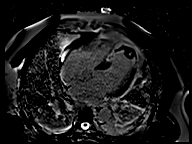

[Series 26: lge short axis_mag · oblique · 8.0mm · 1.61mm/px · 1 of 13 slices shown]
[im 1/13]
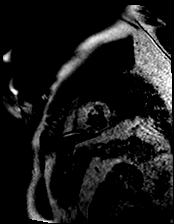

[Series 27: lge short axis_psir · oblique · 8.0mm · 1.61mm/px · 1 of 13 slices shown]
[im 1/13]
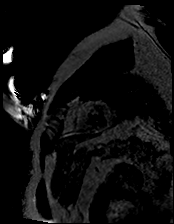

[Series 28: lge radial ((date)ch)_mag · axial · 6.5mm · 1.61mm/px · 1 of 1 slices shown (1 of 2)]
[im 1/1]
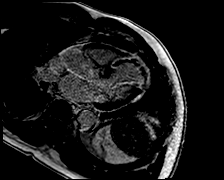

[Series 29: lge radial ((date)ch)_psir · axial · 6.5mm · 1.61mm/px · 1 of 1 slices shown (1 of 2)]
[im 1/1]
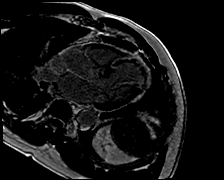

[Series 30: lge radial ((date)ch)_mag · axial · 6.5mm · 1.61mm/px · 1 of 1 slices shown (2 of 2)]
[im 1/1]
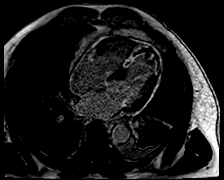

[Series 31: lge radial ((date)ch)_psir · axial · 6.5mm · 1.61mm/px · 1 of 1 slices shown (2 of 2)]
[im 1/1]
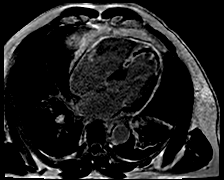

[Series 36: lge sa bh_mag · oblique · 8.0mm · 1.67mm/px · 1 of 13 slices shown]
[im 1/13]
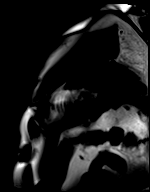

[Series 37: lge sa bh_psir · oblique · 8.0mm · 1.67mm/px · 1 of 13 slices shown]
[im 1/13]
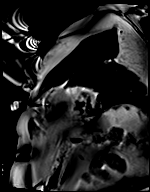

[38 of 40 positions shown; findings below may reference images not displayed]

FINDINGS: 1. Normal left ventricular size, thickness and systolic function
(LVEF = 38%). There are akinesis of the mid anterior, anteroseptal,
inferoseptal and apical anterior and septal walls.

There is endocardial late gadolinium enhancement in the mid
anterior, anteroseptal, inferoseptal and apical anterior and septal
walls with 75-100% transmurality and signs of reperfusion. There is
also severe edema in those segments.

A large multilobular thrombus measuring 26 x 19 x 16 mm is present
in the left ventricular apex.

LVEDD: 57 mm

LVESD: 41 mm

LVEDV: 158 ml

LVESV: 99 ml

SV: 60 ml

CO: 5.6 L/min

Myocardial mass: 189 g

2. Normal right ventricular size, thickness and systolic function
(LVEF = 50%). There are no regional wall motion abnormalities.

3. Mildly dilated left atrium. Normal right atrial size.

4. Normal size of the aortic root, ascending aorta. Mildly dilated
pulmonary artery measuring 34 mm.

5. Mild mitral and tricuspid regurgitation.

6. Normal pericardium.  No pericardial effusion.
IMPRESSION: 1. Normal left ventricular size, thickness and systolic function
(LVEF = 38%). There are akinesis of the mid anterior, anteroseptal,
inferoseptal and apical anterior and septal walls. There is
endocardial late gadolinium enhancement in the mid anterior,
anteroseptal, inferoseptal and apical anterior and septal walls with
75-100% trans-murality and signs of reperfusion. There is also
severe edema in those segments. A large multilobular thrombus
measuring 26 x 19 x 16 mm is present in the left ventricular apex.

2. Normal right ventricular size, thickness and systolic function
(LVEF = 50%). There are no regional wall motion abnormalities.

3. Mildly dilated left atrium. Normal right atrial size.

4. Normal size of the aortic root, ascending aorta. Mildly dilated
pulmonary artery measuring 34 mm.

5. Mild mitral and tricuspid regurgitation.

6. Normal pericardium.  No pericardial effusion.

A repeat MRI or echocardiogram is recommended as significant late
gadolinium enhancement might represent severe edema. An
anticoagulation is recommended for the apical thrombus.

## 2020-03-08 ENCOUNTER — Other Ambulatory Visit: Payer: Self-pay | Admitting: Cardiology

## 2020-03-24 ENCOUNTER — Other Ambulatory Visit: Payer: Self-pay

## 2020-03-24 ENCOUNTER — Ambulatory Visit (INDEPENDENT_AMBULATORY_CARE_PROVIDER_SITE_OTHER): Payer: Self-pay | Admitting: *Deleted

## 2020-03-24 DIAGNOSIS — Z5181 Encounter for therapeutic drug level monitoring: Secondary | ICD-10-CM

## 2020-03-24 DIAGNOSIS — I2102 ST elevation (STEMI) myocardial infarction involving left anterior descending coronary artery: Secondary | ICD-10-CM

## 2020-03-24 LAB — POCT INR: INR: 2.7 (ref 2.0–3.0)

## 2020-03-24 NOTE — Patient Instructions (Signed)
Description   - Continue on same dosage 1 tablet daily except 1/2 tablet on Sundays and Thursdays.  - Recheck INR in 6 weeks.  Coumadin Clinic [phone] Main [phone]    

## 2020-04-05 ENCOUNTER — Other Ambulatory Visit: Payer: Self-pay | Admitting: Internal Medicine

## 2020-04-29 ENCOUNTER — Other Ambulatory Visit: Payer: Self-pay | Admitting: Internal Medicine

## 2020-04-29 ENCOUNTER — Encounter: Payer: Self-pay | Admitting: Cardiology

## 2020-04-29 ENCOUNTER — Other Ambulatory Visit: Payer: Self-pay

## 2020-04-29 ENCOUNTER — Ambulatory Visit (INDEPENDENT_AMBULATORY_CARE_PROVIDER_SITE_OTHER): Payer: Self-pay | Admitting: Cardiology

## 2020-04-29 VITALS — BP 136/78 | HR 72 | Ht 68.0 in | Wt 213.8 lb

## 2020-04-29 DIAGNOSIS — I255 Ischemic cardiomyopathy: Secondary | ICD-10-CM | POA: Insufficient documentation

## 2020-04-29 DIAGNOSIS — I10 Essential (primary) hypertension: Secondary | ICD-10-CM

## 2020-04-29 DIAGNOSIS — E7800 Pure hypercholesterolemia, unspecified: Secondary | ICD-10-CM

## 2020-04-29 DIAGNOSIS — I5022 Chronic systolic (congestive) heart failure: Secondary | ICD-10-CM

## 2020-04-29 DIAGNOSIS — I2510 Atherosclerotic heart disease of native coronary artery without angina pectoris: Secondary | ICD-10-CM | POA: Insufficient documentation

## 2020-04-29 MED ORDER — ENTRESTO 24-26 MG PO TABS: 1.0000 | ORAL_TABLET | Freq: Two times a day (BID) | ORAL | 1 refills | Status: DC

## 2020-04-29 NOTE — Progress Notes (Signed)
Cardiology Office Note:    Date:  04/29/2020   ID:  Caleb Thomas, DOB 1960/06/05, MRN [id number]  PCP:  Carlena Hurl, PA-C  Cardiologist:  Fransico Him, MD    Referring MD: Carlena Hurl, PA-C   Chief Complaint  Patient presents with  . Coronary Artery Disease  . Hypertension  . Hyperlipidemia  . Cardiomyopathy    History of Present Illness:    Caleb Thomas is a 60 y.o. male with a hx of hyperlipidemia, hypertension, remote history of tobacco and ASCAD with admission 04/2018 with Anterior STEMI. Cath showed severe single-vessel CAD with occluded mid LAD and faint left-to-left collaterals. Given onset of symptoms 3-4 days ago and lack of ongoing chest pain,  recommended medical therapy, including 12 months of dual antiplatelet therapy with aspirin and clopidogrel. EF in the 25% range with what appears to be LV dilatation and anteroapical severe hypokinesia. No signs of volume overload. Started on Carvedilol and spironolactone and eventually ACE I. Echo with contrastdid not show LV thrombus butfollow up cardiac MRI confirmed an LV thrombus and he was started on coumadin and LifeVest was placed.     He has chronic systolic dysfunction. Followup 2D echo on max GDMT as BP tolerated showed persistence of LV dysfunction with EF 25-30% .  He was referred to EP and underwent AICD placement for primary prevention in 01/2019 and is followed in Device clinic by [name].    He is here today for followup and is doing well.  He denies any chest pain or pressure but has chronic exertional fatigue as well as DOE ever since his MI that is stable and has not changed since his MI.  He denies any  PND, orthopnea, LE edema, dizziness, palpitations or syncope. He is compliant with his meds and is tolerating meds with no SE.    Past Medical History:  Diagnosis Date  . CAD (coronary artery disease), native coronary artery 04/2018   s/p anterior STEMI with severe single-vessel CAD with occluded mid  LAD and faint left-to-left collaterals.  . Hyperlipidemia   . Hypertension 2015  . Ischemic cardiomyopathy    EF 25-30% by echo 01/2019 s/p AICD implant    Past Surgical History:  Procedure Laterality Date  . COLONOSCOPY  2013   "normal" per patient, [place]  . ICD IMPLANT N/A 02/07/2019   Procedure: ICD IMPLANT;  Surgeon: Evans Lance, MD;  Location: [place] [facility];  Service: Cardiovascular;  Laterality: N/A;  . LEFT HEART CATH AND CORONARY ANGIOGRAPHY N/A 05/06/2018   Procedure: LEFT HEART CATH AND CORONARY ANGIOGRAPHY;  Surgeon: [name], MD;  Location: [place] [facility];  Service: Cardiovascular;  Laterality: N/A;    Current Medications: Current Meds  Medication Sig  . aspirin 81 MG EC tablet Take 1 tablet (81 mg total) by mouth daily.  Marland Kitchen atorvastatin (LIPITOR) 80 MG tablet Take 1 tablet (80 mg total) by mouth daily.  . carvedilol (COREG) 12.5 MG tablet Take 1 tablet (12.5 mg total) by mouth 2 (two) times daily.  . clopidogrel (PLAVIX) 75 MG tablet Take 1 tablet (75 mg total) by mouth daily with breakfast.  . sacubitril-valsartan (ENTRESTO) 24-26 MG Take 1 tablet by mouth 2 (two) times daily.  Marland Kitchen spironolactone (ALDACTONE) 25 MG tablet Take 1 tablet (25 mg total) by mouth daily.  Marland Kitchen warfarin (COUMADIN) 5 MG tablet TAKE AS DIRECTED BY  COUMADIN  CLINIC  . [DISCONTINUED] lisinopril (ZESTRIL) 5 MG tablet Take 0.5 tablets (2.5 mg total)  by mouth in the morning and at bedtime. Take 1/2 (one-half) tablet by mouth twice daily - pt needs to keep upcoming appt in March for further refills     Allergies:   Patient has no known allergies.   Social History   Socioeconomic History  . Marital status: Married    Spouse name: Not on file  . Number of children: Not on file  . Years of education: Not on file  . Highest education level: Not on file  Occupational History  . Not on file  Tobacco Use  . Smoking status: Former Research scientist (life sciences)  . Smokeless tobacco: Never Used  . Tobacco  comment: quit smoking 60 years old ago  Substance and Sexual Activity  . Alcohol use: Yes    Alcohol/week: 3.0 standard drinks    Types: 3 Cans of beer per week    Comment: on weekend   . Drug use: Not on file  . Sexual activity: Not on file  Other Topics Concern  . Not on file  Social History Narrative   Married, 10 children, 4 grandchildren.   Surveyor, mining at [employer].   Exercise with walking on treadmill few days per week.   Member of [employer].    06/2017   Social Determinants of Health   Financial Resource Strain: Not on file  Food Insecurity: Not on file  Transportation Needs: Not on file  Physical Activity: Not on file  Stress: Not on file  Social Connections: Not on file     Family History: The patient's family history includes Heart disease in his father; Hypertension in his brother and brother; Stroke in his brother and mother. There is no history of Cancer.  ROS:   Please see the history of present illness.    ROS  All other systems reviewed and negative.   EKGs/Labs/Other Studies Reviewed:    The following studies were reviewed today:  2D echo 10/2018 IMPRESSIONS   1. The left ventricle has severely reduced systolic function, with an  ejection fraction of 25-30%. The cavity size was normal. Severe basal  septal hypertrophy. Left ventricular diastolic Doppler parameters are  consistent with pseudonormalization.  2. There is akinessis of the mid anteroseptal and inferoseptal, apical  and apical lateral walls. There is akinesis of the mid and apical  inferior, anterior and apical inferolateral walls. No evidence of apical  thrombus by definity contrast study.  3. The right ventricle has normal systolic function. The cavity was  normal. There is no increase in right ventricular wall thickness. Right  ventricular systolic pressure is normal with an estimated pressure of 32.3  mmHg.  4. Left atrial size was mildly dilated.  5. The aortic valve is  tricuspid. Moderate sclerosis of the aortic valve.  Aortic valve regurgitation is trivial by color flow Doppler.  6. The aorta is normal unless otherwise noted.   EKG:  EKG is  ordered today and showed NSR with anterior MI and LAFB and nonspecific ST abnormality in lateral precordial leads .  Recent Labs: No results found for requested labs within last 8760 hours.   Recent Lipid Panel    Component Value Date/Time   CHOL 142 05/07/2018 0654   CHOL 157 03/09/2018 1220   TRIG 198 (H) 05/07/2018 0654   HDL 15 (L) 05/07/2018 0654   HDL 35 (L) 03/09/2018 1220   CHOLHDL 9.5 05/07/2018 0654   VLDL 40 05/07/2018 0654   LDLCALC 87 05/07/2018 0654   LDLCALC 82 03/09/2018 1220  [place] 85 03/06/2017 1138    Physical Exam:    VS:  BP 136/78   Pulse 72   Ht 5\' 8"  (1.727 m)   Wt 213 lb 12.8 oz (97 kg)   SpO2 95%   BMI 32.51 kg/m     Wt Readings from Last 3 Encounters:  04/29/20 213 lb 12.8 oz (97 kg)  05/14/19 205 lb 12.8 oz (93.4 kg)  02/07/19 192 lb (87.1 kg)     GEN:  Well nourished, well developed in no acute distress HEENT: Normal NECK: No JVD; No carotid bruits LYMPHATICS: No lymphadenopathy CARDIAC: RRR, no murmurs, rubs, gallops RESPIRATORY:  Clear to auscultation without rales, wheezing or rhonchi  ABDOMEN: Soft, non-tender, non-distended MUSCULOSKELETAL:  No edema; No deformity  SKIN: Warm and dry NEUROLOGIC:  Alert and oriented x 3 PSYCHIATRIC:  Normal affect   ASSESSMENT:    1. Chronic systolic heart failure ([place])   2. Coronary artery disease involving native coronary artery of native heart without angina pectoris   3. Ischemic cardiomyopathy   4. Essential hypertension   5. Pure hypercholesterolemia    PLAN:    In order of problems listed above:  1.  ASCAD -s/p Anterior STEMI 04/2018 with cath showing severe single-vessel CAD with occluded mid LAD and faint left-to-left collaterals. Given onset of symptoms 3-4 days ago and lack of ongoing chest pain,   recommended medical therapy -he has not had any anginal symptoms of chest pain but still has issues with exertional fatigue and DOE which have been present since his MI -continue ASA, Plavix 75mg  daily, statin and BB  2.  Ischemic DCM -EF 25-30% by echo 01/2019 s/p AICD for primary prevention -try to maximize GDMT was much as BP allows -see #3  3.  Chronic systolic CHF -last EF assessment 25-30% -he does not appear volume overloaded on exam today but his weight has trended 8lbs upward over the past year -he is having DOE that is hard to discern whether this has gotten worse - he says it is the same as when he had the MI -optivol on his AICD check today was normal and does not show volume overload -suspect that his exertional fatigue and DOE are related to obesity and chronic low output heart failure -continue carvedilol and spiro -he has not been on Entresto -will stop ACE I and start Entresto 24-26mg  BID 48 hours after stopping ACE I -check BMET in 1 week -followup with PHarmD in 2 weeks for uptitration of his HF meds -if we cannot get him feeling better on max GDMT then will refer to AHF clinic  4.  HTN -BP controlled on exam today -continue spiro 25mg  daily, carvedilol 12.5mg  BID -stop lisinopril -start Entrestro 24-26mg  BID once off ACE I for 48 hours -check BP daily for a week and call with results   5.  HLD -LDL goal < 70 -LDL was 71 in Aug 2021 -continue atorvastatin 80mg  daily   Medication Adjustments/Labs and Tests Ordered: Current medicines are reviewed at length with the patient today.  Concerns regarding medicines are outlined above.  Orders Placed This Encounter  Procedures  . Basic metabolic panel  . AMB Referral to [facility]  . EKG 12-Lead   Meds ordered this encounter  Medications  . sacubitril-valsartan (ENTRESTO) 24-26 MG    Sig: Take 1 tablet by mouth 2 (two) times daily.    Dispense:  60 tablet    Refill:  1    Signed, Fransico Him, MD  04/29/2020 3:18 PM    [facility]

## 2020-04-29 NOTE — Patient Instructions (Addendum)
Medication Instructions:  Your physician has recommended you make the following change in your medication:  1) STOP taking lisinopril 2) START taking Entresto 24-26 mg twice daily on Saturday 3/5.   *If you need a refill on your cardiac medications before your next appointment, please call your pharmacy*   Lab Work: BMET in one week If you have labs (blood work) drawn today and your tests are completely normal, you will receive your results only by: Marland Kitchen MyChart Message (if you have MyChart) OR . A paper copy in the mail If you have any lab test that is abnormal or we need to change your treatment, we will call you to review the results.  Follow-Up: At [facility], you and your health needs are our priority.  As part of our continuing mission to provide you with exceptional heart care, we have created designated Provider Care Teams.  These Care Teams include your primary Cardiologist (physician) and Advanced Practice Providers (APPs -  Physician Assistants and Nurse Practitioners) who all work together to provide you with the care you need, when you need it.  Your next appointment:   2 month(s)  The format for your next appointment:   In Person  Provider:   You may see Fransico Him, MD or one of the following Advanced Practice Providers on your designated Care Team:    Melina Copa, PA-C  Ermalinda Barrios, PA-C  Other Instructions You have been referred to see the Pharmacist in two weeks.

## 2020-05-01 ENCOUNTER — Telehealth: Payer: Self-pay | Admitting: Cardiology

## 2020-05-01 MED ORDER — LOSARTAN POTASSIUM 25 MG PO TABS: 25.0000 mg | ORAL_TABLET | Freq: Every day | ORAL | 6 refills | Status: DC

## 2020-05-01 NOTE — Telephone Encounter (Signed)
Pt aware and script sent to pharmacy ./cy

## 2020-05-01 NOTE — Telephone Encounter (Signed)
Follow Up:     Pt wants supposed to let Dr Radford Pax know if his new price was good for him. He said the price for his new price is good for him. He also wants to know if he still need to come in for lab work on 05-05-20?

## 2020-05-01 NOTE — Telephone Encounter (Signed)
Spoke with pt and was suppose to start [facility] Saturday Pt went to pharmacy to pick up and is going to cost $600.00 per month Pt cannot afford this he is wanting to go back on Lisinopril .Adonis Housekeeper

## 2020-05-01 NOTE — Telephone Encounter (Signed)
OK lets stop entresto and go on Losartan 25mg  daily instead

## 2020-05-01 NOTE — Telephone Encounter (Signed)
Pt c/o medication issue:  1. Name of Medication: sacubitril-valsartan (ENTRESTO) 24-26 MG  2. How are you currently taking this medication (dosage and times per day)? Patient has not started medication yet   3. Are you having a reaction (difficulty breathing--STAT)? no  4. What is your medication issue? Patient said he went to the pharmacy to check on the cost of the medication and it is too expensive. He does not have insurance. He wanted to know if he could go back on the other medication. Please advise

## 2020-05-03 NOTE — Progress Notes (Signed)
Cardiology Office Note Date:  05/03/2020  Patient ID:  Caleb Thomas, DOB May 18, 1960, MRN [id number] PCP:  Carlena Hurl, PA-C  Cardiologist:  Dr. Radford Pax Electrophysiologist: [name]    Chief Complaint:  annual visit  History of Present Illness: Caleb Thomas is a 60 y.o. male with history of CAD (04/2018 with Anterior STEMI. Cath showed severe single-vessel CAD with occluded mid LAD and faint left-to-left collaterals, medical management), LV thrombus, chronic CHF (systolic), ICM, ICD, HTN, HLD.  He comes today to be seen for [name], last seen by him march 2021, reports class II symptoms, no changes were made.  More recently saw Dr. Radford Pax 04/29/20, he was doing well, with some chronic fatigue at his baseline. Transitioned from his ACE to [facility], plans for labs and [facility] follow up for titration.  TODAY He is doing great. Delene Loll was too expensive and is on losartan.  Mentions cough he had periodically is gone off the ACE. No CP, palpitations. No SOB, DOE No swelling, has not felt like he has been retaining water now in quite a long time. No dizzy spells, near syncope or syncope. No bleeding or signs of bleeding   Device information Biotronik single chamber ICD implanted 02/07/2019 Has VDD lead   Past Medical History:  Diagnosis Date  . CAD (coronary artery disease), native coronary artery 04/2018   s/p anterior STEMI with severe single-vessel CAD with occluded mid LAD and faint left-to-left collaterals.  . Hyperlipidemia   . Hypertension 2015  . Ischemic cardiomyopathy    EF 25-30% by echo 01/2019 s/p AICD implant    Past Surgical History:  Procedure Laterality Date  . COLONOSCOPY  2013   "normal" per patient, [place]  . ICD IMPLANT N/A 02/07/2019   Procedure: ICD IMPLANT;  Surgeon: Evans Lance, MD;  Location: [place] [facility];  Service: Cardiovascular;  Laterality: N/A;  . LEFT HEART CATH AND CORONARY ANGIOGRAPHY N/A 05/06/2018   Procedure: LEFT HEART  CATH AND CORONARY ANGIOGRAPHY;  Surgeon: [name], MD;  Location: [place] [facility];  Service: Cardiovascular;  Laterality: N/A;    Current Outpatient Medications  Medication Sig Dispense Refill  . aspirin 81 MG EC tablet Take 1 tablet (81 mg total) by mouth daily. 90 tablet 3  . atorvastatin (LIPITOR) 80 MG tablet Take 1 tablet (80 mg total) by mouth daily. 90 tablet 0  . carvedilol (COREG) 12.5 MG tablet Take 1 tablet (12.5 mg total) by mouth 2 (two) times daily. 180 tablet 3  . clopidogrel (PLAVIX) 75 MG tablet TAKE 1 TABLET BY MOUTH DAILY WITH BREAKFAST 90 tablet 0  . losartan (COZAAR) 25 MG tablet Take 1 tablet (25 mg total) by mouth daily. 30 tablet 6  . nitroGLYCERIN (NITROSTAT) 0.4 MG SL tablet Place 1 tablet (0.4 mg total) under the tongue every 5 (five) minutes as needed for chest pain. 25 tablet 4  . spironolactone (ALDACTONE) 25 MG tablet Take 1 tablet (25 mg total) by mouth daily. 90 tablet 3  . warfarin (COUMADIN) 5 MG tablet TAKE AS DIRECTED BY  COUMADIN  CLINIC 35 tablet 3   No current facility-administered medications for this visit.    Allergies:   Patient has no known allergies.   Social History:  The patient  reports that he has quit smoking. He has never used smokeless tobacco. He reports current alcohol use of about 3.0 standard drinks of alcohol per week.   Family History:  The patient's family history includes Heart disease in  his father; Hypertension in his brother and brother; Stroke in his brother and mother.  ROS:  Please see the history of present illness.    All other systems are reviewed and otherwise negative.   PHYSICAL EXAM:  VS:  There were no vitals taken for this visit. BMI: There is no height or weight on file to calculate BMI. Well nourished, well developed, in no acute distress HEENT: normocephalic, atraumatic Neck: no JVD, carotid bruits or masses Cardiac:  RRR; no significant murmurs, no rubs, or gallops Lungs:  CTA b/l, no wheezing,  rhonchi or rales Abd: soft, nontender MS: no deformity or atrophy Ext: no edema Skin: warm and dry, no rash Neuro:  No gross deficits appreciated Psych: euthymic mood, full affect   ICD site is stable, no tethering or discomfort   EKG:  Not done today  Device interrogation done today and reviewed by myself:  Battery and lead measurements are good NSVT only   2D echo 10/2018 IMPRESSIONS  1. The left ventricle has severely reduced systolic function, with an  ejection fraction of 25-30%. The cavity size was normal. Severe basal  septal hypertrophy. Left ventricular diastolic Doppler parameters are  consistent with pseudonormalization.  2. There is akinessis of the mid anteroseptal and inferoseptal, apical  and apical lateral walls. There is akinesis of the mid and apical  inferior, anterior and apical inferolateral walls. No evidence of apical  thrombus by definity contrast study.  3. The right ventricle has normal systolic function. The cavity was  normal. There is no increase in right ventricular wall thickness. Right  ventricular systolic pressure is normal with an estimated pressure of 32.3  mmHg.  4. Left atrial size was mildly dilated.  5. The aortic valve is tricuspid. Moderate sclerosis of the aortic valve.  Aortic valve regurgitation is trivial by color flow Doppler.  6. The aorta is normal unless otherwise noted.    05/06/2018: [facility] Conclusions: 1. Severe single-vessel CAD with occluded mid LAD and faint left-to-left collaterals. 2. Severely reduced LVEF with anterior and apical akinesis; question LV apical thrombus. 3. Mildly elevated left ventricular filling pressure (LVEDP 15-20 mmHg).  Recommendations: 1. Given onset of symptoms 3-4 days ago and lack of ongoing chest pain, I recommend medical therapy, including 12 months of dual antiplatelet therapy with aspirin and clopidogrel. 2. Obtain echo with Definity to assess for LV apical thrombus. I will restart  heparin infusion 2 hours after TR band deflation pending echo results. 3. Aggressive secondary prevention. 4. Given mildly elevated LVEDP and acute kidney injury, maintain net even fluid balance. May need to consider gentle diuresis if dyspnea develops.     Recent Labs: No results found for requested labs within last 8760 hours.  No results found for requested labs within last 8760 hours.   CrCl cannot be calculated (Patient's most recent lab result is older than the maximum 21 days allowed.).   Wt Readings from Last 3 Encounters:  04/29/20 213 lb 12.8 oz (97 kg)  05/14/19 205 lb 12.8 oz (93.4 kg)  02/07/19 192 lb (87.1 kg)     Other studies reviewed: Additional studies/records reviewed today include: summarized above  ASSESSMENT AND PLAN:  1. ICD     Intact function, no programming changes made  2. CAD     No anginal symptoms     On ASA, plavix, BB, statin  3. ICM 4. Chronic CHF (systolic)     No symptoms or exam findings of volume OL  On BB, ARB, diuretic     Delene Loll was cost prohibative   5. Hx of LV thrombus     On warfarin, Monitored and managed here    Disposition: F/u with Dr. Radford Pax as directed by her, remotes as usual and in clinic with EP in a year, sooner if needed.  Current medicines are reviewed at length with the patient today.  The patient did not have any concerns regarding medicines.  Venetia Night, PA-C 05/03/2020 1:28 PM     [place]  [phone] (office)  [phone] (fax)

## 2020-05-04 ENCOUNTER — Other Ambulatory Visit: Payer: Self-pay | Admitting: Internal Medicine

## 2020-05-04 NOTE — Telephone Encounter (Signed)
No need to get labs after change to Losartan

## 2020-05-05 ENCOUNTER — Encounter: Payer: Self-pay | Admitting: Physician Assistant

## 2020-05-05 ENCOUNTER — Ambulatory Visit (INDEPENDENT_AMBULATORY_CARE_PROVIDER_SITE_OTHER): Payer: Self-pay | Admitting: Physician Assistant

## 2020-05-05 ENCOUNTER — Other Ambulatory Visit: Payer: Self-pay

## 2020-05-05 ENCOUNTER — Ambulatory Visit (INDEPENDENT_AMBULATORY_CARE_PROVIDER_SITE_OTHER): Payer: Self-pay

## 2020-05-05 ENCOUNTER — Other Ambulatory Visit: Payer: Medicaid Other

## 2020-05-05 VITALS — BP 134/72 | HR 70 | Ht 68.0 in | Wt 213.0 lb

## 2020-05-05 DIAGNOSIS — I2102 ST elevation (STEMI) myocardial infarction involving left anterior descending coronary artery: Secondary | ICD-10-CM

## 2020-05-05 DIAGNOSIS — Z95810 Presence of automatic (implantable) cardiac defibrillator: Secondary | ICD-10-CM

## 2020-05-05 DIAGNOSIS — I5022 Chronic systolic (congestive) heart failure: Secondary | ICD-10-CM

## 2020-05-05 DIAGNOSIS — I2510 Atherosclerotic heart disease of native coronary artery without angina pectoris: Secondary | ICD-10-CM

## 2020-05-05 DIAGNOSIS — I255 Ischemic cardiomyopathy: Secondary | ICD-10-CM

## 2020-05-05 DIAGNOSIS — I513 Intracardiac thrombosis, not elsewhere classified: Secondary | ICD-10-CM

## 2020-05-05 DIAGNOSIS — Z79899 Other long term (current) drug therapy: Secondary | ICD-10-CM

## 2020-05-05 DIAGNOSIS — Z5181 Encounter for therapeutic drug level monitoring: Secondary | ICD-10-CM

## 2020-05-05 LAB — CUP PACEART INCLINIC DEVICE CHECK
Battery Voltage: 3.09 V
Brady Statistic RV Percent Paced: 0 %
Date Time Interrogation Session: 20220308135602
HighPow Impedance: 84 Ohm
Implantable Lead Implant Date: 20201210
Implantable Lead Location: 753860
Implantable Lead Model: 436909
Implantable Lead Serial Number: 8102423
Implantable Pulse Generator Implant Date: 20201210
Lead Channel Impedance Value: 500 Ohm
Lead Channel Pacing Threshold Amplitude: 0.5 V
Lead Channel Pacing Threshold Pulse Width: 0.4 ms
Lead Channel Sensing Intrinsic Amplitude: 20.9 mV
Lead Channel Sensing Intrinsic Amplitude: 5.1 mV
Lead Channel Setting Pacing Amplitude: 1.5 V
Lead Channel Setting Pacing Pulse Width: 0.4 ms
Lead Channel Setting Sensing Sensitivity: 0.8 mV
Pulse Gen Model: 429525
Pulse Gen Serial Number: 84745362

## 2020-05-05 LAB — POCT INR: INR: 1.8 — AB (ref 2.0–3.0)

## 2020-05-05 NOTE — Patient Instructions (Signed)
-   take 1.5 tablets warfarin tonight, then  - Continue on same dosage 1 tablet daily except 1/2 tablet on Sundays and Thursdays.  - Recheck INR in 6 weeks.  Coumadin Clinic [phone] Main [phone]

## 2020-05-05 NOTE — Patient Instructions (Addendum)
Medication Instructions:   Your physician recommends that you continue on your current medications as directed. Please refer to the Current Medication list given to you today.  *If you need a refill on your cardiac medications before your next appointment, please call your pharmacy*   Lab Work: [place]    If you have labs (blood work) drawn today and your tests are completely normal, you will receive your results only by: Marland Kitchen MyChart Message (if you have MyChart) OR . A paper copy in the mail If you have any lab test that is abnormal or we need to change your treatment, we will call you to review the results.   Testing/ProceduresBMET MAG AND CBC    Follow-Up: At [facility], you and your health needs are our priority.  As part of our continuing mission to provide you with exceptional heart care, we have created designated Provider Care Teams.  These Care Teams include your primary Cardiologist (physician) and Advanced Practice Providers (APPs -  Physician Assistants and Nurse Practitioners) who all work together to provide you with the care you need, when you need it.  We recommend signing up for the patient portal called "MyChart".  Sign up information is provided on this After Visit Summary.  MyChart is used to connect with patients for Virtual Visits (Telemedicine).  Patients are able to view lab/test results, encounter notes, upcoming appointments, etc.  Non-urgent messages can be sent to your provider as well.   To learn more about what you can do with MyChart, go to [email or web address].    Your next appointment:   1 year(s)  The format for your next appointment:   In Person  Provider:   You may see [name]  or one of the following Advanced Practice Providers on your designated Care Team:    Chanetta Marshall, NP  Tommye Standard, PA-C  Legrand Como "Oda Kilts, [place]    Other Instructions

## 2020-05-06 LAB — BASIC METABOLIC PANEL
BUN/Creatinine Ratio: 15 (ref 10–24)
BUN: 14 mg/dL (ref 8–27)
CO2: 16 mmol/L — ABNORMAL LOW (ref 20–29)
Calcium: 9.1 mg/dL (ref 8.6–10.2)
Chloride: 106 mmol/L (ref 96–106)
Creatinine, Ser: 0.96 mg/dL (ref 0.76–1.27)
Glucose: 88 mg/dL (ref 65–99)
Potassium: 4.3 mmol/L (ref 3.5–5.2)
Sodium: 137 mmol/L (ref 134–144)
eGFR: 89 mL/min/{1.73_m2} (ref >59)

## 2020-05-06 LAB — CBC
Hematocrit: 44.1 % (ref 37.5–51.0)
Hemoglobin: 15.4 g/dL (ref 13.0–17.7)
MCH: 29 pg (ref 26.6–33.0)
MCHC: 34.9 g/dL (ref 31.5–35.7)
MCV: 83 fL (ref 79–97)
Platelets: 270 10*3/uL (ref 150–450)
RBC: 5.31 x10E6/uL (ref 4.14–5.80)
RDW: 14.7 % (ref 11.6–15.4)
WBC: 6.9 10*3/uL (ref 3.4–10.8)

## 2020-05-06 LAB — MAGNESIUM: Magnesium: 2 mg/dL (ref 1.6–2.3)

## 2020-05-11 ENCOUNTER — Ambulatory Visit (INDEPENDENT_AMBULATORY_CARE_PROVIDER_SITE_OTHER): Payer: Medicaid Other

## 2020-05-11 DIAGNOSIS — I5022 Chronic systolic (congestive) heart failure: Secondary | ICD-10-CM

## 2020-05-11 DIAGNOSIS — I255 Ischemic cardiomyopathy: Secondary | ICD-10-CM

## 2020-05-11 LAB — CUP PACEART REMOTE DEVICE CHECK
Date Time Interrogation Session: 20220314153123
Implantable Lead Implant Date: 20201210
Implantable Lead Location: 753860
Implantable Lead Model: 436909
Implantable Lead Serial Number: 8102423
Implantable Pulse Generator Implant Date: 20201210
Pulse Gen Model: 429525
Pulse Gen Serial Number: 84745362

## 2020-05-14 ENCOUNTER — Other Ambulatory Visit: Payer: Self-pay

## 2020-05-14 ENCOUNTER — Ambulatory Visit (INDEPENDENT_AMBULATORY_CARE_PROVIDER_SITE_OTHER): Payer: Self-pay | Admitting: Pharmacist

## 2020-05-14 VITALS — BP 116/78 | HR 74 | Wt 208.0 lb

## 2020-05-14 DIAGNOSIS — I5022 Chronic systolic (congestive) heart failure: Secondary | ICD-10-CM

## 2020-05-14 MED ORDER — METOPROLOL SUCCINATE ER 200 MG PO TB24: 200.0000 mg | ORAL_TABLET | Freq: Every day | ORAL | 3 refills | Status: DC

## 2020-05-14 NOTE — Patient Instructions (Addendum)
Stop taking carvedilol. Start taking metoprolol succinate 200mg  - 1 tablet once daily  Continue taking your other medications.  I'll submit patient assistance forms for Entresto (this would replace your losartan if approved) and Jardiance/Farxiga (you would only take 1 of these medications - I'm hoping that one of the manufacturers will cover the cost for you). I will give you a call when we hear back from these foundations.  Continue to monitor your blood pressure and heart rate at home. Your goal blood pressure is < 130/80mmHg and your goal heart rate is 60.

## 2020-05-14 NOTE — Progress Notes (Signed)
Patient ID: Caleb Thomas                 DOB: [birth year]                      MRN: [id number]     HPI: Caleb Thomas is a 60 y.o. male referred by Dr. Radford Pax to pharmacy clinic for HF medication management. PMH is significant for HTN, HLD, remote tobacco abuse, and ASCVD with anterior STEMI 04/2018. Cath showed severe single vessel CAD with occluded mid LAD and faint left-to-left collaterals with recommendation for medical therapy since symptom onset was 3-4 days prior and pt didn't have ongoing chest pain. EF was in the 25% range with LV dilatation and anteroapical severe hypokinesia. He was started on carvedilol, spironolactone, and ACEi. Follow up cardiac MRI showed lV thrombus so pt was started on warfarin and LifeVest was placed. Follow up 2D echo 10/2018 on max GDMT as BP tolerated showed persistent LV dysfunction with EF 25-30%. He was referred to EP and underwent AICD placement. Still has problems with exertional fatigue and DOE which have been present since his MI. At last visit with Dr Radford Pax on 3/2, lisinopril was changed to [facility], however he called clinic 2 days later unable to start [facility] due to cost as he is uninsured. He was started on losartan 25mg  daily instead.  Pt presents today in good spirits. Reports tolerating his medications well. Denies dizziness, balance issues, and swelling. Avoids adding salt to his food. Home weight 204 lbs earlier today. Recalls home BP 116/73. Pt does not have prescription drug insurance. His wife currently has a temp job but sounds as though this will be come a permanent job with benefits and pt would be added to her insurance plan at that time.  Current CHF meds:  carvedilol 12.5mg  BID losartan 25mg  daily spironolactone 25mg  daily  Previously tried: [employer] - cost prohibitive  BP goal: <130/49mmHg  Family History: Heart disease in his father; Hypertension in his brother and brother; Stroke in his brother and mother. There is no history of  Cancer  Social History: Former tobacco abuse. Occasional alcohol use.  Diet: Wife cooks at home and is diabetic, eats out occasionally. Doesn't fry food. Cooks with olive oil. Avoids adding salt to his food, uses Mrs Deliah Boston salt substitute.   Exercise: Walks outside or uses treadmill at home almost daily for 15 minutes.  Home BP readings:   Wt Readings from Last 3 Encounters:  05/05/20 213 lb (96.6 kg)  04/29/20 213 lb 12.8 oz (97 kg)  05/14/19 205 lb 12.8 oz (93.4 kg)   BP Readings from Last 3 Encounters:  05/05/20 134/72  04/29/20 136/78  05/14/19 130/84   Pulse Readings from Last 3 Encounters:  05/05/20 70  04/29/20 72  05/14/19 69    Renal function: Estimated Creatinine Clearance: 89.9 mL/min (by C-G formula based on SCr of 0.96 mg/dL).  Past Medical History:  Diagnosis Date  . CAD (coronary artery disease), native coronary artery 04/2018   s/p anterior STEMI with severe single-vessel CAD with occluded mid LAD and faint left-to-left collaterals.  . Hyperlipidemia   . Hypertension 2015  . Ischemic cardiomyopathy    EF 25-30% by echo 01/2019 s/p AICD implant    Current Outpatient Medications on File Prior to Visit  Medication Sig Dispense Refill  . aspirin 81 MG EC tablet Take 1 tablet (81 mg total) by mouth daily. 90 tablet 3  . atorvastatin (LIPITOR) 80 MG tablet  Take 1 tablet (80 mg total) by mouth daily. 90 tablet 0  . carvedilol (COREG) 12.5 MG tablet Take 1 tablet (12.5 mg total) by mouth 2 (two) times daily. 180 tablet 3  . clopidogrel (PLAVIX) 75 MG tablet TAKE 1 TABLET BY MOUTH DAILY WITH BREAKFAST 90 tablet 1  . losartan (COZAAR) 25 MG tablet Take 1 tablet (25 mg total) by mouth daily. 30 tablet 6  . spironolactone (ALDACTONE) 25 MG tablet Take 1 tablet (25 mg total) by mouth daily. 90 tablet 3  . warfarin (COUMADIN) 5 MG tablet TAKE AS DIRECTED BY  COUMADIN  CLINIC 35 tablet 3   No current facility-administered medications on file prior to visit.    No  Known Allergies   Assessment/Plan:  1. CHF - BP at goal <130/60mmHg with room to optimize CHF medications. Pt filled out [place], and [place] patient assistance forms in the office today (Jardiance and Wilder Glade have different income cutoffs so I had pt fill out both forms hoping that one SGLT2i will be covered by the manufacturer and have submitted the [place] application today, will hold on to [place] if pt is denied [place] assistance). He is aware that Delene Loll would replace his losartan and that either Jardiance or Wilder Glade would be add on therapy. In the mean time, will also change carvedilol to higher dose of Toprol 200mg  daily to target goal HR of 60 without compromising BP as much, which will hopefully allow for better Entresto titration if approved. He was provided with GoodRx coupon for Toprol. Continue losartan 25mg  daily for now until Delene Loll is approved, and continue spironolactone 25mg  daily. Pt advised to monitor BP and HR at home. Will schedule follow up appt once we hear back about pt med assistance.  Marshe Shrestha E. Hopie Pellegrin, PharmD, BCACP, [place] [address] [place], [place] [address] Phone: [phone]; Fax: [phone] 05/14/2020 11:44 AM

## 2020-05-18 NOTE — Progress Notes (Signed)
Remote ICD transmission.   

## 2020-05-26 ENCOUNTER — Telehealth: Payer: Self-pay | Admitting: Cardiology

## 2020-05-26 NOTE — Telephone Encounter (Signed)
New message:     Patient calling to state he received a letter for approval for [facility]

## 2020-05-26 NOTE — Telephone Encounter (Signed)
Called pt to discuss [facility] approval, we didn't receive approval fax in the office. He states Entresto 49-51mg  BID will be delivered on 3/31. I also called for an update on his Wilder Glade and he was approved for this on 3/25 however [employer] didn't let the pt or our office know. Stated it will take 1-2 weeks for the med to be delivered and they will reach out to pt. He is aware to start Entresto 24 hours after his last losartan dose. Scheduled f/u 2 weeks after Entresto start to recheck BMET and BP, pt has INR check the same day.

## 2020-06-04 ENCOUNTER — Other Ambulatory Visit: Payer: Self-pay | Admitting: Cardiology

## 2020-06-04 ENCOUNTER — Other Ambulatory Visit: Payer: Self-pay | Admitting: Internal Medicine

## 2020-06-16 ENCOUNTER — Ambulatory Visit (INDEPENDENT_AMBULATORY_CARE_PROVIDER_SITE_OTHER): Payer: Self-pay | Admitting: Pharmacist

## 2020-06-16 ENCOUNTER — Other Ambulatory Visit: Payer: Self-pay

## 2020-06-16 VITALS — BP 114/78 | HR 77 | Wt 208.0 lb

## 2020-06-16 DIAGNOSIS — Z5181 Encounter for therapeutic drug level monitoring: Secondary | ICD-10-CM

## 2020-06-16 DIAGNOSIS — I5022 Chronic systolic (congestive) heart failure: Secondary | ICD-10-CM

## 2020-06-16 DIAGNOSIS — I2102 ST elevation (STEMI) myocardial infarction involving left anterior descending coronary artery: Secondary | ICD-10-CM

## 2020-06-16 LAB — BASIC METABOLIC PANEL
BUN/Creatinine Ratio: 9 — ABNORMAL LOW (ref 10–24)
BUN: 12 mg/dL (ref 8–27)
CO2: 21 mmol/L (ref 20–29)
Calcium: 9.3 mg/dL (ref 8.6–10.2)
Chloride: 107 mmol/L — ABNORMAL HIGH (ref 96–106)
Creatinine, Ser: 1.28 mg/dL — ABNORMAL HIGH (ref 0.76–1.27)
Glucose: 99 mg/dL (ref 65–99)
Potassium: 4.3 mmol/L (ref 3.5–5.2)
Sodium: 141 mmol/L (ref 134–144)
eGFR: 63 mL/min/{1.73_m2} (ref >59)

## 2020-06-16 LAB — POCT INR: INR: 2.3 (ref 2.0–3.0)

## 2020-06-16 NOTE — Patient Instructions (Signed)
Description   - Continue on same dosage 1 tablet daily except 1/2 tablet on Sundays and Thursdays.  - Recheck INR in 6 weeks.  Coumadin Clinic [phone] [address] [phone]

## 2020-06-16 NOTE — Progress Notes (Signed)
Patient ID: Caleb Thomas                 DOB: [birth year]                      MRN: [id number]     HPI: Caleb Thomas is a 60 y.o. male referred by Dr. Radford Pax to pharmacy clinic for HF medication management. PMH is significant for HTN, HLD, remote tobacco abuse, and ASCVD with anterior STEMI 04/2018. Cath showed severe single vessel CAD with occluded mid LAD and faint left-to-left collaterals with recommendation for medical therapy since symptom onset was 3-4 days prior and pt didn't have ongoing chest pain. EF was in the 25% range with LV dilatation and anteroapical severe hypokinesia. He was started on carvedilol, spironolactone, and ACEi. Follow up cardiac MRI showed lV thrombus so pt was started on warfarin and LifeVest was placed. Follow up 2D echo 10/2018 on max GDMT as BP tolerated showed persistent LV dysfunction with EF 25-30%. He was referred to EP and underwent AICD placement. Still has problems with exertional fatigue and DOE which have been present since his MI. At last visit with Dr Radford Pax on 3/2, lisinopril was changed to [facility], however he called clinic 2 days later unable to start [facility] due to cost as he is uninsured. He was started on losartan 25mg  daily instead.  At last pharmacy visit on 05/14/20, carvedilol was changed to Toprol 200mg  daily for less BP effect in the hopes that other CHF meds could be titrated. Patient assistance forms were filled out and submitted for [place]. Pt has since been approved for both medications.  Pt presents today in good spirits. Reports starting Entresto 2-3 weeks ago and [place] about 1 week ago. Overall has been tolerating meds well but does report some balance issues that have improved the longer he's been on meds. Denies falls, headache, and swelling. Home BP has ranged 962-952 systolic. Home weight stable ~204 lbs. Does get up at night to use the restroom, thinks he may be taking his spironolactone in the evening. Takes most meds at 8am,  evening meds at 6pm.  Pt does not have prescription drug insurance. His wife currently has a temp job but sounds as though this will be come a permanent job with benefits and pt would be added to her insurance plan at that time.  Current CHF meds:  Entresto 49-51mg  BID (8am, 6pm) Farxiga 10mg  daily - 8am Toprol 200mg  daily - 8am Spironolactone 25mg  daily - 6pm  BP goal: <130/35mmHg  Family History: Heart disease in his father; Hypertension in his brother and brother; Stroke in his brother and mother. There is no history of Cancer  Social History: Former tobacco abuse. Occasional alcohol use.  Diet: Wife cooks at home and is diabetic, eats out occasionally. Doesn't fry food. Cooks with olive oil. Avoids adding salt to his food, uses Mrs Deliah Boston salt substitute.   Exercise: Walks outside or uses treadmill at home almost daily for 15 minutes.  Home BP readings: 107/78, mainly 105-115, HR down to mid 70s  Wt Readings from Last 3 Encounters:  05/14/20 208 lb (94.3 kg)  05/05/20 213 lb (96.6 kg)  04/29/20 213 lb 12.8 oz (97 kg)   BP Readings from Last 3 Encounters:  05/14/20 116/78  05/05/20 134/72  04/29/20 136/78   Pulse Readings from Last 3 Encounters:  05/14/20 74  05/05/20 70  04/29/20 72    Renal function: CrCl cannot be calculated (Patient's  most recent lab result is older than the maximum 21 days allowed.).  Past Medical History:  Diagnosis Date  . CAD (coronary artery disease), native coronary artery 04/2018   s/p anterior STEMI with severe single-vessel CAD with occluded mid LAD and faint left-to-left collaterals.  . Hyperlipidemia   . Hypertension 2015  . Ischemic cardiomyopathy    EF 25-30% by echo 01/2019 s/p AICD implant    Current Outpatient Medications on File Prior to Visit  Medication Sig Dispense Refill  . aspirin 81 MG EC tablet Take 1 tablet (81 mg total) by mouth daily. 90 tablet 3  . atorvastatin (LIPITOR) 80 MG tablet Take 1 tablet by mouth once  daily 90 tablet 3  . clopidogrel (PLAVIX) 75 MG tablet TAKE 1 TABLET BY MOUTH DAILY WITH BREAKFAST 90 tablet 1  . dapagliflozin propanediol (FARXIGA) 10 MG TABS tablet Take 10 mg by mouth daily.    . metoprolol (TOPROL XL) 200 MG 24 hr tablet Take 1 tablet (200 mg total) by mouth daily. 90 tablet 3  . sacubitril-valsartan (ENTRESTO) 49-51 MG Take 1 tablet by mouth 2 (two) times daily.    Marland Kitchen spironolactone (ALDACTONE) 25 MG tablet Take 1 tablet by mouth daily 90 tablet 3  . warfarin (COUMADIN) 5 MG tablet TAKE AS DIRECTED BY  COUMADIN  CLINIC 35 tablet 3   No current facility-administered medications on file prior to visit.    No Known Allergies   Assessment/Plan:  1. CHF - BP at goal <130/7mmHg with most home readings ranging 283-151 systolic. Checking BMET today with recent change from ARB to ARNI and SGLT2i start. Continue Toprol 200mg  daily, Entresto 49-51mg  BID, Farxiga 10mg  daily, and spironolactone 25mg  daily for CHF. Did advise him to move spironolactone to AM dosing to help prevent nighttime awakenings to use the bathroom, and to move Toprol to the evening to see if separating BP meds will improve his balance.  Pt approved for patient assistance for his branded CHF meds. In the future, can consider increasing Entresto dose, however since pt is noting occasional balance issues (also improving the longer he's been on meds), will continue on current dose for now. He will keep follow up with Dr Radford Pax next month as scheduled and can follow up with PharmD as needed.  Caleb Thomas, PharmD, BCACP, [place] [address] [place], [place] [address] Phone: [phone]; Fax: [phone] 06/16/2020 8:03 AM

## 2020-06-16 NOTE — Patient Instructions (Addendum)
Continue taking your current medications, but move your spironolactone to the morning, and move your metoprolol to the evening  Keep your follow up with Dr Radford Pax in a few weeks  Morning medications: - Entresto 49-51mg  - 1 tablet - Spironolactone 25mg  - 1 tablet - Farxiga 10mg  - 1 tablet - Aspirin 81mg  - 1 tablet - Clopidogrel 75mg  - 1 tablet  Evening medications: - Entresto 49-51mg  - 1 tablet - Metoprolol 200mg  - 1 tablet - Atorvastatin 80mg  - 1 tablet - Warfarin - as directed by clinic

## 2020-06-17 ENCOUNTER — Telehealth: Payer: Self-pay

## 2020-06-17 DIAGNOSIS — Z5181 Encounter for therapeutic drug level monitoring: Secondary | ICD-10-CM

## 2020-06-17 MED ORDER — SPIRONOLACTONE 25 MG PO TABS: 12.5000 mg | ORAL_TABLET | Freq: Every day | ORAL | 3 refills | Status: DC

## 2020-06-17 NOTE — Telephone Encounter (Signed)
The patient has been notified of the result and verbalized understanding.  All questions (if any) were answered. Antonieta Iba, RN 06/17/2020 3:29 PM

## 2020-06-26 ENCOUNTER — Other Ambulatory Visit: Payer: Self-pay

## 2020-06-26 ENCOUNTER — Other Ambulatory Visit: Payer: Self-pay | Admitting: *Deleted

## 2020-06-26 DIAGNOSIS — Z5181 Encounter for therapeutic drug level monitoring: Secondary | ICD-10-CM

## 2020-06-26 LAB — BASIC METABOLIC PANEL
BUN/Creatinine Ratio: 11 (ref 10–24)
BUN: 15 mg/dL (ref 8–27)
CO2: 23 mmol/L (ref 20–29)
Calcium: 9.1 mg/dL (ref 8.6–10.2)
Chloride: 104 mmol/L (ref 96–106)
Creatinine, Ser: 1.38 mg/dL — ABNORMAL HIGH (ref 0.76–1.27)
Glucose: 90 mg/dL (ref 65–99)
Potassium: 4.2 mmol/L (ref 3.5–5.2)
Sodium: 140 mmol/L (ref 134–144)
eGFR: 58 mL/min/{1.73_m2} — ABNORMAL LOW (ref >59)

## 2020-07-06 ENCOUNTER — Other Ambulatory Visit: Payer: Self-pay

## 2020-07-06 ENCOUNTER — Encounter: Payer: Self-pay | Admitting: Cardiology

## 2020-07-06 ENCOUNTER — Ambulatory Visit (INDEPENDENT_AMBULATORY_CARE_PROVIDER_SITE_OTHER): Payer: Medicaid Other | Admitting: Cardiology

## 2020-07-06 VITALS — BP 110/74 | HR 74 | Ht 68.0 in | Wt 207.4 lb

## 2020-07-06 DIAGNOSIS — I5022 Chronic systolic (congestive) heart failure: Secondary | ICD-10-CM

## 2020-07-06 DIAGNOSIS — I10 Essential (primary) hypertension: Secondary | ICD-10-CM

## 2020-07-06 DIAGNOSIS — I255 Ischemic cardiomyopathy: Secondary | ICD-10-CM

## 2020-07-06 DIAGNOSIS — I513 Intracardiac thrombosis, not elsewhere classified: Secondary | ICD-10-CM

## 2020-07-06 DIAGNOSIS — E7800 Pure hypercholesterolemia, unspecified: Secondary | ICD-10-CM

## 2020-07-06 DIAGNOSIS — I2510 Atherosclerotic heart disease of native coronary artery without angina pectoris: Secondary | ICD-10-CM

## 2020-07-06 LAB — COMPREHENSIVE METABOLIC PANEL
ALT: 30 IU/L (ref 0–44)
AST: 24 IU/L (ref 0–40)
Albumin/Globulin Ratio: 1.2 (ref 1.2–2.2)
Albumin: 4.2 g/dL (ref 3.8–4.8)
Alkaline Phosphatase: 68 IU/L (ref 44–121)
BUN/Creatinine Ratio: 10 (ref 10–24)
BUN: 11 mg/dL (ref 8–27)
Bilirubin Total: 0.5 mg/dL (ref 0.0–1.2)
CO2: 20 mmol/L (ref 20–29)
Calcium: 9.4 mg/dL (ref 8.6–10.2)
Chloride: 104 mmol/L (ref 96–106)
Creatinine, Ser: 1.12 mg/dL (ref 0.76–1.27)
Globulin, Total: 3.4 g/dL (ref 1.5–4.5)
Glucose: 82 mg/dL (ref 65–99)
Potassium: 4.4 mmol/L (ref 3.5–5.2)
Sodium: 137 mmol/L (ref 134–144)
Total Protein: 7.6 g/dL (ref 6.0–8.5)
eGFR: 74 mL/min/{1.73_m2} (ref >59)

## 2020-07-06 LAB — LIPID PANEL
Chol/HDL Ratio: 4.5 ratio (ref 0.0–5.0)
Cholesterol, Total: 125 mg/dL (ref 100–199)
HDL: 28 mg/dL — ABNORMAL LOW (ref >39)
LDL Chol Calc (NIH): 65 mg/dL (ref 0–99)
Triglycerides: 187 mg/dL — ABNORMAL HIGH (ref 0–149)
VLDL Cholesterol Cal: 32 mg/dL (ref 5–40)

## 2020-07-06 NOTE — Addendum Note (Signed)
Addended by: Antonieta Iba on: 07/06/2020 11:59 AM   Modules accepted: Orders

## 2020-07-06 NOTE — Patient Instructions (Signed)
Medication Instructions:  Your physician has recommended you make the following change in your medication:  1) STOP taking aspirin *If you need a refill on your cardiac medications before your next appointment, please call your pharmacy*   Lab Work: TODAY: CMET and FLP If you have labs (blood work) drawn today and your tests are completely normal, you will receive your results only by: Marland Kitchen MyChart Message (if you have MyChart) OR . A paper copy in the mail If you have any lab test that is abnormal or we need to change your treatment, we will call you to review the results.  Follow-Up: At [facility], you and your health needs are our priority.  As part of our continuing mission to provide you with exceptional heart care, we have created designated Provider Care Teams.  These Care Teams include your primary Cardiologist (physician) and Advanced Practice Providers (APPs -  Physician Assistants and Nurse Practitioners) who all work together to provide you with the care you need, when you need it.  Your next appointment:   6 month(s)  The format for your next appointment:   In Person  Provider:   You may see Fransico Him, MD or one of the following Advanced Practice Providers on your designated Care Team:    Melina Copa, PA-C  Ermalinda Barrios, PA-C

## 2020-07-06 NOTE — Progress Notes (Signed)
Cardiology Office Note:    Date:  07/06/2020   ID:  Binnie Rail, DOB [birth year], MRN [id number]  PCP:  Carlena Hurl, PA-C  Cardiologist:  Fransico Him, MD    Referring MD: Carlena Hurl, PA-C   Chief Complaint  Patient presents with  . Coronary Artery Disease  . Hypertension  . Hyperlipidemia  . Congestive Heart Failure  . Cardiomyopathy    History of Present Illness:    Caleb Thomas is a 60 y.o. male with a hx of hyperlipidemia, hypertension, remote history of tobacco and ASCAD with admission 04/2018 with Anterior STEMI. Cath showed severe single-vessel CAD with occluded mid LAD and faint left-to-left collaterals. Given onset of symptoms 3-4 days ago and lack of ongoing chest pain,  recommended medical therapy, including 12 months of dual antiplatelet therapy with aspirin and clopidogrel. EF in the 25% range with what appears to be LV dilatation and anteroapical severe hypokinesia. No signs of volume overload. Started on Carvedilol and spironolactone and eventually ACE I. Echo with contrastdid not show LV thrombus butfollow up cardiac MRI confirmed an LV thrombus and he was started on coumadin and LifeVest was placed.    He has chronic systolic dysfunction. Followup 2D echo on max GDMT as BP tolerated showed persistence of LV dysfunction with EF 25-30% .  He was referred to EP and underwent AICD placement for primary prevention in 01/2019 and is followed in Device clinic by [name].    He is here today for followup and is doing well.  He denies any chest pain or pressure, SOB, DOE, PND, orthopnea, LE edema, palpitations or syncope. He says that sometimes he gets choked when he eats.  He says that he had an episode of dizziness while he was picking up papers and bending over for at least 20 minutes but had no syncope.  He sat down and drank some gatorade and H2O and then sx resolved.  It was hot outside at the time.  He is compliant with his meds and is tolerating meds with no  SE.    Past Medical History:  Diagnosis Date  . CAD (coronary artery disease), native coronary artery 04/2018   s/p anterior STEMI with severe single-vessel CAD with occluded mid LAD and faint left-to-left collaterals.  . Chronic systolic (congestive) heart failure ([place])   . Hyperlipidemia   . Hypertension 2015  . Ischemic cardiomyopathy    EF 25-30% by echo 01/2019 s/p AICD implant  . LV (left ventricular) mural thrombus    noted on cardiac MRI but not on echo with contrast>>on chronic warfarin    Past Surgical History:  Procedure Laterality Date  . COLONOSCOPY  2013   "normal" per patient, [place]  . ICD IMPLANT N/A 02/07/2019   Procedure: ICD IMPLANT;  Surgeon: Evans Lance, MD;  Location: [place] [facility];  Service: Cardiovascular;  Laterality: N/A;  . LEFT HEART CATH AND CORONARY ANGIOGRAPHY N/A 05/06/2018   Procedure: LEFT HEART CATH AND CORONARY ANGIOGRAPHY;  Surgeon: [name], MD;  Location: [place] [facility];  Service: Cardiovascular;  Laterality: N/A;    Current Medications: Current Meds  Medication Sig  . aspirin 81 MG EC tablet Take 1 tablet (81 mg total) by mouth daily.  Marland Kitchen atorvastatin (LIPITOR) 80 MG tablet Take 1 tablet by mouth once daily  . clopidogrel (PLAVIX) 75 MG tablet TAKE 1 TABLET BY MOUTH DAILY WITH BREAKFAST  . dapagliflozin propanediol (FARXIGA) 10 MG TABS tablet Take 10 mg by mouth daily.  Marland Kitchen  metoprolol (TOPROL XL) 200 MG 24 hr tablet Take 1 tablet (200 mg total) by mouth daily.  . sacubitril-valsartan (ENTRESTO) 49-51 MG Take 1 tablet by mouth 2 (two) times daily.  Marland Kitchen spironolactone (ALDACTONE) 25 MG tablet Take 0.5 tablets (12.5 mg total) by mouth daily.  Marland Kitchen warfarin (COUMADIN) 5 MG tablet TAKE AS DIRECTED BY  COUMADIN  CLINIC (Patient taking differently: TAKE AS DIRECTED BY  COUMADIN  CLINIC)     Allergies:   Patient has no known allergies.   Social History   Socioeconomic History  . Marital status: Married    Spouse name: Not on  file  . Number of children: Not on file  . Years of education: Not on file  . Highest education level: Not on file  Occupational History  . Not on file  Tobacco Use  . Smoking status: Former Research scientist (life sciences)  . Smokeless tobacco: Never Used  . Tobacco comment: quit smoking 60 years old ago  Vaping Use  . Vaping Use: Never used  Substance and Sexual Activity  . Alcohol use: Yes    Alcohol/week: 3.0 standard drinks    Types: 3 Cans of beer per week    Comment: on weekend   . Drug use: Not on file  . Sexual activity: Not on file  Other Topics Concern  . Not on file  Social History Narrative   Married, 10 children, 4 grandchildren.   Surveyor, mining at [employer].   Exercise with walking on treadmill few days per week.   Member of [employer].    06/2017   Social Determinants of Health   Financial Resource Strain: Not on file  Food Insecurity: Not on file  Transportation Needs: Not on file  Physical Activity: Not on file  Stress: Not on file  Social Connections: Not on file     Family History: The patient's family history includes Heart disease in his father; Hypertension in his brother and brother; Stroke in his brother and mother. There is no history of Cancer.  ROS:   Please see the history of present illness.    ROS  All other systems reviewed and negative.   EKGs/Labs/Other Studies Reviewed:    The following studies were reviewed today:  2D echo 10/2018 IMPRESSIONS   1. The left ventricle has severely reduced systolic function, with an  ejection fraction of 25-30%. The cavity size was normal. Severe basal  septal hypertrophy. Left ventricular diastolic Doppler parameters are  consistent with pseudonormalization.  2. There is akinessis of the mid anteroseptal and inferoseptal, apical  and apical lateral walls. There is akinesis of the mid and apical  inferior, anterior and apical inferolateral walls. No evidence of apical  thrombus by definity contrast study.  3.  The right ventricle has normal systolic function. The cavity was  normal. There is no increase in right ventricular wall thickness. Right  ventricular systolic pressure is normal with an estimated pressure of 32.3  mmHg.  4. Left atrial size was mildly dilated.  5. The aortic valve is tricuspid. Moderate sclerosis of the aortic valve.  Aortic valve regurgitation is trivial by color flow Doppler.  6. The aorta is normal unless otherwise noted.   EKG:  EKG is not ordered today.  Recent Labs: 05/05/2020: Hemoglobin 15.4; Magnesium 2.0; Platelets 270 06/26/2020: BUN 15; Creatinine, Ser 1.38; Potassium 4.2; Sodium 140   Recent Lipid Panel    Component Value Date/Time   CHOL 142 05/07/2018 0654   CHOL 157 03/09/2018 1220  TRIG 198 (H) 05/07/2018 0654   HDL 15 (L) 05/07/2018 0654   HDL 35 (L) 03/09/2018 1220   CHOLHDL 9.5 05/07/2018 0654   VLDL 40 05/07/2018 0654   LDLCALC 87 05/07/2018 0654   LDLCALC 82 03/09/2018 1220   LDLCALC 85 03/06/2017 1138    Physical Exam:    VS:  BP 110/74   Pulse 74   Ht 5\' 8"  (1.727 m)   Wt 207 lb 6.4 oz (94.1 kg)   SpO2 98%   BMI 31.54 kg/m     Wt Readings from Last 3 Encounters:  07/06/20 207 lb 6.4 oz (94.1 kg)  06/16/20 208 lb (94.3 kg)  05/14/20 208 lb (94.3 kg)     GEN: Well nourished, well developed in no acute distress HEENT: Normal NECK: No JVD; No carotid bruits LYMPHATICS: No lymphadenopathy CARDIAC:RRR, no murmurs, rubs, gallops RESPIRATORY:  Clear to auscultation without rales, wheezing or rhonchi  ABDOMEN: Soft, non-tender, non-distended MUSCULOSKELETAL:  No edema; No deformity  SKIN: Warm and dry NEUROLOGIC:  Alert and oriented x 3 PSYCHIATRIC:  Normal affect    ASSESSMENT:    1. Coronary artery disease involving native coronary artery of native heart without angina pectoris   2. Ischemic cardiomyopathy   3. Chronic systolic heart failure ([place])   4. Essential hypertension   5. Pure hypercholesterolemia   6. LV  (left ventricular) mural thrombus    PLAN:    In order of problems listed above:  1.  ASCAD -s/p Anterior STEMI 04/2018 with cath showing severe single-vessel CAD with occluded mid LAD and faint left-to-left collaterals. Given onset of symptoms 3-4 days ago and lack of ongoing chest pain,  recommended medical therapy -he has not had any anginal symptoms of chest pain  -continue Plavix 75mg  daily, statin and BB -stop ASA as he is on Plavix and warfarin (for LV thrombus)  2.  Ischemic DCM -EF 25-30% by echo 01/2019 s/p AICD for primary prevention -try to maximize GDMT was much as BP allows -see #3  3.  Chronic systolic CHF -last EF assessment 25-30% -s/p AICD followed in device clinic by EP -he did not appear volume overloaded on last OV  but his weight had trended 8lbs upward  year and he was having DOE which was felt to be related to obesity and chronic low output heart failure -his ACE I was stopped and he was started on Entresto 24-26mg  BID  -he was seen by Sherian Rein D I April and Entresto was increased to 49-51mg  BID and [place] 10mg  daily was added -he is feeling good today and has not had any further SOB -cannot increase BB or Entresto further as BP is soft today and he has hx of balance issues in the past -will continue on Entresto 49-51mg  BID, Toprol XL 200mg  daily, Arlyce Harman 25mg  daily -SCr have bumped last month after increased dose of Entresto  -will repeat BMET today  4.  HTN -BP is well controlled today on exam -continue spiro 25mg  daily, Toprol XL 200mg  daily and Entresto 49-51mg  BID  5.  HLD -LDL goal < 70 -LDL was 87 in March 2020 -repeat FLp and ALT -continue atorvastatin 80mg  daily  6. LV thrombus -found to have LV thrombus on Cardiac MRI - not noted on echo with contrast and is on warfarin -INR followed in coumadin clinic   Medication Adjustments/Labs and Tests Ordered: Current medicines are reviewed at length with the patient today.  Concerns regarding  medicines are outlined above.  No orders  of the defined types were placed in this encounter.  No orders of the defined types were placed in this encounter.  Followup with me in 6 months  Signed, Fransico Him, MD  07/06/2020 11:45 AM    [place]

## 2020-07-11 ENCOUNTER — Other Ambulatory Visit: Payer: Self-pay | Admitting: Cardiology

## 2020-07-28 ENCOUNTER — Other Ambulatory Visit: Payer: Self-pay

## 2020-07-28 ENCOUNTER — Ambulatory Visit (INDEPENDENT_AMBULATORY_CARE_PROVIDER_SITE_OTHER): Payer: Self-pay

## 2020-07-28 DIAGNOSIS — I2102 ST elevation (STEMI) myocardial infarction involving left anterior descending coronary artery: Secondary | ICD-10-CM

## 2020-07-28 DIAGNOSIS — Z5181 Encounter for therapeutic drug level monitoring: Secondary | ICD-10-CM

## 2020-07-28 LAB — POCT INR: INR: 2.3 (ref 2.0–3.0)

## 2020-07-28 NOTE — Patient Instructions (Signed)
-   Continue on same dosage 1 tablet daily except 1/2 tablet on Sundays and Thursdays.  - Recheck INR in 6 weeks.  [facility] [phone] Main [phone]

## 2020-08-10 ENCOUNTER — Ambulatory Visit (INDEPENDENT_AMBULATORY_CARE_PROVIDER_SITE_OTHER): Payer: Self-pay

## 2020-08-10 DIAGNOSIS — I255 Ischemic cardiomyopathy: Secondary | ICD-10-CM

## 2020-08-11 LAB — CUP PACEART REMOTE DEVICE CHECK
Date Time Interrogation Session: 20220614072606
Implantable Lead Implant Date: 20201210
Implantable Lead Location: 753860
Implantable Lead Model: 436909
Implantable Lead Serial Number: 8102423
Implantable Pulse Generator Implant Date: 20201210
Pulse Gen Model: 429525
Pulse Gen Serial Number: 84745362

## 2020-09-01 NOTE — Progress Notes (Addendum)
Remote ICD transmission.   

## 2020-09-08 ENCOUNTER — Other Ambulatory Visit: Payer: Self-pay

## 2020-09-08 ENCOUNTER — Ambulatory Visit (INDEPENDENT_AMBULATORY_CARE_PROVIDER_SITE_OTHER): Payer: Self-pay | Admitting: *Deleted

## 2020-09-08 DIAGNOSIS — I2102 ST elevation (STEMI) myocardial infarction involving left anterior descending coronary artery: Secondary | ICD-10-CM

## 2020-09-08 DIAGNOSIS — Z5181 Encounter for therapeutic drug level monitoring: Secondary | ICD-10-CM

## 2020-09-08 LAB — POCT INR: INR: 2.5 (ref 2.0–3.0)

## 2020-09-08 NOTE — Patient Instructions (Signed)
Description   Continue taking 1 tablet daily except 1/2 tablet on Sundays and Thursdays. Recheck INR in 6 weeks. [facility] [phone] Main [phone]

## 2020-10-20 ENCOUNTER — Other Ambulatory Visit: Payer: Self-pay

## 2020-10-20 ENCOUNTER — Ambulatory Visit (INDEPENDENT_AMBULATORY_CARE_PROVIDER_SITE_OTHER): Payer: Self-pay | Admitting: *Deleted

## 2020-10-20 DIAGNOSIS — I2102 ST elevation (STEMI) myocardial infarction involving left anterior descending coronary artery: Secondary | ICD-10-CM

## 2020-10-20 DIAGNOSIS — Z5181 Encounter for therapeutic drug level monitoring: Secondary | ICD-10-CM

## 2020-10-20 LAB — POCT INR: INR: 2.5 (ref 2.0–3.0)

## 2020-10-20 NOTE — Patient Instructions (Signed)
Description   Continue taking 1 tablet daily except 1/2 tablet on Sundays and Thursdays. Recheck INR in 6 weeks. [facility] [phone] Main [phone]

## 2020-10-30 ENCOUNTER — Other Ambulatory Visit: Payer: Self-pay | Admitting: Cardiology

## 2020-11-09 ENCOUNTER — Ambulatory Visit (INDEPENDENT_AMBULATORY_CARE_PROVIDER_SITE_OTHER): Payer: Medicare HMO

## 2020-11-09 DIAGNOSIS — I255 Ischemic cardiomyopathy: Secondary | ICD-10-CM

## 2020-11-09 DIAGNOSIS — Z95810 Presence of automatic (implantable) cardiac defibrillator: Secondary | ICD-10-CM | POA: Diagnosis not present

## 2020-11-09 DIAGNOSIS — I5022 Chronic systolic (congestive) heart failure: Secondary | ICD-10-CM

## 2020-11-10 LAB — CUP PACEART REMOTE DEVICE CHECK
Date Time Interrogation Session: 20220912105514
Implantable Lead Implant Date: 20201210
Implantable Lead Location: 753860
Implantable Lead Model: 436909
Implantable Lead Serial Number: 8102423
Implantable Pulse Generator Implant Date: 20201210
Pulse Gen Model: 429525
Pulse Gen Serial Number: 84745362

## 2020-11-16 ENCOUNTER — Telehealth: Payer: Self-pay

## 2020-11-16 NOTE — Telephone Encounter (Addendum)
Patient returning call. Unaware of slow non-sustained tachycardia. States he was just leaving church at time of episode. Did admit to a warmer room temperature during service. Medications reviewed. Patient compliant with all medications and dosages including plavix farxiga, metoprolol, entresto, aldactone, and coumadin. Will forward to [name] for review but anticipate watchful waiting. NSVT history documented. ED precautions reviewed.

## 2020-11-16 NOTE — Telephone Encounter (Signed)
[employer] alert received for "spontaneous Slow nsT episode detected 11/15/20 at 5:17 pm. Unsuccessful telephone encounter to patient to follow up on s/s and medication compliance as patient has a history of ICM and chronic systolic HF. Hipaa compliant VM message left requesting call back to [phone].

## 2020-11-17 NOTE — Progress Notes (Signed)
Remote ICD transmission.   

## 2020-11-24 ENCOUNTER — Encounter: Payer: Self-pay | Admitting: Medical

## 2020-11-24 ENCOUNTER — Other Ambulatory Visit: Payer: Self-pay

## 2020-11-24 ENCOUNTER — Ambulatory Visit (INDEPENDENT_AMBULATORY_CARE_PROVIDER_SITE_OTHER): Payer: Medicare HMO | Admitting: Medical

## 2020-11-24 VITALS — BP 122/86 | HR 61 | Ht 68.5 in | Wt 203.2 lb

## 2020-11-24 DIAGNOSIS — R7301 Impaired fasting glucose: Secondary | ICD-10-CM | POA: Insufficient documentation

## 2020-11-24 DIAGNOSIS — R1310 Dysphagia, unspecified: Secondary | ICD-10-CM | POA: Diagnosis not present

## 2020-11-24 DIAGNOSIS — Z7901 Long term (current) use of anticoagulants: Secondary | ICD-10-CM | POA: Diagnosis not present

## 2020-11-24 DIAGNOSIS — Z95810 Presence of automatic (implantable) cardiac defibrillator: Secondary | ICD-10-CM | POA: Diagnosis not present

## 2020-11-24 DIAGNOSIS — I2510 Atherosclerotic heart disease of native coronary artery without angina pectoris: Secondary | ICD-10-CM

## 2020-11-24 DIAGNOSIS — I5022 Chronic systolic (congestive) heart failure: Secondary | ICD-10-CM

## 2020-11-24 DIAGNOSIS — I10 Essential (primary) hypertension: Secondary | ICD-10-CM | POA: Diagnosis not present

## 2020-11-24 DIAGNOSIS — Z125 Encounter for screening for malignant neoplasm of prostate: Secondary | ICD-10-CM

## 2020-11-24 DIAGNOSIS — E7800 Pure hypercholesterolemia, unspecified: Secondary | ICD-10-CM

## 2020-11-24 DIAGNOSIS — Z1211 Encounter for screening for malignant neoplasm of colon: Secondary | ICD-10-CM

## 2020-11-24 LAB — POCT GLYCOSYLATED HEMOGLOBIN (HGB A1C): Hemoglobin A1C: 5.9 % — AB (ref 4.0–5.6)

## 2020-11-24 NOTE — Progress Notes (Signed)
Subjective:  Caleb Thomas is a 60 y.o. male who presents for Chief Complaint  Patient presents with   Hypertension   Hyperlipidemia   Dysphagia    When eating for 1 month has difficulty swallowing meats     Here for med check  Medical team: Dr. Fransico Him, cardiology Dr. Cristopher Peru, electrophysiology  Last visit here 2020.  Lots of things have happened with his medical issues since last visit here including heart attack, debrillator, heart failure.    Feels like he sometimes chokes when he eats.  Been having this problem for a few months.   Food seems to get stuck.  Has had times where he vomits the food back up.  Meat and steak is the worse.  Doesn't get choked on liquids.  No prior esophageal dilatation. No self or family history of esophageal stricture.     He has been prediabetic in the past.  No polyuria, no polydipsia, no weight change or blurred vision.  He is not using a whole lot of discretion with diet.  He still eats steak, ice cream and junk food amongst other food  Otherwise has been feeling pretty good  He declines vaccines today  No other aggravating or relieving factors.    No other c/o.  Past Medical History:  Diagnosis Date   CAD (coronary artery disease), native coronary artery 04/2018   s/p anterior STEMI with severe single-vessel CAD with occluded mid LAD and faint left-to-left collaterals.   Chronic systolic (congestive) heart failure (HCC)    Hyperlipidemia    Hypertension 2015   Ischemic cardiomyopathy    EF 25-30% by echo 01/2019 s/p AICD implant   LV (left ventricular) mural thrombus    noted on cardiac MRI but not on echo with contrast>>on chronic warfarin   Current Outpatient Medications on File Prior to Visit  Medication Sig Dispense Refill   atorvastatin (LIPITOR) 80 MG tablet Take 1 tablet by mouth once daily 90 tablet 3   clopidogrel (PLAVIX) 75 MG tablet Take 1 tablet by mouth once daily with breakfast 90 tablet 2   dapagliflozin  propanediol (FARXIGA) 10 MG TABS tablet Take 10 mg by mouth daily.     metoprolol (TOPROL XL) 200 MG 24 hr tablet Take 1 tablet (200 mg total) by mouth daily. 90 tablet 3   sacubitril-valsartan (ENTRESTO) 49-51 MG Take 1 tablet by mouth 2 (two) times daily.     spironolactone (ALDACTONE) 25 MG tablet Take 0.5 tablets (12.5 mg total) by mouth daily. 45 tablet 3   warfarin (COUMADIN) 5 MG tablet TAKE AS DIRECTED BY  COUMADIN  CLINIC 35 tablet 3   No current facility-administered medications on file prior to visit.     The following portions of the patient's history were reviewed and updated as appropriate: allergies, current medications, past family history, past medical history, past social history, past surgical history and problem list.  ROS Otherwise as in subjective above  Objective: BP 122/86   Pulse 61   Ht 5' 8.5" (1.74 m)   Wt 203 lb 3.2 oz (92.2 kg)   SpO2 97%   BMI 30.45 kg/m   General appearance: alert, no distress, well developed, well nourished Neck: supple, no lymphadenopathy, no thyromegaly, no masses Heart: RRR, normal S1, S2, no murmurs Lungs: CTA bilaterally, no wheezes, rhonchi, or rales Abdomen: +bs, soft, non tender, non distended, no masses, no hepatomegaly, no splenomegaly Pulses: 2+ radial pulses, 1+ pedal pulses, normal cap refill Ext: no edema  Assessment: Encounter Diagnoses  Name Primary?   Impaired fasting glucose Yes   Problems with swallowing    Screening for prostate cancer    Essential hypertension    Pure hypercholesterolemia    Coronary artery disease involving native coronary artery of native heart without angina pectoris    Chronic systolic (congestive) heart failure (HCC)    ICD (implantable cardioverter-defibrillator) in place    Anticoagulated    Screening for colon cancer      Plan: Impaired glucose-we discussed that he is at risk for diabetes.  We discussed improving on diet, cutting out sweets and junk food, getting  regular exercise.  He is on [place] per cardiology but this is helping control blood sugars as well  Swallowing difficulties-referral with swallow study and likely referral to gastroenterology.  For now chew food thoroughly, use small portions and avoid thick chunky pieces of meat  Hypertension, CAD, chronic heart failure, ICD-most of his care has been managed by cardiology of late.  All of his medicines currently are being prescribed by cardiology.  I reviewed some cardiac notes from this past year which was quite eventful from a cardiac standpoint for him.  He is on Coumadin per cardiology  PSA lab today for screening  We discussed that if he ends up seeing gastroenterology he will likely have an updated colonoscopy/EGD in the near future  Work on [place] was seen today for hypertension, hyperlipidemia and dysphagia.  Diagnoses and all orders for this visit:  Impaired fasting glucose -     HgB A1c  Problems with swallowing -     DG SWALLOW STUDY OP MEDICARE SPEECH PATH; Future  Screening for prostate cancer -     PSA  Essential hypertension  Pure hypercholesterolemia  Coronary artery disease involving native coronary artery of native heart without angina pectoris  Chronic systolic (congestive) heart failure (HCC)  ICD (implantable cardioverter-defibrillator) in place  Anticoagulated  Screening for colon cancer   Follow up: pending labs, swallow study

## 2020-11-24 NOTE — Patient Instructions (Signed)
Dietary recommendations:  Breakfast You may eat 1 of the following Smoothie with Almond milk, handful of kale or spinach, and 1-2 fruit servings of your choice such as berries or 1/2 banana Whole grain slice of toast and thin layer of low sugar jam or small amount of honey Whole grain slice of toast and avocado spread 1/2 cup of steel cut oats (oatmeal)   Mid-morning snack 1 fruit serving such as one of the following: medium-sized apple medium-sized orange, Tangerine 1/2 banana  3/4 cup of fresh berries or frozen berries  A protein source such as one of the following: 8 almonds  small handful of walnuts or other nuts Hummus and vegetable such as carrots   Lunch A protein source such as 1 of the following: 1 serving of beans such as black beans, pinto beans, green beans, or edamame (soy beans) Veggie burger  Non breaded fish such as salmon or tuna, either baked, grilled, or broiled Vegetable - Half of your plate should be a non-starchy vegetables!  So avoid white potatoes and corn.  Otherwise, eat a large portion of vegetables. Avocado, cucumber, tomato, carrots, greens, lettuce, squash, okra, etc.  Vegetables can include salad with olive oil/vinaigrette dressing Grains such as 1/2 cup of brown rice, quinoa, barley or other whole grain or 1 or 2 slices of whole grain bread   Mid-afternoon snack 1 fruit serving such as one of the following: medium-sized apple medium-sized orange, Tangerine 1/2 banana  3/4 cup of fresh berries or frozen berries  A protein source such as one of the following: 8 almonds  small handful of walnuts or other nuts Hummus and vegetable such as carrots   Dinner A protein source such as 1 of the following: 1 serving of beans such as black beans, pinto beans, green beans, or edamame (soy beans) Veggie burger  Non breaded fish such as salmon or tuna, either baked, grilled, or broiled Vegetable - Half of your plate should be a non-starchy  vegetables!  So avoid white potatoes and corn.  Otherwise, eat a large portion of vegetables. Avocado, cucumber, tomato, carrots, greens, lettuce, squash, okra, etc.  Vegetables can include salad with olive oil/vinaigrette dressing Grains such as 1/2 cup of brown rice, quinoa, barley or other whole grain or 1 or 2 slices of whole grain bread   Beverages: Water Unsweet tea Home made juice with a juicer without sugar added other than small bit of honey or agave nectar Water with sugar free flavor such as [place]   AVOID.... For the time being I want you to cut out the following items completely: Soda, sweet tea, juice, beer or wine or alcohol Sweets such as cake, candy, pies, chips, cookies, chocolate

## 2020-11-25 LAB — PSA: Prostate Specific Ag, Serum: 0.9 ng/mL (ref 0.0–4.0)

## 2020-11-25 NOTE — Addendum Note (Signed)
Addended by: Minette Headland A on: 11/25/2020 02:42 PM   Modules accepted: Orders

## 2020-11-26 ENCOUNTER — Telehealth (HOSPITAL_COMMUNITY): Payer: Self-pay

## 2020-11-26 ENCOUNTER — Other Ambulatory Visit (HOSPITAL_COMMUNITY): Payer: Self-pay

## 2020-11-26 DIAGNOSIS — R1310 Dysphagia, unspecified: Secondary | ICD-10-CM

## 2020-11-26 NOTE — Telephone Encounter (Signed)
Attempted to contact patient to schedule OP MBS - left voicemail. ?

## 2020-12-01 ENCOUNTER — Other Ambulatory Visit: Payer: Self-pay

## 2020-12-01 ENCOUNTER — Ambulatory Visit (INDEPENDENT_AMBULATORY_CARE_PROVIDER_SITE_OTHER): Payer: Medicare HMO | Admitting: *Deleted

## 2020-12-01 DIAGNOSIS — I2102 ST elevation (STEMI) myocardial infarction involving left anterior descending coronary artery: Secondary | ICD-10-CM

## 2020-12-01 DIAGNOSIS — Z5181 Encounter for therapeutic drug level monitoring: Secondary | ICD-10-CM | POA: Diagnosis not present

## 2020-12-01 LAB — POCT INR: INR: 2.6 (ref 2.0–3.0)

## 2020-12-01 NOTE — Patient Instructions (Signed)
Description   Continue taking 1 tablet daily except 1/2 tablet on Sundays and Thursdays. Recheck INR in 6 weeks. [facility] [phone] Main [phone]

## 2020-12-02 ENCOUNTER — Encounter (HOSPITAL_COMMUNITY): Payer: Self-pay

## 2020-12-02 ENCOUNTER — Ambulatory Visit (HOSPITAL_COMMUNITY)
Admission: RE | Admit: 2020-12-02 | Discharge: 2020-12-02 | Disposition: A | Discharge status: Home / Self Care | Payer: Medicare HMO | Source: Ambulatory Visit | Attending: Medical | Admitting: Medical

## 2020-12-02 ENCOUNTER — Other Ambulatory Visit (HOSPITAL_COMMUNITY): Payer: Self-pay | Admitting: Medical

## 2020-12-02 ENCOUNTER — Other Ambulatory Visit: Payer: Self-pay | Admitting: Internal Medicine

## 2020-12-02 DIAGNOSIS — R1310 Dysphagia, unspecified: Secondary | ICD-10-CM

## 2020-12-04 ENCOUNTER — Other Ambulatory Visit: Payer: Self-pay

## 2020-12-04 ENCOUNTER — Other Ambulatory Visit: Payer: Self-pay | Admitting: Internal Medicine

## 2020-12-04 ENCOUNTER — Ambulatory Visit (HOSPITAL_COMMUNITY)
Admission: RE | Admit: 2020-12-04 | Discharge: 2020-12-04 | Disposition: A | Discharge status: Home / Self Care | Payer: Medicare HMO | Source: Ambulatory Visit | Attending: Medical | Admitting: Medical

## 2020-12-04 DIAGNOSIS — R933 Abnormal findings on diagnostic imaging of other parts of digestive tract: Secondary | ICD-10-CM

## 2020-12-04 DIAGNOSIS — R1310 Dysphagia, unspecified: Secondary | ICD-10-CM

## 2020-12-04 DIAGNOSIS — K222 Esophageal obstruction: Secondary | ICD-10-CM | POA: Diagnosis not present

## 2020-12-14 ENCOUNTER — Telehealth: Payer: Self-pay | Admitting: Cardiology

## 2020-12-14 NOTE — Telephone Encounter (Signed)
Pt is calling regarding getting assistance with Entresto 49-51 and  Farxiga 10 mg. Pt states he spoke with someone but he does not remember when or who he spoke too!. Please advise pt further

## 2020-12-15 MED ORDER — ENTRESTO 49-51 MG PO TABS: 1.0000 | ORAL_TABLET | Freq: Two times a day (BID) | ORAL | 3 refills | Status: DC

## 2020-12-15 MED ORDER — DAPAGLIFLOZIN PROPANEDIOL 10 MG PO TABS: 10.0000 mg | ORAL_TABLET | Freq: Every day | ORAL | 3 refills | Status: DC

## 2020-12-15 NOTE — Telephone Encounter (Signed)
**Note De-Identified Aiken Withem Obfuscation** The pt states that one of the pharmacist at Dr Landis Gandy office helped him apply for asst for Entresto through [employer] pt asst foundation and for [place] through [employer] and [place] and he was approved for both. He states that he has s/w each foundation and that they both need a new prescription sent in for his medications.  I have e-scribed his Entresto 49-51 mg #180 with 3 refills to [employer] Emergency planning/management officer for [employer] pt asst Foundation) and his Wilder Glade 10mg  #90 with 3 refills to [employer] Emergency planning/management officer for [employer] and [place]) as requested.

## 2020-12-23 ENCOUNTER — Telehealth: Payer: Self-pay

## 2020-12-23 DIAGNOSIS — I499 Cardiac arrhythmia, unspecified: Secondary | ICD-10-CM | POA: Diagnosis not present

## 2020-12-23 DIAGNOSIS — R1310 Dysphagia, unspecified: Secondary | ICD-10-CM | POA: Diagnosis not present

## 2020-12-23 DIAGNOSIS — K222 Esophageal obstruction: Secondary | ICD-10-CM | POA: Diagnosis not present

## 2020-12-23 NOTE — Telephone Encounter (Signed)
   [facility] Pre-operative Risk Assessment    Patient Name: Caleb Thomas  DOB: September 03, 1960 MRN: [id number]  Request for surgical clearance:  What type of surgery is being performed? EGD and Colonoscopy  When is this surgery scheduled? 02/26/2021  What type of clearance is required (medical clearance vs. Pharmacy clearance to hold med vs. Both)? Both  Are there any medications that need to be held prior to surgery and how long? Warfarin and Plavix   Practice name and name of physician performing surgery? [facility], [place] - Dr Benson Norway  What is the office phone number? [phone]   7.   What is the office fax number? [phone]  8.   Anesthesia type (None, local, MAC, general) ? Propofol   Shaneece Stockburger 12/23/2020, 5:00 PM  _________________________________________________________________   (provider comments below)

## 2020-12-25 NOTE — Telephone Encounter (Signed)
Patient with diagnosis of LV thrombus on warfarin for anticoagulation.    Procedure:  EGD and Colonoscopy Date of procedure: 02/26/21  Per office protocol, patient can hold warfarin for 5 days prior to procedure.    LV thrombus found >2 years ago. Patient has been on warfarin. No repeat MRI. I will defer to Dr. Radford Pax if bridge is needed.

## 2020-12-25 NOTE — Telephone Encounter (Signed)
   Name: Caleb Thomas  DOB: 08-Jul-1960  MRN: [id number]   Primary Cardiologist: Fransico Him, MD  Chart reviewed as part of pre-operative protocol coverage. Patient was contacted 12/25/2020 in reference to pre-operative risk assessment for pending surgery as outlined below.  Callan Norden was last seen on 07/06/2020 by Dr. Radford Pax.  Since that day, Trek Kimball has done okay from a cardiac standpoint. He is chronically limited in activity by fatigue which has been unchanged if not better over the past several months. He is unable to complete 4 METs, though has no chest pain complaints with his limited activity. Overall he states things are stable, if not improved over the past year.   Given limited activity, will route to Dr. Radford Pax for input on his preop risk.   Per pharmacy recommendations, patient can hold Coumadin 5 days prior to his upcoming procedure though, per Dr. Radford Pax, will require Lovenox bridging.  This will be coordinated by our Coumadin clinic.  Once input from Dr. Radford Pax received, will finalize recommendations for the gastroenterology team.  Abigail Butts, PA-C 12/25/2020, 3:46 PM

## 2020-12-28 NOTE — Telephone Encounter (Signed)
   Name:  Holman Bonsignore  DOB:  1960-03-10  MRN:  [id number]   Primary Cardiologist: Fransico Him, MD  Chart reviewed as part of pre-operative protocol coverage. As recommended by Dr. Radford Pax, Binnie Rail will require a follow-up visit for further pre-operative risk assessment.  Pre-op covering staff: - Please schedule appointment and call patient to inform them. Please add "pre-op clearance" to the appointment notes so provider is aware. - Please contact requesting surgeon's office via preferred method (i.e, phone, fax) to inform them of need for appointment prior to surgery.  Abigail Butts, PA-C 12/28/2020, 8:25 AM

## 2020-12-28 NOTE — Progress Notes (Deleted)
Cardiology Office Note    Date:  12/28/2020   ID:  Caleb Thomas, DOB 1960-12-07, MRN [id number]  PCP:  Caleb Hurl, PA-C  Cardiologist:  Caleb Him, MD  Electrophysiologist:  None   Chief Complaint: ***  History of Present Illness:   Caleb Thomas is a 60 y.o. male with history of HTN, HLD, CAD with anterior STEMI 04/2018 with occluded LAD, ischemic cardiomyopathy, chronic systolic CHF, LV thrombus who presents for follow-up. At time of MI in 04/2018, he underwent cath showing occluded mid LAD and faint left-to-left collaterals, also residual disease as outlined below. Given onset of symptoms 3-4 days ago and lack of ongoing chest pain,  recommended medical therapy, including 12 months of dual antiplatelet therapy with aspirin and clopidogrel. EF was 25% range and he was placed on GDMT and Lifevest. Echo did not show LV thrombus but cardiac MRI did so he hwas placed on warfarin. He ultimately required ICD implantation 01/2019 given persistent LV dysfunction. Last echo 10/2018 EF 25-30%, severe basal septal hypertrophy, + WMA as outlined, normal RV function, mild LAE, mild aortic sclerosis without stenosis. At last OV 06/2020 his ASA was stopped and he was continued on Plavix and warfarin. Last device check 10/2020 stable. We recently received pre-op clearance request for EGD & colonoscopy in December and he was contacted for an appointment.   Plavix/warafarin  Pre-op cardiovascular examination CAD with HLD goal LDL <70 Ischemic cardiomyopathy/chronic systolic CHF Essential HTN   Labwork independently reviewed: 06/2020 LDL 65, trig 187, CMET ok Cr 1.12 04/2020 CBC wnl  Past Medical History:  Diagnosis Date   CAD (coronary artery disease), native coronary artery 04/2018   s/p anterior STEMI with severe single-vessel CAD with occluded mid LAD and faint left-to-left collaterals.   Chronic systolic (congestive) heart failure (HCC)    Hyperlipidemia    Hypertension 2015   Ischemic  cardiomyopathy    EF 25-30% by echo 01/2019 s/p AICD implant   LV (left ventricular) mural thrombus    noted on cardiac MRI but not on echo with contrast>>on chronic warfarin    Past Surgical History:  Procedure Laterality Date   COLONOSCOPY  2013   "normal" per patient, [place]   ICD IMPLANT N/A 02/07/2019   Procedure: ICD IMPLANT;  Surgeon: Evans Lance, MD;  Location: [facility];  Service: Cardiovascular;  Laterality: N/A;   LEFT HEART CATH AND CORONARY ANGIOGRAPHY N/A 05/06/2018   Procedure: LEFT HEART CATH AND CORONARY ANGIOGRAPHY;  Surgeon: [name], MD;  Location: [facility];  Service: Cardiovascular;  Laterality: N/A;    Current Medications: No outpatient medications have been marked as taking for the 12/29/20 encounter (Appointment) with Caleb Pitter, PA-C.   ***   Allergies:   Patient has no known allergies.   Social History   Socioeconomic History   Marital status: Married    Spouse name: Not on file   Number of children: Not on file   Years of education: Not on file   Highest education level: Not on file  Occupational History   Not on file  Tobacco Use   Smoking status: Former   Smokeless tobacco: Never   Tobacco comments:    quit smoking 60 years old ago  Vaping Use   Vaping Use: Never used  Substance and Sexual Activity   Alcohol use: Yes    Alcohol/week: 3.0 standard drinks    Types: 3 Cans of beer per week    Comment: on weekend  Drug use: Not on file   Sexual activity: Not on file  Other Topics Concern   Not on file  Social History Narrative   Married, 10 children, 4 grandchildren.   Surveyor, mining at [employer].   Exercise with walking on treadmill few days per week.   Member of [employer].    06/2017   Social Determinants of Health   Financial Resource Strain: Not on file  Food Insecurity: Not on file  Transportation Needs: Not on file  Physical Activity: Not on file  Stress: Not on file  Social  Connections: Not on file     Family History:  The patient's ***family history includes Heart disease in his father; Hypertension in his brother and brother; Stroke in his brother and mother. There is no history of Cancer.  ROS:   Please see the history of present illness. Otherwise, review of systems is positive for ***.  All other systems are reviewed and otherwise negative.    EKGs/Labs/Other Studies Reviewed:    Studies reviewed are outlined and summarized above. Reports included below if pertinent.  2D echo 10/2018  1. The left ventricle has severely reduced systolic function, with an  ejection fraction of 25-30%. The cavity size was normal. Severe basal  septal hypertrophy. Left ventricular diastolic Doppler parameters are  consistent with pseudonormalization.   2. There is akinessis of the mid anteroseptal and inferoseptal, apical  and apical lateral walls. There is akinesis of the mid and apical  inferior, anterior and apical inferolateral walls. No evidence of apical  thrombus by definity contrast study.   3. The right ventricle has normal systolic function. The cavity was  normal. There is no increase in right ventricular wall thickness. Right  ventricular systolic pressure is normal with an estimated pressure of 32.3  mmHg.   4. Left atrial size was mildly dilated.   5. The aortic valve is tricuspid. Moderate sclerosis of the aortic valve.  Aortic valve regurgitation is trivial by color flow Doppler.   6. The aorta is normal unless otherwise noted.   Cath 04/2018 Severe single-vessel CAD with occluded mid LAD and faint left-to-left collaterals. Severely reduced LVEF with anterior and apical akinesis; question LV apical thrombus. Mildly elevated left ventricular filling pressure (LVEDP 15-20 mmHg).   Recommendations: Given onset of symptoms 3-4 days ago and lack of ongoing chest pain, I recommend medical therapy, including 12 months of dual antiplatelet therapy with  aspirin and clopidogrel. Obtain echo with Definity to assess for LV apical thrombus.  I will restart heparin infusion 2 hours after TR band deflation pending echo results. Aggressive secondary prevention. Given mildly elevated LVEDP and acute kidney injury, maintain net even fluid balance.  May need to consider gentle diuresis if dyspnea develops.   [name], MD [facility] Pager: [phone]      EKG:  EKG is ordered today, personally reviewed, demonstrating ***  Recent Labs: 05/05/2020: Hemoglobin 15.4; Magnesium 2.0; Platelets 270 07/06/2020: ALT 30; BUN 11; Creatinine, Ser 1.12; Potassium 4.4; Sodium 137  Recent Lipid Panel    Component Value Date/Time   CHOL 125 07/06/2020 1206   TRIG 187 (H) 07/06/2020 1206   HDL 28 (L) 07/06/2020 1206   CHOLHDL 4.5 07/06/2020 1206   CHOLHDL 9.5 05/07/2018 0654   VLDL 40 05/07/2018 0654   LDLCALC 65 07/06/2020 1206   LDLCALC 85 03/06/2017 1138    PHYSICAL EXAM:    VS:  There were no vitals taken for this visit.  BMI:  There is no height or weight on file to calculate BMI.  GEN: Well nourished, well developed male in no acute distress HEENT: normocephalic, atraumatic Neck: no JVD, carotid bruits, or masses Cardiac: ***RRR; no murmurs, rubs, or gallops, no edema  Respiratory:  clear to auscultation bilaterally, normal work of breathing GI: soft, nontender, nondistended, + BS MS: no deformity or atrophy Skin: warm and dry, no rash Neuro:  Alert and Oriented x 3, Strength and sensation are intact, follows commands Psych: euthymic mood, full affect  Wt Readings from Last 3 Encounters:  11/24/20 203 lb 3.2 oz (92.2 kg)  07/06/20 207 lb 6.4 oz (94.1 kg)  06/16/20 208 lb (94.3 kg)     ASSESSMENT & PLAN:   ***     Disposition: F/u with ***   Medication Adjustments/Labs and Tests Ordered: Current medicines are reviewed at length with the patient today.  Concerns regarding medicines are outlined above. Medication  changes, Labs and Tests ordered today are summarized above and listed in the Patient Instructions accessible in Encounters.   Signed, Caleb Pitter, PA-C  12/28/2020 10:42 AM    [facility] [place], [place],   [address] Phone: [phone]; Fax: [phone]

## 2020-12-28 NOTE — Telephone Encounter (Signed)
S/w the pt and he is agreeable to appt for pre op clearance, see previous notes from Dr. Radford Pax as well. Pt seeing Melina Copa, [facility] 12/29/20 at 1:30. Pt thanked me for the call and the help. I will forward notes to [facility] for appt tomorrow. Will send FYI to requesting office pt has appt 12/29/20.

## 2020-12-28 NOTE — Telephone Encounter (Signed)
Chart reviewed. I am seeing patient tomorrow for pre-op clearance. In preparation for visit will route to Dr .Radford Pax for clearance to hold Plavix for procedure as requested and also route to pharm for input on anticoagulation that I can include these recs in final note when patient seen. Also want to clarify to Dr. Radford Pax - is Coumadin anticipated lifelong? On this for hx LV thrombus. Thx.   Dr Radford Pax & pharm team - You can route reply to me, no need to route back to preop.

## 2020-12-28 NOTE — Telephone Encounter (Signed)
Noted, will discuss at f/u OV, await pharm input re: warfarin.

## 2020-12-28 NOTE — Telephone Encounter (Signed)
PharmD weighed in 10/28, on warfarin for hx of LV thrombus. Per Dr Radford Pax, pt will require Lovenox bridge. Will coordinate in Coumadin clinic closer to procedure date.

## 2020-12-29 ENCOUNTER — Ambulatory Visit: Payer: Medicaid Other | Admitting: Physician Assistant

## 2020-12-29 ENCOUNTER — Ambulatory Visit: Payer: Medicare HMO | Admitting: Physician Assistant

## 2020-12-29 DIAGNOSIS — I5022 Chronic systolic (congestive) heart failure: Secondary | ICD-10-CM

## 2020-12-29 DIAGNOSIS — I10 Essential (primary) hypertension: Secondary | ICD-10-CM

## 2020-12-29 DIAGNOSIS — Z01810 Encounter for preprocedural cardiovascular examination: Secondary | ICD-10-CM

## 2020-12-29 DIAGNOSIS — E785 Hyperlipidemia, unspecified: Secondary | ICD-10-CM

## 2020-12-29 DIAGNOSIS — I255 Ischemic cardiomyopathy: Secondary | ICD-10-CM

## 2020-12-29 DIAGNOSIS — I2510 Atherosclerotic heart disease of native coronary artery without angina pectoris: Secondary | ICD-10-CM

## 2020-12-29 NOTE — Telephone Encounter (Addendum)
Addendum: I am having to be out on PAL 2/2 sick child on 12/29/20 so visit will need to be r/s - I did update appt notes to reflect provider needing to review this phone note at time of r/s visit.

## 2020-12-30 ENCOUNTER — Other Ambulatory Visit: Payer: Self-pay | Admitting: Gastroenterology

## 2020-12-31 ENCOUNTER — Encounter: Payer: Self-pay | Admitting: Cardiology

## 2020-12-31 ENCOUNTER — Ambulatory Visit: Payer: Medicare HMO | Admitting: Cardiology

## 2020-12-31 ENCOUNTER — Other Ambulatory Visit: Payer: Self-pay

## 2020-12-31 VITALS — BP 132/88 | HR 77 | Ht 68.5 in | Wt 199.8 lb

## 2020-12-31 DIAGNOSIS — I5022 Chronic systolic (congestive) heart failure: Secondary | ICD-10-CM | POA: Diagnosis not present

## 2020-12-31 DIAGNOSIS — I10 Essential (primary) hypertension: Secondary | ICD-10-CM

## 2020-12-31 DIAGNOSIS — Z01810 Encounter for preprocedural cardiovascular examination: Secondary | ICD-10-CM

## 2020-12-31 DIAGNOSIS — I255 Ischemic cardiomyopathy: Secondary | ICD-10-CM

## 2020-12-31 DIAGNOSIS — Z01818 Encounter for other preprocedural examination: Secondary | ICD-10-CM

## 2020-12-31 DIAGNOSIS — E7800 Pure hypercholesterolemia, unspecified: Secondary | ICD-10-CM | POA: Diagnosis not present

## 2020-12-31 DIAGNOSIS — I513 Intracardiac thrombosis, not elsewhere classified: Secondary | ICD-10-CM

## 2020-12-31 DIAGNOSIS — I2510 Atherosclerotic heart disease of native coronary artery without angina pectoris: Secondary | ICD-10-CM

## 2020-12-31 MED ORDER — ENTRESTO 97-103 MG PO TABS: 1.0000 | ORAL_TABLET | Freq: Two times a day (BID) | ORAL | 3 refills | Status: DC

## 2020-12-31 NOTE — Addendum Note (Signed)
Addended by: Antonieta Iba on: 12/31/2020 01:38 PM   Modules accepted: Orders

## 2020-12-31 NOTE — Patient Instructions (Signed)
Medication Instructions:  Your physician has recommended you make the following change in your medication:  1) INCREASE Entresto to 97-103 mg twice daily   *If you need a refill on your cardiac medications before your next appointment, please call your pharmacy*  Lab Work: [place]: BMET If you have labs (blood work) drawn today and your tests are completely normal, you will receive your results only by: [facility] (if you have MyChart) OR A paper copy in the mail If you have any lab test that is abnormal or we need to change your treatment, we will call you to review the results.  Follow-Up: At [facility], you and your health needs are our priority.  As part of our continuing mission to provide you with exceptional heart care, we have created designated Provider Care Teams.  These Care Teams include your primary Cardiologist (physician) and Advanced Practice Providers (APPs -  Physician Assistants and Nurse Practitioners) who all work together to provide you with the care you need, when you need it.  Your next appointment:   6 month(s)  The format for your next appointment:   In Person  Provider:   You may see Fransico Him, MD or one of the following Advanced Practice Providers on your designated Care Team:   Melina Copa, PA-C Ermalinda Barrios, PA-C  Other Instructions You have been referred to see PharmD in the Hypertension Clinic

## 2020-12-31 NOTE — Progress Notes (Signed)
Cardiology Office Note:    Date:  12/31/2020   ID:  Caleb Thomas, DOB 1960/03/31, MRN [id number]  PCP:  Carlena Hurl, PA-C  Cardiologist:  Fransico Him, MD    Referring MD: Carlena Hurl, PA-C   Chief Complaint  Patient presents with   Coronary Artery Disease   Hypertension   Hyperlipidemia   Cardiomyopathy   Congestive Heart Failure     History of Present Illness:    Caleb Thomas is a 60 y.o. male with a hx of hyperlipidemia, hypertension, remote history of tobacco and ASCAD with admission 04/2018 with Anterior STEMI. Cath showed severe single-vessel CAD with occluded mid LAD and faint left-to-left collaterals. Given onset of symptoms 3-4 days ago and lack of ongoing chest pain,  recommended medical therapy, including 12 months of dual antiplatelet therapy with aspirin and clopidogrel. EF in the 25% range with what appears to be LV dilatation and anteroapical severe hypokinesia.  Started on Carvedilol and spironolactone and eventually ACE I. Echo with contrast did not show LV thrombus but follow up cardiac MRI confirmed an LV thrombus and he was started on coumadin and LifeVest was placed.    Followup 2D echo on max GDMT as BP tolerated showed persistence of LV dysfunction with EF 25-30% .  He was referred to EP and underwent AICD placement for primary prevention in 01/2019 and is followed in Device clinic by [name].    He is here today for preoperative clearance for colonoscopy and endoscopy with possible balloon dilatation.  He is on warfarin as well as Plavix. He is here today for followup and is doing well.  He has chronic DOE that is very stable and unchanged from prior OV.  He denies any chest pain or pressure, PND, orthopnea, LE edema, dizziness, palpitations or syncope. He is compliant with his meds and is tolerating meds with no SE.     Past Medical History:  Diagnosis Date   CAD (coronary artery disease), native coronary artery 04/2018   s/p anterior STEMI with  severe single-vessel CAD with occluded mid LAD and faint left-to-left collaterals.   Chronic systolic (congestive) heart failure (HCC)    Hyperlipidemia    Hypertension 2015   Ischemic cardiomyopathy    EF 25-30% by echo 01/2019 s/p AICD implant   LV (left ventricular) mural thrombus    noted on cardiac MRI but not on echo with contrast>>on chronic warfarin    Past Surgical History:  Procedure Laterality Date   COLONOSCOPY  2013   "normal" per patient, [place]   ICD IMPLANT N/A 02/07/2019   Procedure: ICD IMPLANT;  Surgeon: Evans Lance, MD;  Location: [place] [facility];  Service: Cardiovascular;  Laterality: N/A;   LEFT HEART CATH AND CORONARY ANGIOGRAPHY N/A 05/06/2018   Procedure: LEFT HEART CATH AND CORONARY ANGIOGRAPHY;  Surgeon: [name], MD;  Location: [place] [facility];  Service: Cardiovascular;  Laterality: N/A;    Current Medications: Current Meds  Medication Sig   atorvastatin (LIPITOR) 80 MG tablet Take 1 tablet by mouth once daily   clopidogrel (PLAVIX) 75 MG tablet Take 1 tablet by mouth once daily with breakfast   dapagliflozin propanediol (FARXIGA) 10 MG TABS tablet Take 1 tablet (10 mg total) by mouth daily.   metoprolol (TOPROL XL) 200 MG 24 hr tablet Take 1 tablet (200 mg total) by mouth daily.   sacubitril-valsartan (ENTRESTO) 49-51 MG Take 1 tablet by mouth 2 (two) times daily.   spironolactone (ALDACTONE) 25 MG tablet  Take 0.5 tablets (12.5 mg total) by mouth daily.   warfarin (COUMADIN) 5 MG tablet TAKE AS DIRECTED BY  COUMADIN  CLINIC     Allergies:   Patient has no known allergies.   Social History   Socioeconomic History   Marital status: Married    Spouse name: Not on file   Number of children: Not on file   Years of education: Not on file   Highest education level: Not on file  Occupational History   Not on file  Tobacco Use   Smoking status: Former   Smokeless tobacco: Never   Tobacco comments:    quit smoking 60 years old ago   Vaping Use   Vaping Use: Never used  Substance and Sexual Activity   Alcohol use: Yes    Alcohol/week: 3.0 standard drinks    Types: 3 Cans of beer per week    Comment: on weekend    Drug use: Not on file   Sexual activity: Not on file  Other Topics Concern   Not on file  Social History Narrative   Married, 10 children, 4 grandchildren.   Surveyor, mining at [employer].   Exercise with walking on treadmill few days per week.   Member of [employer].    06/2017   Social Determinants of Health   Financial Resource Strain: Not on file  Food Insecurity: Not on file  Transportation Needs: Not on file  Physical Activity: Not on file  Stress: Not on file  Social Connections: Not on file     Family History: The patient's family history includes Heart disease in his father; Hypertension in his brother and brother; Stroke in his brother and mother. There is no history of Cancer.  ROS:   Please see the history of present illness.    ROS  All other systems reviewed and negative.   EKGs/Labs/Other Studies Reviewed:    The following studies were reviewed today:  2D echo 10/2018 IMPRESSIONS    1. The left ventricle has severely reduced systolic function, with an  ejection fraction of 25-30%. The cavity size was normal. Severe basal  septal hypertrophy. Left ventricular diastolic Doppler parameters are  consistent with pseudonormalization.   2. There is akinessis of the mid anteroseptal and inferoseptal, apical  and apical lateral walls. There is akinesis of the mid and apical  inferior, anterior and apical inferolateral walls. No evidence of apical  thrombus by definity contrast study.   3. The right ventricle has normal systolic function. The cavity was  normal. There is no increase in right ventricular wall thickness. Right  ventricular systolic pressure is normal with an estimated pressure of 32.3  mmHg.   4. Left atrial size was mildly dilated.   5. The aortic valve is  tricuspid. Moderate sclerosis of the aortic valve.  Aortic valve regurgitation is trivial by color flow Doppler.   6. The aorta is normal unless otherwise noted.   EKG:  EKG is  ordered today and showed NSR with anterior and inferior infarct with T wave abnormality in inferolateral and anterior leads - no change from prior EKG 04/2020  Recent Labs: 05/05/2020: Hemoglobin 15.4; Magnesium 2.0; Platelets 270 07/06/2020: ALT 30; BUN 11; Creatinine, Ser 1.12; Potassium 4.4; Sodium 137   Recent Lipid Panel    Component Value Date/Time   CHOL 125 07/06/2020 1206   TRIG 187 (H) 07/06/2020 1206   HDL 28 (L) 07/06/2020 1206   CHOLHDL 4.5 07/06/2020 1206   CHOLHDL 9.5  05/07/2018 0654   VLDL 40 05/07/2018 0654   LDLCALC 65 07/06/2020 1206   LDLCALC 85 03/06/2017 1138    Physical Exam:    VS:  BP 132/88 (BP Location: Left Arm, Patient Position: Sitting, Cuff Size: Normal)   Pulse 77   Ht 5' 8.5" (1.74 m)   Wt 199 lb 12.8 oz (90.6 kg)   SpO2 98%   BMI 29.94 kg/m     Wt Readings from Last 3 Encounters:  12/31/20 199 lb 12.8 oz (90.6 kg)  11/24/20 203 lb 3.2 oz (92.2 kg)  07/06/20 207 lb 6.4 oz (94.1 kg)     GEN: Well nourished, well developed in no acute distress HEENT: Normal NECK: No JVD; No carotid bruits LYMPHATICS: No lymphadenopathy CARDIAC:RRR, no murmurs, rubs, gallops RESPIRATORY:  Clear to auscultation without rales, wheezing or rhonchi  ABDOMEN: Soft, non-tender, non-distended MUSCULOSKELETAL:  No edema; No deformity  SKIN: Warm and dry NEUROLOGIC:  Alert and oriented x 3 PSYCHIATRIC:  Normal affect    ASSESSMENT:    1. Coronary artery disease involving native coronary artery of native heart without angina pectoris   2. Ischemic cardiomyopathy   3. Chronic systolic (congestive) heart failure ([place])   4. Essential hypertension   5. Pure hypercholesterolemia   6. LV (left ventricular) mural thrombus   7. Preoperative clearance    PLAN:    In order of problems listed  above:  1.  ASCAD -s/p Anterior STEMI 04/2018 with cath showing severe single-vessel CAD with occluded mid LAD and faint left-to-left collaterals. Given onset of symptoms 3-4 days ago and lack of ongoing chest pain,  recommended medical therapy -Well and denies any anginal symptoms since I saw him last -Continue prescription drug management with Plavix 75 mg daily, Toprol 200 mg daily and high-dose statin therapy with as needed refills  -No ASA due to need for warfarin for LV thrombus  2.  Ischemic DCM -EF 25-30% by echo 01/2019 s/p AICD for primary prevention -try to maximize GDMT was much as BP allows -see #3  3.  Chronic systolic CHF -last EF assessment 25-30% -s/p AICD followed in device clinic by EP -He denies any significant shortness of breath or lower extremity edema and weight is stable -He appears euvolemic on exam today -Continue prescription drug management with Toprol-XL 200 mg daily, Spir 12.5 mg daily and Farxiga 10 mg daily refills  -increase Entresto to 97-103mg  BID -followup with PharmD with HTN clinic in 2 weeks to make sure he is tolerating the higher dose of Entresto -Check bmet in 2 weeks  4.  HTN -BP is well controlled on exam today -Continue prescription drug management with Toprol-XL 200 mg daily, Entresto and Spiro 12.5 mg daily with as needed refills   5.  HLD -LDL goal < 70 -I have personally reviewed and interpreted outside labs performed by patient's PCP which showed LDL 65, HDL 28, tags 187, ALT 30 in May 2022 -continue drug management with atorvastatin 80 mg daily with as needed refills atorvastatin 80mg  daily  6. LV thrombus -found to have LV thrombus on Cardiac MRI - not noted on echo with contrast and is on warfarin -INR followed in coumadin clinic -He denies any bleeding problems -Check CBC today  7.  Preoperative clearance -He needs clearance for colonoscopy -A low risk procedure without general anesthesia -Based on the revised cardiac  risk index his perioperative risk of major cardiac event is 6.6% and likely lower than this given his low risk procedure and  not using general anesthesia -Given his history of CHF would be cautious with IV fluids -Our Coumadin clinic will manage recommendations for his Coumadin hold prior to and after his procedure   Medication Adjustments/Labs and Tests Ordered: Current medicines are reviewed at length with the patient today.  Concerns regarding medicines are outlined above.  Orders Placed This Encounter  Procedures   EKG 12-Lead    No orders of the defined types were placed in this encounter.  Followup with me in 6 months  Signed, Fransico Him, MD  12/31/2020 1:28 PM    [place]

## 2020-12-31 NOTE — Addendum Note (Signed)
Addended by: Antonieta Iba on: 12/31/2020 01:51 PM   Modules accepted: Orders

## 2021-01-04 ENCOUNTER — Telehealth: Payer: Self-pay | Admitting: Cardiology

## 2021-01-04 MED ORDER — ENTRESTO 97-103 MG PO TABS: 1.0000 | ORAL_TABLET | Freq: Two times a day (BID) | ORAL | 3 refills | Status: DC

## 2021-01-04 NOTE — Telephone Encounter (Signed)
   Pt c/o medication issue:  1. Name of Medication: sacubitril-valsartan (ENTRESTO) 97-103 MG  2. How are you currently taking this medication (dosage and times per day)? Take 1 tablet by mouth 2 (two) times daily.  3. Are you having a reaction (difficulty breathing--STAT)?   4. What is your medication issue? Pt wanted to know if this was sent to [employer], he has pt assistance and according to [employer] they haven't receive the prescription yet from Dr. Radford Pax

## 2021-01-04 NOTE — Telephone Encounter (Signed)
Spoke with the patient and advised him that his prescription was sent to [facility]. He states that is where it was supposed to be sent but they told him that they had not received the prescription.  Call placed to pharmacy and was advised that the prescription for Entresto needs to be sent to a different [facility] location based out of [place], phone number [phone].  Prescription has been sent to correct location.

## 2021-01-12 ENCOUNTER — Ambulatory Visit (INDEPENDENT_AMBULATORY_CARE_PROVIDER_SITE_OTHER): Payer: Medicare HMO

## 2021-01-12 ENCOUNTER — Other Ambulatory Visit: Payer: Medicare HMO | Admitting: *Deleted

## 2021-01-12 ENCOUNTER — Other Ambulatory Visit: Payer: Self-pay

## 2021-01-12 DIAGNOSIS — I2510 Atherosclerotic heart disease of native coronary artery without angina pectoris: Secondary | ICD-10-CM

## 2021-01-12 DIAGNOSIS — I2102 ST elevation (STEMI) myocardial infarction involving left anterior descending coronary artery: Secondary | ICD-10-CM | POA: Diagnosis not present

## 2021-01-12 DIAGNOSIS — Z5181 Encounter for therapeutic drug level monitoring: Secondary | ICD-10-CM

## 2021-01-12 LAB — BASIC METABOLIC PANEL
BUN/Creatinine Ratio: 9 — ABNORMAL LOW (ref 10–24)
BUN: 11 mg/dL (ref 8–27)
CO2: 25 mmol/L (ref 20–29)
Calcium: 9.2 mg/dL (ref 8.6–10.2)
Chloride: 104 mmol/L (ref 96–106)
Creatinine, Ser: 1.19 mg/dL (ref 0.76–1.27)
Glucose: 80 mg/dL (ref 70–99)
Potassium: 4.2 mmol/L (ref 3.5–5.2)
Sodium: 142 mmol/L (ref 134–144)
eGFR: 69 mL/min/{1.73_m2} (ref >59)

## 2021-01-12 LAB — POCT INR: INR: 2.8 (ref 2.0–3.0)

## 2021-01-12 NOTE — Patient Instructions (Signed)
Description   Continue taking 1 tablet daily except 1/2 tablet on Sundays and Thursdays. Recheck INR in 6 weeks will need Lovenox bridging prior to EGD procedure on 02/26/21. Coumadin Clinic [phone] Main [phone]

## 2021-01-14 ENCOUNTER — Telehealth: Payer: Self-pay

## 2021-01-14 ENCOUNTER — Ambulatory Visit: Payer: Medicare HMO | Admitting: Pharmacist

## 2021-01-14 ENCOUNTER — Other Ambulatory Visit: Payer: Self-pay

## 2021-01-14 VITALS — BP 124/80 | HR 79 | Wt 200.0 lb

## 2021-01-14 DIAGNOSIS — I5022 Chronic systolic (congestive) heart failure: Secondary | ICD-10-CM

## 2021-01-14 MED ORDER — SPIRONOLACTONE 25 MG PO TABS: 25.0000 mg | ORAL_TABLET | Freq: Every day | ORAL | 3 refills | Status: DC

## 2021-01-14 NOTE — Telephone Encounter (Signed)
-----   Message from Sueanne Margarita, MD sent at 01/14/2021  3:00 PM EST ----- Yes please order echo ----- Message ----- From: Leeroy Bock, RPH-CPP Sent: 01/14/2021   1:51 PM EST To: Sueanne Margarita, MD, Antonieta Iba, RN  Pt seen today for CHF follow up, he's now on optimal doses of all CHF meds. He hasn't had an echo checked since 2020 and mentioned he thought it was discussed at his last visit. I didn't see that anything was scheduled and wanted to reach out about rechecking an echo to reassess LVEF on GDMT. Thanks!

## 2021-01-14 NOTE — Telephone Encounter (Signed)
Spoke with the patient and scheduled him for an echocardiogram.  

## 2021-01-14 NOTE — Progress Notes (Signed)
Patient ID: Caleb Thomas                 DOB: Sep 01, 1960                      MRN: [id number]     HPI: Caleb Thomas is a 60 y.o. male referred by Dr. Radford Pax to pharmacy clinic for HF medication management. PMH is significant for HTN, HLD, remote tobacco abuse, and ASCVD with anterior STEMI 04/2018. Cath showed severe single vessel CAD with occluded mid LAD and faint left-to-left collaterals with recommendation for medical therapy since symptom onset was 3-4 days prior and pt didn't have ongoing chest pain. EF was in the 25% range with LV dilatation and anteroapical severe hypokinesia. He was started on carvedilol, spironolactone, and ACEi. Follow up cardiac MRI showed LV thrombus so pt was started on warfarin and LifeVest was placed. Follow up 2D echo 10/2018 on max GDMT as BP tolerated showed persistent LV dysfunction with EF 25-30%. He was referred to EP and underwent AICD placement. Still has problems with exertional fatigue and DOE which have been present since his MI.   At visit with Dr Radford Pax on 3/2, lisinopril was changed to [facility], however he called clinic 2 days later unable to start [facility] due to cost as he is uninsured. He was started on losartan 25mg  daily instead. I saw pt in March and April to optimize his CHF medications. He filled out pt assistance applications for [employer] and was approved. Spironolactone was decreased to 12.5mg  daily after SCr increased from 0.96 to 1.28 although he had already been on spironolactone for some time, recent med changes before SCr increase included starting Farxiga 1 week prior which was likely the reason for SCr increase. Entresto had also been started 2-3 weeks prior. Repeat BMET 1 week later showed SCr increase to 1.38. No med changes made but pt advised to avoid NSAIDS. F/u SCr improved to 1.12. Most recently, Entresto dose was increased to 97-103mg  BID on 11/3. F/u BMET with SCr of 1.19    Pt presents today in good spirits. Reports  tolerating medications well. Denies dizziness, LE edema, and headaches. Home BP typically 110/75 and home weight 197 lbs today. Did receive higher dose of Entresto from pt assistance program. Since I saw pt last, he has been added to his wife's insurance which is a Research scientist (medical). Reports he will be changing to a different insurance plan in January. Discussed that his pt assistance for [place] and Delene Loll will expire at the end of December and to make sure he gets 90 day refills before then.   Current CHF meds:  Entresto 97-103mg  BID - 8am, 6pm Farxiga 10mg  daily - 8am Toprol 200mg  daily - 8pm Spironolactone 12.5mg  daily - 8am   BP goal: <130/52mmHg   Family History: Heart disease in his father; Hypertension in his brother and brother; Stroke in his brother and mother. There is no history of Cancer   Social History: Former tobacco abuse. Occasional alcohol use.   Diet: Wife cooks at home and is diabetic, eats out occasionally. Doesn't fry food. Cooks with olive oil. Avoids adding salt to his food, uses Mrs Deliah Boston salt substitute.    Exercise: Walks outside or uses treadmill at home almost daily for 15 minutes.  Wt Readings from Last 3 Encounters:  12/31/20 199 lb 12.8 oz (90.6 kg)  11/24/20 203 lb 3.2 oz (92.2 kg)  07/06/20 207 lb 6.4 oz (94.1 kg)  BP Readings from Last 3 Encounters:  12/31/20 132/88  11/24/20 122/86  07/06/20 110/74   Pulse Readings from Last 3 Encounters:  12/31/20 77  11/24/20 61  07/06/20 74    Renal function: Estimated Creatinine Clearance: 71 mL/min (by C-G formula based on SCr of 1.19 mg/dL).  Past Medical History:  Diagnosis Date   CAD (coronary artery disease), native coronary artery 04/2018   s/p anterior STEMI with severe single-vessel CAD with occluded mid LAD and faint left-to-left collaterals.   Chronic systolic (congestive) heart failure (HCC)    Hyperlipidemia    Hypertension 2015   Ischemic cardiomyopathy    EF 25-30% by echo  01/2019 s/p AICD implant   LV (left ventricular) mural thrombus    noted on cardiac MRI but not on echo with contrast>>on chronic warfarin    Current Outpatient Medications on File Prior to Visit  Medication Sig Dispense Refill   atorvastatin (LIPITOR) 80 MG tablet Take 1 tablet by mouth once daily 90 tablet 3   clopidogrel (PLAVIX) 75 MG tablet Take 1 tablet by mouth once daily with breakfast 90 tablet 2   dapagliflozin propanediol (FARXIGA) 10 MG TABS tablet Take 1 tablet (10 mg total) by mouth daily. 90 tablet 3   metoprolol (TOPROL XL) 200 MG 24 hr tablet Take 1 tablet (200 mg total) by mouth daily. 90 tablet 3   sacubitril-valsartan (ENTRESTO) 97-103 MG Take 1 tablet by mouth 2 (two) times daily. 180 tablet 3   spironolactone (ALDACTONE) 25 MG tablet Take 0.5 tablets (12.5 mg total) by mouth daily. 45 tablet 3   warfarin (COUMADIN) 5 MG tablet TAKE AS DIRECTED BY  COUMADIN  CLINIC 35 tablet 3   No current facility-administered medications on file prior to visit.    No Known Allergies   Assessment/Plan:  1. CHF - BP at goal < 130/58mmHg. Will increase spironolactone to 25mg  daily and will continue Entresto 97-103mg  BID, Farxiga 10mg  daily, and Toprol 200mg  daily. Will recheck BMET next Wednesday. Pt now optimized on GDMT for his CHF. Has not had an echo checked since 2020 and his CHF meds were optimized this year. Will reach out to Dr Radford Pax regarding follow up echo to reassess LVEF. Pt was also advised to bring in his new insurance in January, will need to send in [place] rx to his pharmacy to see if affordable before re-applying for patient assistance (enrolled earlier this year when he was uninsured). F/u with PharmD as needed.  Miranda Garber E. Shenay Torti, PharmD, BCACP, [place] [address], [place], [place] [address] Phone: [phone]; Fax: [phone] 01/14/2021 1:49 PM

## 2021-01-14 NOTE — Patient Instructions (Addendum)
It was nice to see you today!  Your blood pressure is at goal < 130/62mmHg  Increase your spironolactone to 1 full tablet (25mg ) daily  Continue taking your other medications and monitor your blood pressure at home  Repeat lab work next Wednesday, November 23. Please come in before 9am so the labs result by the end of the day  I'll reach out to Dr Radford Pax about rechecking an echo  Let us know when you have new insurance in January, we can try sending the Delene Loll and Farxiga prescriptions to your pharmacy to check on the copay

## 2021-01-20 ENCOUNTER — Other Ambulatory Visit: Payer: Self-pay

## 2021-01-20 ENCOUNTER — Other Ambulatory Visit: Payer: Medicare HMO | Admitting: *Deleted

## 2021-01-20 DIAGNOSIS — I5022 Chronic systolic (congestive) heart failure: Secondary | ICD-10-CM | POA: Diagnosis not present

## 2021-01-20 LAB — BASIC METABOLIC PANEL
BUN/Creatinine Ratio: 10 (ref 10–24)
BUN: 11 mg/dL (ref 8–27)
CO2: 24 mmol/L (ref 20–29)
Calcium: 8.6 mg/dL (ref 8.6–10.2)
Chloride: 103 mmol/L (ref 96–106)
Creatinine, Ser: 1.15 mg/dL (ref 0.76–1.27)
Glucose: 95 mg/dL (ref 70–99)
Potassium: 3.9 mmol/L (ref 3.5–5.2)
Sodium: 139 mmol/L (ref 134–144)
eGFR: 72 mL/min/{1.73_m2} (ref >59)

## 2021-01-29 ENCOUNTER — Other Ambulatory Visit: Payer: Self-pay | Admitting: Cardiology

## 2021-02-04 ENCOUNTER — Ambulatory Visit (HOSPITAL_COMMUNITY): Payer: Medicare HMO | Attending: Cardiovascular Disease

## 2021-02-04 ENCOUNTER — Other Ambulatory Visit: Payer: Self-pay

## 2021-02-04 DIAGNOSIS — I5022 Chronic systolic (congestive) heart failure: Secondary | ICD-10-CM | POA: Insufficient documentation

## 2021-02-04 LAB — ECHOCARDIOGRAM COMPLETE
Area-P 1/2: 3.48 cm2
S' Lateral: 3.5 cm

## 2021-02-04 MED ORDER — PERFLUTREN LIPID MICROSPHERE
1.0000 mL | INTRAVENOUS | Status: AC | PRN
  Administered 2021-02-04: 2 mL via INTRAVENOUS

## 2021-02-08 ENCOUNTER — Ambulatory Visit (INDEPENDENT_AMBULATORY_CARE_PROVIDER_SITE_OTHER): Payer: Medicare HMO

## 2021-02-08 DIAGNOSIS — I5022 Chronic systolic (congestive) heart failure: Secondary | ICD-10-CM

## 2021-02-09 LAB — CUP PACEART REMOTE DEVICE CHECK
Date Time Interrogation Session: 20221212103050
Implantable Lead Implant Date: 20201210
Implantable Lead Location: 753860
Implantable Lead Model: 436909
Implantable Lead Serial Number: 8102423
Implantable Pulse Generator Implant Date: 20201210
Pulse Gen Model: 429525
Pulse Gen Serial Number: 84745362

## 2021-02-12 ENCOUNTER — Telehealth: Payer: Self-pay | Admitting: Cardiology

## 2021-02-12 NOTE — Telephone Encounter (Signed)
Attempted to call Pt.  No answer.  Left VM requesting call back.

## 2021-02-12 NOTE — Telephone Encounter (Signed)
Pt is returning call.  

## 2021-02-12 NOTE — Telephone Encounter (Signed)
Returned call to Pt.  Echo results discussed.  All questions answered.

## 2021-02-12 NOTE — Telephone Encounter (Signed)
Patient is returning call to discuss echo results. °

## 2021-02-15 ENCOUNTER — Encounter (HOSPITAL_COMMUNITY): Payer: Self-pay | Admitting: Gastroenterology

## 2021-02-15 NOTE — Progress Notes (Signed)
Attempted to obtain medical history via telephone, unable to reach at this time. I left a voicemail to return pre surgical testing department's phone call.  

## 2021-02-17 NOTE — Progress Notes (Signed)
Remote ICD transmission.   

## 2021-02-18 ENCOUNTER — Other Ambulatory Visit: Payer: Self-pay

## 2021-02-18 ENCOUNTER — Ambulatory Visit (INDEPENDENT_AMBULATORY_CARE_PROVIDER_SITE_OTHER): Payer: Medicare HMO | Admitting: *Deleted

## 2021-02-18 DIAGNOSIS — I2102 ST elevation (STEMI) myocardial infarction involving left anterior descending coronary artery: Secondary | ICD-10-CM | POA: Diagnosis not present

## 2021-02-18 DIAGNOSIS — Z5181 Encounter for therapeutic drug level monitoring: Secondary | ICD-10-CM

## 2021-02-18 LAB — POCT INR: INR: 2.6 (ref 2.0–3.0)

## 2021-02-18 MED ORDER — ENOXAPARIN SODIUM 80 MG/0.8ML IJ SOSY: 80.0000 mg | PREFILLED_SYRINGE | Freq: Two times a day (BID) | INTRAMUSCULAR | 1 refills | Status: DC

## 2021-02-18 NOTE — Patient Instructions (Addendum)
Description   Continue taking 1 tablet daily except 1/2 tablet on Sundays and Thursdays. Follow upcoming procedure instructions. Recheck INR in 1 week post EGD procedure on 02/26/21 (normally 6 weeks). Coumadin Clinic [phone] Main [phone]      12 /24: Last dose of warfarin.  12/25: No warfarin or enoxaparin (Lovenox).  12/26: Inject enoxaparin 80mg  in the fatty abdominal tissue at least 2 inches from the belly button twice a day about 12 hours apart, 8am and 8pm rotate sites. No warfarin.  12/27: Inject enoxaparin in the fatty tissue every 12 hours, 8am and 8pm. No warfarin.  12/28: Inject enoxaparin in the fatty tissue every 12 hours, 8am and 8pm. No warfarin.  12/29: Inject enoxaparin in the fatty tissue in the morning at 8 am (No PM dose). No warfarin.  12/30: Procedure Day - No enoxaparin - Resume warfarin in the evening or as directed by doctor (take an extra half tablet with usual dose for 2 days then resume normal dose).  12/31: Resume enoxaparin inject in the fatty tissue every 12 hours and take warfarin  1/1: Inject enoxaparin in the fatty tissue every 12 hours and take warfarin  1/2: Inject enoxaparin in the fatty tissue every 12 hours and take warfarin  1/3: Inject enoxaparin in the fatty tissue every 12 hours and take warfarin  1/4: Inject enoxaparin in the fatty tissue every 12 hours and take warfarin  1/5: Inject enoxaparin in the fatty tissue in the morning then report to warfarin appt to check INR.

## 2021-02-25 NOTE — Anesthesia Preprocedure Evaluation (Addendum)
Anesthesia Evaluation  Patient identified by MRN, date of birth, ID band Patient awake    Reviewed: Allergy & Precautions, NPO status , Patient's Chart, lab work & pertinent test results, reviewed documented beta blocker date and time   History of Anesthesia Complications Negative for: history of anesthetic complications  Airway Mallampati: I  TM Distance: >3 FB Neck ROM: Full    Dental  (+) Edentulous Upper, Edentulous Lower   Pulmonary former smoker,    breath sounds clear to auscultation       Cardiovascular hypertension, Pt. on medications and Pt. on home beta blockers (-) angina+ CAD (single vessel LAD), + Past MI and +CHF (entresto)  + Cardiac Defibrillator  Rhythm:Regular Rate:Normal  02/04/2021  ECHO: EF 30-35%, Grade 1 DD, Mild-mod MR, mild-mod TR   Neuro/Psych negative neurological ROS     GI/Hepatic Neg liver ROS, dysphagia   Endo/Other  Morbid obesity  Renal/GU negative Renal ROS     Musculoskeletal   Abdominal   Peds  Hematology plavix Coumadin: INR 2.6   Anesthesia Other Findings   Reproductive/Obstetrics                           Anesthesia Physical Anesthesia Plan  ASA: 4  Anesthesia Plan: MAC   Post-op Pain Management: Minimal or no pain anticipated   Induction:   PONV Risk Score and Plan: 1 and Treatment may vary due to age or medical condition  Airway Management Planned: Natural Airway and Nasal Cannula  Additional Equipment: None  Intra-op Plan:   Post-operative Plan:   Informed Consent: I have reviewed the patients History and Physical, chart, labs and discussed the procedure including the risks, benefits and alternatives for the proposed anesthesia with the patient or authorized representative who has indicated his/her understanding and acceptance.       Plan Discussed with: CRNA and Surgeon  Anesthesia Plan Comments:        Anesthesia Quick  Evaluation

## 2021-02-26 ENCOUNTER — Encounter (HOSPITAL_COMMUNITY)
Admission: RE | Disposition: A | Discharge status: Home / Self Care | Payer: Self-pay | Source: Ambulatory Visit | Attending: Gastroenterology

## 2021-02-26 ENCOUNTER — Ambulatory Visit (HOSPITAL_COMMUNITY): Payer: Medicare HMO | Admitting: Certified Registered Nurse Anesthetist

## 2021-02-26 ENCOUNTER — Ambulatory Visit (HOSPITAL_COMMUNITY)
Admission: RE | Admit: 2021-02-26 | Discharge: 2021-02-26 | Disposition: A | Discharge status: Home / Self Care | Payer: Medicare HMO | Source: Ambulatory Visit | Attending: Gastroenterology | Admitting: Gastroenterology

## 2021-02-26 ENCOUNTER — Other Ambulatory Visit: Payer: Self-pay

## 2021-02-26 DIAGNOSIS — K222 Esophageal obstruction: Secondary | ICD-10-CM | POA: Insufficient documentation

## 2021-02-26 DIAGNOSIS — D123 Benign neoplasm of transverse colon: Secondary | ICD-10-CM | POA: Diagnosis not present

## 2021-02-26 DIAGNOSIS — Z6829 Body mass index (BMI) 29.0-29.9, adult: Secondary | ICD-10-CM | POA: Diagnosis not present

## 2021-02-26 DIAGNOSIS — K449 Diaphragmatic hernia without obstruction or gangrene: Secondary | ICD-10-CM | POA: Insufficient documentation

## 2021-02-26 DIAGNOSIS — D120 Benign neoplasm of cecum: Secondary | ICD-10-CM | POA: Diagnosis not present

## 2021-02-26 DIAGNOSIS — I5022 Chronic systolic (congestive) heart failure: Secondary | ICD-10-CM | POA: Insufficient documentation

## 2021-02-26 DIAGNOSIS — R1310 Dysphagia, unspecified: Secondary | ICD-10-CM | POA: Insufficient documentation

## 2021-02-26 DIAGNOSIS — I252 Old myocardial infarction: Secondary | ICD-10-CM | POA: Diagnosis not present

## 2021-02-26 DIAGNOSIS — D124 Benign neoplasm of descending colon: Secondary | ICD-10-CM | POA: Insufficient documentation

## 2021-02-26 DIAGNOSIS — Z95810 Presence of automatic (implantable) cardiac defibrillator: Secondary | ICD-10-CM | POA: Insufficient documentation

## 2021-02-26 DIAGNOSIS — K5730 Diverticulosis of large intestine without perforation or abscess without bleeding: Secondary | ICD-10-CM | POA: Diagnosis not present

## 2021-02-26 DIAGNOSIS — I2510 Atherosclerotic heart disease of native coronary artery without angina pectoris: Secondary | ICD-10-CM | POA: Diagnosis not present

## 2021-02-26 DIAGNOSIS — Z1211 Encounter for screening for malignant neoplasm of colon: Secondary | ICD-10-CM | POA: Insufficient documentation

## 2021-02-26 DIAGNOSIS — K2100 Gastro-esophageal reflux disease with esophagitis, without bleeding: Secondary | ICD-10-CM | POA: Insufficient documentation

## 2021-02-26 DIAGNOSIS — Z139 Encounter for screening, unspecified: Secondary | ICD-10-CM | POA: Diagnosis not present

## 2021-02-26 DIAGNOSIS — K635 Polyp of colon: Secondary | ICD-10-CM | POA: Diagnosis not present

## 2021-02-26 DIAGNOSIS — I110 Hypertensive heart disease with heart failure: Secondary | ICD-10-CM | POA: Diagnosis not present

## 2021-02-26 DIAGNOSIS — Z87891 Personal history of nicotine dependence: Secondary | ICD-10-CM | POA: Insufficient documentation

## 2021-02-26 HISTORY — PX: POLYPECTOMY: SHX5525

## 2021-02-26 HISTORY — PX: ESOPHAGOGASTRODUODENOSCOPY (EGD) WITH PROPOFOL: SHX5813

## 2021-02-26 HISTORY — PX: COLONOSCOPY WITH PROPOFOL: SHX5780

## 2021-02-26 SURGERY — ESOPHAGOGASTRODUODENOSCOPY (EGD) WITH PROPOFOL
Anesthesia: Monitor Anesthesia Care

## 2021-02-26 MED ORDER — PHENYLEPHRINE 40 MCG/ML (10ML) SYRINGE FOR IV PUSH (FOR BLOOD PRESSURE SUPPORT)
PREFILLED_SYRINGE | INTRAVENOUS | Status: DC | PRN
  Administered 2021-02-26: 120 ug via INTRAVENOUS

## 2021-02-26 MED ORDER — SODIUM CHLORIDE 0.9 % IV SOLN: INTRAVENOUS | Status: DC

## 2021-02-26 MED ORDER — PROPOFOL 500 MG/50ML IV EMUL
INTRAVENOUS | Status: AC
  Filled 2021-02-26: qty 50

## 2021-02-26 MED ORDER — PROPOFOL 10 MG/ML IV BOLUS
INTRAVENOUS | Status: DC | PRN
  Administered 2021-02-26 (×3): 25 mg via INTRAVENOUS

## 2021-02-26 MED ORDER — LIDOCAINE 2% (20 MG/ML) 5 ML SYRINGE
INTRAMUSCULAR | Status: DC | PRN
  Administered 2021-02-26: 50 mg via INTRAVENOUS

## 2021-02-26 MED ORDER — LACTATED RINGERS IV SOLN: INTRAVENOUS | Status: DC

## 2021-02-26 MED ORDER — PROPOFOL 500 MG/50ML IV EMUL
INTRAVENOUS | Status: DC | PRN
  Administered 2021-02-26: 100 ug/kg/min via INTRAVENOUS

## 2021-02-26 SURGICAL SUPPLY — 25 items

## 2021-02-26 NOTE — H&P (Signed)
Caleb Thomas HPI: The patient is a 60 year old black male presenting with a chief complaint of dysphagia. This issue has been ongoing for the last 1-2 years. He states that he has issues swallowing hard foods such a steak but no issues with softer foods. He has tried cutting his food into smaller pieces and chewing his food more with no significant relief. He states that when he experiences the dysphagia it is accompanied by epigastric pain that can somtimes be alleviated with standing. If the standing does not alleviate the discomfort he will consume water which usually results in an episode of vomitting; the episodes of vomitting occur about twice per month. The patient denies any heart burn or chest pain. The patient does not consume alcohol and quit smoking over 12 years ago. His primary care provider referred him for a barium swallow on 12/04/20 which revealed moderate persistent narrowing in the distal esophagus. The patient has not previously had an upper endoscopy performed.   Past Medical History:  Diagnosis Date   CAD (coronary artery disease), native coronary artery 04/2018   s/p anterior STEMI with severe single-vessel CAD with occluded mid LAD and faint left-to-left collaterals.   Chronic systolic (congestive) heart failure (HCC)    Hyperlipidemia    Hypertension 2015   Ischemic cardiomyopathy    EF 25-30% by echo 01/2019 s/p AICD implant   LV (left ventricular) mural thrombus    noted on cardiac MRI but not on echo with contrast>>on chronic warfarin    Past Surgical History:  Procedure Laterality Date   COLONOSCOPY  2013   "normal" per patient, [place]   ICD IMPLANT N/A 02/07/2019   Procedure: ICD IMPLANT;  Surgeon: Evans Lance, MD;  Location: [facility];  Service: Cardiovascular;  Laterality: N/A;   LEFT HEART CATH AND CORONARY ANGIOGRAPHY N/A 05/06/2018   Procedure: LEFT HEART CATH AND CORONARY ANGIOGRAPHY;  Surgeon: [name], MD;  Location: [facility];  Service: Cardiovascular;  Laterality: N/A;    Family History  Problem Relation Age of Onset   Stroke Mother    Heart disease Father    Hypertension Brother    Stroke Brother    Hypertension Brother    Cancer Neg Hx     Social History:  reports that he has quit smoking. He has never used smokeless tobacco. He reports current alcohol use of about 3.0 standard drinks per week. No history on file for drug use.  Allergies: No Known Allergies  Medications: Scheduled: Continuous:  sodium chloride     sodium chloride     lactated ringers 10 mL/hr at 02/26/21 0720    No results found for this or any previous visit (from the past 24 hour(s)).   No results found.  ROS:  As stated above in the HPI otherwise negative.  Blood pressure (!) 145/93, pulse 80, temperature 97.8 F (36.6 C), temperature source Oral, height 5\' 8"  (1.727 m), weight 88.5 kg, SpO2 98 %.    PE: Gen: NAD, Alert and Oriented HEENT:  [place]/AT, EOMI Neck: Supple, no LAD Lungs: CTA Bilaterally CV: RRR without M/G/R ABD: Soft, NTND, +BS Ext: No C/C/E  Assessment/Plan: 1) Dysphagia - EGD with dilation. 2) Screening colonoscopy  Caleb Thomas D 02/26/2021, 7:21 AM

## 2021-02-26 NOTE — Anesthesia Procedure Notes (Signed)
Procedure Name: MAC Date/Time: 02/26/2021 7:34 AM Performed by: Claudia Desanctis, CRNA Pre-anesthesia Checklist: Patient identified, Emergency Drugs available, Suction available and Patient being monitored Patient Re-evaluated:Patient Re-evaluated prior to induction Oxygen Delivery Method: Simple face mask

## 2021-02-26 NOTE — Anesthesia Postprocedure Evaluation (Signed)
Anesthesia Post Note  Patient: Barbara Keng  Procedure(s) Performed: ESOPHAGOGASTRODUODENOSCOPY (EGD) WITH PROPOFOL COLONOSCOPY WITH PROPOFOL POLYPECTOMY     Patient location during evaluation: Endoscopy Anesthesia Type: MAC Level of consciousness: awake and alert, patient cooperative and oriented Pain management: pain level controlled Vital Signs Assessment: post-procedure vital signs reviewed and stable Respiratory status: nonlabored ventilation, respiratory function stable and spontaneous breathing Cardiovascular status: blood pressure returned to baseline and stable Postop Assessment: no apparent nausea or vomiting and able to ambulate Anesthetic complications: no   No notable events documented.  Last Vitals:  Vitals:   02/26/21 0831 02/26/21 0835  BP: 112/86 122/78  Pulse: 71 78  Resp: 19 (!) 22  Temp:    SpO2: 98% 96%    Last Pain:  Vitals:   02/26/21 0816  TempSrc: Oral  PainSc:                  Milynn Quirion,E. Latecia Miler

## 2021-02-26 NOTE — Transfer of Care (Signed)
Immediate Anesthesia Transfer of Care Note  Patient: Caleb Thomas  Procedure(s) Performed: ESOPHAGOGASTRODUODENOSCOPY (EGD) WITH PROPOFOL COLONOSCOPY WITH PROPOFOL POLYPECTOMY  Patient Location: Endoscopy Unit  Anesthesia Type:MAC  Level of Consciousness: awake, alert , oriented and patient cooperative  Airway & Oxygen Therapy: Patient Spontanous Breathing and Patient connected to face mask  Post-op Assessment: Report given to RN and Post -op Vital signs reviewed and stable  Post vital signs: Reviewed and stable  Last Vitals:  Vitals Value Taken Time  BP 94/66 02/26/21 0814  Temp    Pulse 99 02/26/21 0815  Resp 27 02/26/21 0815  SpO2 100 % 02/26/21 0815  Vitals shown include unvalidated device data.  Last Pain:  Vitals:   02/26/21 0702  TempSrc: Oral  PainSc: 0-No pain         Complications: No notable events documented.

## 2021-02-26 NOTE — Op Note (Addendum)
[facility] Patient Name: Caleb Thomas Procedure Date: 02/26/2021 MRN: [id number] Attending MD: Carol Ada , MD Date of Birth: [birth year] CSN: [id number] Age: 60 Admit Type: Outpatient Procedure:                Upper GI endoscopy Indications:              Dysphagia Providers:                Carol Ada, MD, Jaci Carrel, RN, Luan Moore, Technician, Dellie Catholic Referring MD:              Medicines:                 Complications:            No immediate complications. Estimated Blood Loss:     Estimated blood loss: none. Procedure:                Pre-Anesthesia Assessment:                           - Prior to the procedure, a History and Physical                            was performed, and patient medications and                            allergies were reviewed. The patient's tolerance of                            previous anesthesia was also reviewed. The risks                            and benefits of the procedure and the sedation                            options and risks were discussed with the patient.                            All questions were answered, and informed consent                            was obtained. Prior Anticoagulants: The patient has                            taken Lovenox (enoxaparin), last dose was 1 day                            prior to procedure. ASA Grade Assessment: III - A                            patient with severe systemic disease. After                            reviewing  the risks and benefits, the patient was                            deemed in satisfactory condition to undergo the                            procedure.                           - Sedation was administered by an anesthesia                            professional. Deep sedation was attained.                           After obtaining informed consent, the endoscope was                            passed under  direct vision. Throughout the                            procedure, the patient's blood pressure, pulse, and                            oxygen saturations were monitored continuously. The                            [id number]-[id number] ([id number]) Olympus endoscope was introduced                            through the mouth, and advanced to the second part                            of duodenum. The upper GI endoscopy was                            accomplished without difficulty. The patient                            tolerated the procedure well. Scope In: Scope Out: Findings:      LA Grade D (one or more mucosal breaks involving at least 75% of       esophageal circumference) esophagitis with no bleeding was found 25 to       35 cm from the incisors.      One benign-appearing, intrinsic mild stenosis was found in the lower       third of the esophagus. This stenosis measured 1.1 cm (inner diameter) x       less than one cm (in length). The stenosis was traversed.      A 5 cm hiatal hernia was present.      The gastroesophageal flap valve was visualized endoscopically and       classified as Hill Grade IV (no fold, wide open lumen, hiatal hernia       present).      The stomach was normal.      The examined duodenum  was normal.      In the distal esophagus there was stricture, but the endoscope was able       to pass through easily. This was secondary to the reflux esophagitis.       Dilation was not performed as a result of the severe inflammation. Impression:               - LA Grade D reflux esophagitis with no bleeding.                           - Benign-appearing esophageal stenosis.                           - 5 cm hiatal hernia.                           - Gastroesophageal flap valve classified as Hill                            Grade IV (no fold, wide open lumen, hiatal hernia                            present).                           - Normal stomach.                           -  Normal examined duodenum.                           - No specimens collected. Moderate Sedation:      Not Applicable - Patient had care per Anesthesia. Recommendation:           - Patient has a contact number available for                            emergencies. The signs and symptoms of potential                            delayed complications were discussed with the                            patient. Return to normal activities tomorrow.                            Written discharge instructions were provided to the                            patient.                           - Resume previous diet.                           - Continue present medications.                           -  PPI BID x 2 months.                           - Repeat EGD in 2 months.                           - Follow up in the office in one month.                           - Resume anticoagulation. Procedure Code(s):        --- Professional ---                           [phone], Esophagogastroduodenoscopy, flexible,                            transoral; diagnostic, including collection of                            specimen(s) by brushing or washing, when performed                            (separate procedure) Diagnosis Code(s):        --- Professional ---                           K21.00, Gastro-esophageal reflux disease with                            esophagitis, without bleeding                           K44.9, Diaphragmatic hernia without obstruction or                            gangrene                           R13.10, Dysphagia, unspecified CPT copyright 2019 American Medical Association. All rights reserved. The codes documented in this report are preliminary and upon coder review may  be revised to meet current compliance requirements. Carol Ada, MD Carol Ada, MD 02/26/2021 8:18:32 AM This report has been signed electronically. Number of Addenda: 0

## 2021-02-26 NOTE — Op Note (Addendum)
[facility] Patient Name: Caleb Thomas Procedure Date: 02/26/2021 MRN: [id number] Attending MD: Carol Ada , MD Date of Birth: 02-Oct-1960 CSN: [id number] Age: 60 Admit Type: Outpatient Procedure:                Colonoscopy Indications:              Screening for colorectal malignant neoplasm Providers:                Carol Ada, MD, Jaci Carrel, RN, Luan Moore, Technician, Dellie Catholic Referring MD:              Medicines:                Propofol per Anesthesia Complications:            No immediate complications. Estimated Blood Loss:     Estimated blood loss: none. Procedure:                Pre-Anesthesia Assessment:                           - Prior to the procedure, a History and Physical                            was performed, and patient medications and                            allergies were reviewed. The patient's tolerance of                            previous anesthesia was also reviewed. The risks                            and benefits of the procedure and the sedation                            options and risks were discussed with the patient.                            All questions were answered, and informed consent                            was obtained. Prior Anticoagulants: The patient has                            taken Lovenox (enoxaparin), last dose was 1 day                            prior to procedure. ASA Grade Assessment: III - A                            patient with severe systemic disease. After  reviewing the risks and benefits, the patient was                            deemed in satisfactory condition to undergo the                            procedure.                           - Sedation was administered by an anesthesia                            professional. Deep sedation was attained.                           After obtaining informed consent, the  colonoscope                            was passed under direct vision. Throughout the                            procedure, the patient's blood pressure, pulse, and                            oxygen saturations were monitored continuously. The                            [id number]-HQ190L ([id number]) Olympus colonoscope was                            introduced through the anus and advanced to the the                            cecum, identified by appendiceal orifice and                            ileocecal valve. The colonoscopy was performed                            without difficulty. The patient tolerated the                            procedure well. The quality of the bowel                            preparation was evaluated using the BBPS [facility]                            Bowel Preparation Scale) with scores of: Right                            Colon = 2 (minor amount of residual staining, small  fragments of stool and/or opaque liquid, but mucosa                            seen well), Transverse Colon = 2 (minor amount of                            residual staining, small fragments of stool and/or                            opaque liquid, but mucosa seen well) and Left Colon                            = 2 (minor amount of residual staining, small                            fragments of stool and/or opaque liquid, but mucosa                            seen well). The total BBPS score equals 6. The                            quality of the bowel preparation was good. The                            ileocecal valve, appendiceal orifice, and rectum                            were photographed. Scope In: 7:45:42 AM Scope Out: 8:08:17 AM Scope Withdrawal Time: 0 hours 20 minutes 41 seconds  Total Procedure Duration: 0 hours 22 minutes 35 seconds  Findings:      Six sessile polyps were found in the descending colon, transverse colon,       hepatic flexure and cecum.  The polyps were 2 to 3 mm in size. These       polyps were removed with a cold snare. Resection and retrieval were       complete.      Scattered small and large-mouthed diverticula were found in the sigmoid       colon. Impression:               - Six 2 to 3 mm polyps in the descending colon, in                            the transverse colon, at the hepatic flexure and in                            the cecum, removed with a cold snare. Resected and                            retrieved.                           - Diverticulosis in the sigmoid colon. Moderate Sedation:      Not Applicable - Patient had  care per Anesthesia. Recommendation:           - Patient has a contact number available for                            emergencies. The signs and symptoms of potential                            delayed complications were discussed with the                            patient. Return to normal activities tomorrow.                            Written discharge instructions were provided to the                            patient.                           - Resume previous diet.                           - Continue present medications.                           - Await pathology results.                           - Repeat colonoscopy in 3 years for surveillance.                           - Resume anticoagulation. Procedure Code(s):        --- Professional ---                           [phone], Colonoscopy, flexible; with removal of                            tumor(s), polyp(s), or other lesion(s) by snare                            technique Diagnosis Code(s):        --- Professional ---                           K63.5, Polyp of colon                           Z12.11, Encounter for screening for malignant                            neoplasm of colon                           K57.30, Diverticulosis of large intestine without  perforation or abscess without  bleeding CPT copyright 2019 American Medical Association. All rights reserved. The codes documented in this report are preliminary and upon coder review may  be revised to meet current compliance requirements. Carol Ada, MD Carol Ada, MD 02/26/2021 8:14:22 AM This report has been signed electronically. Number of Addenda: 0

## 2021-03-02 ENCOUNTER — Encounter (HOSPITAL_COMMUNITY): Payer: Self-pay | Admitting: Gastroenterology

## 2021-03-02 LAB — SURGICAL PATHOLOGY

## 2021-03-03 ENCOUNTER — Telehealth: Payer: Self-pay | Admitting: Pharmacist

## 2021-03-03 NOTE — Telephone Encounter (Signed)
Called pt and reminded him to bring new 2023 rx insurance to INR check on 1/5. Previously enrolled in pt assistance programs for his Delene Loll and Wilder Glade in 2022 when he was uninsured. Hopefully copays will be affordable with his new insurance. If not, will need to reapply for pt assistance and see if he still qualifies.

## 2021-03-04 ENCOUNTER — Other Ambulatory Visit: Payer: Self-pay

## 2021-03-04 ENCOUNTER — Ambulatory Visit (INDEPENDENT_AMBULATORY_CARE_PROVIDER_SITE_OTHER): Payer: No Typology Code available for payment source | Admitting: *Deleted

## 2021-03-04 DIAGNOSIS — I2102 ST elevation (STEMI) myocardial infarction involving left anterior descending coronary artery: Secondary | ICD-10-CM | POA: Diagnosis not present

## 2021-03-04 DIAGNOSIS — Z5181 Encounter for therapeutic drug level monitoring: Secondary | ICD-10-CM | POA: Diagnosis not present

## 2021-03-04 LAB — POCT INR: INR: 1.7 — AB (ref 2.0–3.0)

## 2021-03-04 MED ORDER — ENOXAPARIN SODIUM 80 MG/0.8ML IJ SOSY: 80.0000 mg | PREFILLED_SYRINGE | Freq: Two times a day (BID) | INTRAMUSCULAR | 0 refills | Status: DC

## 2021-03-04 MED ORDER — DAPAGLIFLOZIN PROPANEDIOL 10 MG PO TABS: 10.0000 mg | ORAL_TABLET | Freq: Every day | ORAL | 3 refills | Status: DC

## 2021-03-04 MED ORDER — ENTRESTO 97-103 MG PO TABS: 1.0000 | ORAL_TABLET | Freq: Two times a day (BID) | ORAL | 3 refills | Status: DC

## 2021-03-04 NOTE — Telephone Encounter (Addendum)
New insurance scanned in today, rx sent for [place]. Called pharmacy, copays are $135 each (for 3 month supply) and Lovenox rx also sent in today is $95.  Called pt to discuss. He will pick up Lovenox rx but wants to see if he can get enrolled in pt assistance again this year for [place]. Will have him fill out pt assistance applications at his next INR check on Monday.

## 2021-03-04 NOTE — Addendum Note (Signed)
Addended by: Tresten Pantoja E on: 03/04/2021 12:39 PM   Modules accepted: Orders

## 2021-03-04 NOTE — Progress Notes (Signed)
Patient brought in his insurance card. Copay would be $45/month or $112.5/100 day supply at mail order. He asked if he can still fill through pt assistance. I advised that he can try, but it might have reset for 2023. Advised that we can reapply for patient assistance if cost is too much. Patient will let us know.

## 2021-03-04 NOTE — Patient Instructions (Signed)
Description   -Continue to take Lovenox injections. ( 1 injections every 12 hours) -Take 1 tablet of warfarin today. -Then continue taking warfarin 1 tablet daily except 1/2 tablet on Sundays and Thursdays. Follow upcoming procedure instructions.  -Recheck INR in on Monday (03/08/2020). [facility] [phone] Main [phone]

## 2021-03-08 ENCOUNTER — Telehealth: Payer: Self-pay | Admitting: Pharmacist

## 2021-03-08 ENCOUNTER — Other Ambulatory Visit: Payer: Self-pay

## 2021-03-08 ENCOUNTER — Ambulatory Visit (INDEPENDENT_AMBULATORY_CARE_PROVIDER_SITE_OTHER): Payer: No Typology Code available for payment source | Admitting: *Deleted

## 2021-03-08 DIAGNOSIS — Z5181 Encounter for therapeutic drug level monitoring: Secondary | ICD-10-CM

## 2021-03-08 DIAGNOSIS — I2102 ST elevation (STEMI) myocardial infarction involving left anterior descending coronary artery: Secondary | ICD-10-CM

## 2021-03-08 LAB — POCT INR: INR: 2.1 (ref 2.0–3.0)

## 2021-03-08 NOTE — Patient Instructions (Signed)
Description   Stop Lovenox injections. Today take 1.5 tablets then continue taking warfarin 1 tablet daily except 1/2 tablet on Sundays and Thursdays. Recheck INR in 2 weeks. [facility] [phone] Main [phone]

## 2021-03-08 NOTE — Telephone Encounter (Signed)
Patient given patient assistance paperwork for Caleb Thomas and Caleb Thomas to fill out. Patient wanted to bring home to fill out and he said he would bring back in 2 weeks to his next INR check along with his proof of income.

## 2021-03-23 ENCOUNTER — Ambulatory Visit (INDEPENDENT_AMBULATORY_CARE_PROVIDER_SITE_OTHER): Payer: No Typology Code available for payment source

## 2021-03-23 ENCOUNTER — Other Ambulatory Visit: Payer: Self-pay

## 2021-03-23 DIAGNOSIS — I2102 ST elevation (STEMI) myocardial infarction involving left anterior descending coronary artery: Secondary | ICD-10-CM | POA: Diagnosis not present

## 2021-03-23 DIAGNOSIS — Z5181 Encounter for therapeutic drug level monitoring: Secondary | ICD-10-CM

## 2021-03-23 LAB — POCT INR: INR: 2.7 (ref 2.0–3.0)

## 2021-03-23 NOTE — Patient Instructions (Signed)
-   continue taking warfarin 1 tablet daily except 1/2 tablet on Sundays and Thursdays - Recheck INR in 4 weeks.  [facility] [phone] Main [phone]

## 2021-03-29 DIAGNOSIS — K2100 Gastro-esophageal reflux disease with esophagitis, without bleeding: Secondary | ICD-10-CM | POA: Diagnosis not present

## 2021-03-29 DIAGNOSIS — K222 Esophageal obstruction: Secondary | ICD-10-CM | POA: Diagnosis not present

## 2021-03-30 ENCOUNTER — Telehealth: Payer: Self-pay

## 2021-03-30 NOTE — Telephone Encounter (Signed)
**Note De-Identified [name]** Letter received from [facility] stating that they have denied a tier exception for the pts Entresto. Reason: Per Medicare part D for a brand name drug to be covered at a lower tier there must be a brand drug for the treatment of your condition already at that eligible tier. In this case there is none so Caleb Thomas is at the lowest tier possible for a name brand medication.  The letter states that they have notified the pt of this denial as well.

## 2021-04-05 MED ORDER — DAPAGLIFLOZIN PROPANEDIOL 10 MG PO TABS: 10.0000 mg | ORAL_TABLET | Freq: Every day | ORAL | 3 refills | Status: DC

## 2021-04-05 MED ORDER — ENTRESTO 97-103 MG PO TABS: 1.0000 | ORAL_TABLET | Freq: Two times a day (BID) | ORAL | 3 refills | Status: DC

## 2021-04-05 NOTE — Addendum Note (Signed)
Addended by: Marcelle Overlie D on: 04/05/2021 02:28 PM   Modules accepted: Orders

## 2021-04-06 ENCOUNTER — Telehealth: Payer: Self-pay | Admitting: Pharmacist

## 2021-04-06 NOTE — Telephone Encounter (Signed)
Patient assistance application faxed for Caleb Thomas on 04/06/21.

## 2021-04-12 MED ORDER — DAPAGLIFLOZIN PROPANEDIOL 10 MG PO TABS: 10.0000 mg | ORAL_TABLET | Freq: Every day | ORAL | 3 refills | Status: DC

## 2021-04-12 NOTE — Addendum Note (Signed)
**Note De-Identified Jaiyah Beining Obfuscation** Addended by: Dennie Fetters on: 04/12/2021 01:13 PM   Modules accepted: Orders

## 2021-04-12 NOTE — Telephone Encounter (Signed)
**Note De-Identified Clint Biello Obfuscation** Pt approved for Farxiga asst per letter from [facility] and [place] from 05/22/2021 until 05/23/2022. Pt ID-PEP_[id number]  The letter states that they have notified the pt of this approval as well.

## 2021-04-19 ENCOUNTER — Encounter: Payer: Self-pay | Admitting: Internal Medicine

## 2021-04-20 ENCOUNTER — Other Ambulatory Visit: Payer: Self-pay

## 2021-04-20 ENCOUNTER — Ambulatory Visit (INDEPENDENT_AMBULATORY_CARE_PROVIDER_SITE_OTHER): Payer: No Typology Code available for payment source

## 2021-04-20 DIAGNOSIS — Z5181 Encounter for therapeutic drug level monitoring: Secondary | ICD-10-CM | POA: Diagnosis not present

## 2021-04-20 DIAGNOSIS — I2102 ST elevation (STEMI) myocardial infarction involving left anterior descending coronary artery: Secondary | ICD-10-CM

## 2021-04-20 LAB — POCT INR: INR: 2 (ref 2.0–3.0)

## 2021-04-20 NOTE — Patient Instructions (Signed)
Description   Take 1.5 tablets today, then resume same dosage of Warfarin 1 tablet daily except 1/2 tablet on Sundays and Thursdays.  Recheck INR in 4 weeks. [facility] [phone] Main [phone]

## 2021-04-30 ENCOUNTER — Other Ambulatory Visit: Payer: Self-pay | Admitting: Cardiology

## 2021-05-18 ENCOUNTER — Other Ambulatory Visit: Payer: Self-pay

## 2021-05-18 ENCOUNTER — Ambulatory Visit (INDEPENDENT_AMBULATORY_CARE_PROVIDER_SITE_OTHER): Payer: No Typology Code available for payment source | Admitting: *Deleted

## 2021-05-18 DIAGNOSIS — Z5181 Encounter for therapeutic drug level monitoring: Secondary | ICD-10-CM

## 2021-05-18 DIAGNOSIS — I2102 ST elevation (STEMI) myocardial infarction involving left anterior descending coronary artery: Secondary | ICD-10-CM | POA: Diagnosis not present

## 2021-05-18 LAB — POCT INR: INR: 2.8 (ref 2.0–3.0)

## 2021-05-18 NOTE — Patient Instructions (Signed)
Description   ?Continue taking Warfarin 1 tablet daily except 1/2 tablet on Sundays and Thursdays.  Recheck INR in 6 weeks. [facility] [phone] Main [phone] ?  ?  ?

## 2021-05-19 ENCOUNTER — Ambulatory Visit (INDEPENDENT_AMBULATORY_CARE_PROVIDER_SITE_OTHER): Payer: Medicare HMO

## 2021-05-19 DIAGNOSIS — I255 Ischemic cardiomyopathy: Secondary | ICD-10-CM

## 2021-05-20 LAB — CUP PACEART REMOTE DEVICE CHECK
Date Time Interrogation Session: 20230322072105
Implantable Lead Implant Date: 20201210
Implantable Lead Location: 753860
Implantable Lead Model: 436909
Implantable Lead Serial Number: 8102423
Implantable Pulse Generator Implant Date: 20201210
Pulse Gen Model: 429525
Pulse Gen Serial Number: 84745362

## 2021-05-26 ENCOUNTER — Encounter: Payer: Self-pay | Admitting: Medical

## 2021-05-26 ENCOUNTER — Ambulatory Visit (INDEPENDENT_AMBULATORY_CARE_PROVIDER_SITE_OTHER): Payer: No Typology Code available for payment source | Admitting: Medical

## 2021-05-26 VITALS — BP 110/70 | HR 82 | Ht 68.0 in | Wt 201.0 lb

## 2021-05-26 DIAGNOSIS — Z1211 Encounter for screening for malignant neoplasm of colon: Secondary | ICD-10-CM | POA: Diagnosis not present

## 2021-05-26 DIAGNOSIS — Z23 Encounter for immunization: Secondary | ICD-10-CM | POA: Diagnosis not present

## 2021-05-26 DIAGNOSIS — N529 Male erectile dysfunction, unspecified: Secondary | ICD-10-CM

## 2021-05-26 DIAGNOSIS — I2510 Atherosclerotic heart disease of native coronary artery without angina pectoris: Secondary | ICD-10-CM

## 2021-05-26 DIAGNOSIS — R7301 Impaired fasting glucose: Secondary | ICD-10-CM | POA: Diagnosis not present

## 2021-05-26 DIAGNOSIS — I255 Ischemic cardiomyopathy: Secondary | ICD-10-CM

## 2021-05-26 DIAGNOSIS — M25562 Pain in left knee: Secondary | ICD-10-CM

## 2021-05-26 DIAGNOSIS — Z95810 Presence of automatic (implantable) cardiac defibrillator: Secondary | ICD-10-CM | POA: Diagnosis not present

## 2021-05-26 DIAGNOSIS — Z0000 Encounter for general adult medical examination without abnormal findings: Secondary | ICD-10-CM

## 2021-05-26 DIAGNOSIS — Z7189 Other specified counseling: Secondary | ICD-10-CM | POA: Diagnosis not present

## 2021-05-26 DIAGNOSIS — G8929 Other chronic pain: Secondary | ICD-10-CM

## 2021-05-26 DIAGNOSIS — Z125 Encounter for screening for malignant neoplasm of prostate: Secondary | ICD-10-CM

## 2021-05-26 DIAGNOSIS — E7800 Pure hypercholesterolemia, unspecified: Secondary | ICD-10-CM

## 2021-05-26 DIAGNOSIS — I5022 Chronic systolic (congestive) heart failure: Secondary | ICD-10-CM

## 2021-05-26 DIAGNOSIS — I10 Essential (primary) hypertension: Secondary | ICD-10-CM | POA: Diagnosis not present

## 2021-05-26 NOTE — Progress Notes (Signed)
Subjective:  ? ? Caleb Thomas is a 61 y.o. male who presents for Preventative Services visit and chronic medical problems/med check visit.   ? ?Primary Care Provider ?Tysinger, Camelia Eng, PA-C here for primary care ? ?Current Health Care Team: ?Dentist, Dr. dentures ?Eye doctor, [place]- 2 weeks ago ? ?Medical Services you may have received from other than [facility] providers in the past year (date may be approximate) ?Cardio- Dr. Fransico Him ?GI- Dr. Carol Ada ? ?Exercise ?Current exercise habits:  everyday  , walks daily.   ? ?Nutrition/Diet ?Current diet: in general, a "healthy" diet   ? ?Depression Screen ? ?  05/26/2021  ?  2:58 PM  ?Depression screen PHQ 2/9  ?Decreased Interest 0  ?Down, Depressed, Hopeless 0  ?PHQ - 2 Score 0  ? ? ?Activities of Daily Living Screen/Functional Status Survey ?Is the patient deaf or have difficulty hearing?: No ?Does the patient have difficulty seeing, even when wearing glasses/contacts?: No ?Does the patient have difficulty concentrating, remembering, or making decisions?: No ?Does the patient have difficulty walking or climbing stairs?: No ?Does the patient have difficulty dressing or bathing?: No ?Does the patient have difficulty doing errands alone such as visiting a doctor's office or shopping?: No ? ?Can patient draw a clock face showing 3:15 oclock, yes ? ?Fall Risk Screen ? ?  05/26/2021  ?  2:58 PM 11/24/2020  ? 11:45 AM 03/09/2018  ? 11:40 AM 12/01/2016  ? 10:59 AM  ?Fall Risk   ?Falls in the past year? 0 0 0 No  ?Number falls in past yr: 0 0    ?Injury with Fall? 0 0    ?Risk for fall due to : No Fall Risks No Fall Risks    ?Follow up Falls evaluation completed Falls evaluation completed    ? ? ?Gait Assessment: ?Normal gait observed yes ? ?Advanced directives ?Does patient have a [place]? No ?Does patient have a Living Will? No ? ?Past Medical History:  ?Diagnosis Date  ? CAD (coronary artery disease), native coronary artery 04/2018  ?  s/p anterior STEMI with severe single-vessel CAD with occluded mid LAD and faint left-to-left collaterals.  ? Chronic systolic (congestive) heart failure (HCC)   ? Hyperlipidemia   ? Hypertension 2015  ? Ischemic cardiomyopathy   ? EF 25-30% by echo 01/2019 s/p AICD implant  ? LV (left ventricular) mural thrombus   ? noted on cardiac MRI but not on echo with contrast>>on chronic warfarin  ? ? ?Past Surgical History:  ?Procedure Laterality Date  ? COLONOSCOPY  2013  ? "normal" per patient, Angelina Sheriff  ? COLONOSCOPY WITH PROPOFOL N/A 02/26/2021  ? Procedure: COLONOSCOPY WITH PROPOFOL;  Surgeon: Carol Ada, MD;  Location: [facility];  Service: Endoscopy;  Laterality: N/A;  ? ESOPHAGOGASTRODUODENOSCOPY (EGD) WITH PROPOFOL N/A 02/26/2021  ? Procedure: ESOPHAGOGASTRODUODENOSCOPY (EGD) WITH PROPOFOL;  Surgeon: Carol Ada, MD;  Location: [facility];  Service: Endoscopy;  Laterality: N/A;  ? ICD IMPLANT N/A 02/07/2019  ? Procedure: ICD IMPLANT;  Surgeon: Evans Lance, MD;  Location: [facility];  Service: Cardiovascular;  Laterality: N/A;  ? LEFT HEART CATH AND CORONARY ANGIOGRAPHY N/A 05/06/2018  ? Procedure: LEFT HEART CATH AND CORONARY ANGIOGRAPHY;  Surgeon: [name], MD;  Location: [facility];  Service: Cardiovascular;  Laterality: N/A;  ? POLYPECTOMY  02/26/2021  ? Procedure: POLYPECTOMY;  Surgeon: Carol Ada, MD;  Location: [facility];  Service: Endoscopy;;  ? ? ?Social History  ? ?  Socioeconomic History  ? Marital status: Married  ?  Spouse name: Not on file  ? Number of children: Not on file  ? Years of education: Not on file  ? Highest education level: Not on file  ?Occupational History  ? Not on file  ?Tobacco Use  ? Smoking status: Former  ? Smokeless tobacco: Never  ? Tobacco comments:  ?  quit smoking 61 years old ago  ?Vaping Use  ? Vaping Use: Never used  ?Substance and Sexual Activity  ? Alcohol use: Yes  ?  Alcohol/week: 3.0 standard drinks  ?  Types: 3 Cans of beer per  week  ?  Comment: on weekend   ? Drug use: Not on file  ? Sexual activity: Not on file  ?Other Topics Concern  ? Not on file  ?Social History Narrative  ? Married, 10 children, 4 grandchildren.  Former Surveyor, mining at [employer].  Walks for exercise.   Disabled  ?   ? 04/2021  ? ?Social Determinants of Health  ? ?Financial Resource Strain: Not on file  ?Food Insecurity: Not on file  ?Transportation Needs: Not on file  ?Physical Activity: Not on file  ?Stress: Not on file  ?Social Connections: Not on file  ?Intimate Partner Violence: Not on file  ? ? ?Family History  ?Problem Relation Age of Onset  ? Stroke Mother   ? Heart disease Father   ? Hypertension Brother   ? Stroke Brother   ? Hypertension Brother   ? Cancer Neg Hx   ? ? ? ?Current Outpatient Medications:  ?  atorvastatin (LIPITOR) 80 MG tablet, Take 1 tablet by mouth once daily, Disp: 90 tablet, Rfl: 3 ?  clopidogrel (PLAVIX) 75 MG tablet, Take 1 tablet by mouth once daily with breakfast, Disp: 90 tablet, Rfl: 2 ?  dapagliflozin propanediol (FARXIGA) 10 MG TABS tablet, Take 1 tablet (10 mg total) by mouth daily., Disp: 90 tablet, Rfl: 3 ?  metoprolol (TOPROL-XL) 200 MG 24 hr tablet, TAKE 1 TABLET BY MOUTH ONCE DAILY (STOPPING  CARVEDILOL), Disp: 90 tablet, Rfl: 2 ?  omeprazole (PRILOSEC) 40 MG capsule, Take 40 mg by mouth daily., Disp: , Rfl:  ?  sacubitril-valsartan (ENTRESTO) 97-103 MG, Take 1 tablet by mouth 2 (two) times daily., Disp: 180 tablet, Rfl: 3 ?  spironolactone (ALDACTONE) 25 MG tablet, Take 1 tablet (25 mg total) by mouth daily., Disp: 90 tablet, Rfl: 3 ?  warfarin (COUMADIN) 5 MG tablet, TAKE AS DIRECTED BY COUMADIN CLINIC (Patient taking differently: Take 2.5-5 mg by mouth See admin instructions. TAKE AS DIRECTED BY  COUMADIN  CLINIC 2.5 mg Sun and Thu, 5 mg all other days), Disp: 35 tablet, Rfl: 3 ? ?No Known Allergies ? ?History reviewed: allergies, current medications, past family history, past medical history, past social history,  past surgical history and problem list ? ?Chronic issues discussed: ?Compliant with medicaiton ? ?Acute issues discussed: ?Left knee pain x a few months.  No injury or trauma ? ?Objective:  ? ?  ?Biometrics ?BP 110/70   Pulse 82   Ht '5\' 8"'$  (1.727 m)   Wt 201 lb (91.2 kg)   BMI 30.56 kg/m?  ? ?Gen: wd, wn, nad, African American male ?HEENT: normocephalic, sclerae anicteric, TMs pearly, nares patent, no discharge or erythema, pharynx normal ?Oral cavity: MMM, no lesions ?Neck: supple, no lymphadenopathy, no thyromegaly, no masses, no bruits ?Heart: RRR, normal S1, S2, no murmurs ?Lungs: CTA bilaterally, no wheezes, rhonchi, or rales ?Abdomen: +bs,  soft, non tender, non distended, no masses, no hepatomegaly, no splenomegaly ?Musculoskeletal: Tender over the medial knee over the vastus medialis insertion , but otherwise nontender, no laxity, no pain with Mcmurray, rest of legs unremarkable, rest of UE and LE nontender, no swelling, no obvious deformity ?Extremities: no edema, no cyanosis, no clubbing ?Pulses: 2+ symmetric, upper and lower extremities, normal cap refill ?Neurological: alert, oriented x 3, CN2-12 intact, strength normal upper extremities and lower extremities, sensation normal throughout, DTRs 2+ throughout, no cerebellar signs, gait normal ?Psychiatric: normal affect, behavior normal, pleasant  ?GU: normal male GU, no mass, no hernia, no lymphadenopathy ?Rectal deferred/declined ? ? ? ?Assessment:  ? ?Encounter Diagnoses  ?Name Primary?  ? Encounter for health maintenance examination in adult Yes  ? Initial Medicare annual wellness visit   ? Advance directive discussed with patient   ? Chronic pain of left knee   ? Need for Streptococcus pneumoniae vaccination   ? Screening for prostate cancer   ? Screen for colon cancer   ? Ischemic cardiomyopathy   ? Impaired fasting glucose   ? ICD (implantable cardioverter-defibrillator) in place   ? Pure hypercholesterolemia   ? Essential hypertension   ?  Coronary artery disease involving native coronary artery of native heart without angina pectoris   ? Chronic systolic (congestive) heart failure (HCC)   ? Erectile dysfunction, unspecified erectile dysfunction type

## 2021-05-26 NOTE — Patient Instructions (Addendum)
Please go to [facility] for your left knee xray.   Their hours are 8am - 4:30 pm Monday - Friday.  Take your insurance card with you. ? ?[facility] ?[phone] ? ?[address], [address] ?[place], [place] [address] ? ?[address] ?[place], [place] [address]  ? ? ? ? ?This visit was a preventative care visit, also known as wellness visit or routine physical.   Topics typically include healthy lifestyle, diet, exercise, preventative care, vaccinations, sick and well care, proper use of emergency dept and after hours care, as well as other concerns.   ? ? ?Recommendations: ?Continue to return yearly for your annual wellness and preventative care visits.  This gives [place] a chance to discuss healthy lifestyle, exercise, vaccinations, review your chart record, and perform screenings where appropriate. ? ?I recommend you see your eye doctor yearly for routine vision care. ? ?I recommend you see your dentist yearly for routine dental care including hygiene visits twice yearly. ? ? ?Vaccination recommendations were reviewed ?Immunization History  ?Administered Date(s) Administered  ? Moderna Sars-Covid-2 Vaccination 07/15/2019, 08/12/2019, 03/04/2020  ? PNEUMOCOCCAL CONJUGATE-20 05/26/2021  ? Tdap 07/20/2017  ? ? ?Counseled on the pneumococcal vaccine.  Vaccine information sheet given.  Pneumococcal vaccine Prevnar 20 given after consent obtained. ? ?Check with insurance about updating Shingrix vaccine. ? ?Get your flu shot yearly in the fall. ? ? ? ?Screening for cancer: ?Colon cancer screening: ?I reviewed your colonoscopy on file that is up to date from 01/2021 ? ?We discussed PSA, prostate exam, and prostate cancer screening risks/benefits.    ? ?Skin cancer screening: ?Check your skin regularly for new changes, growing lesions, or other lesions of concern ?Come in for evaluation if you have skin lesions of concern. ? ?Lung cancer screening: ?If you have a greater than 20 pack year history of tobacco  use, then you may qualify for lung cancer screening with a chest CT scan.   Please call your insurance company to inquire about coverage for this test. ? ?We currently don't have screenings for other cancers besides breast, cervical, colon, and lung cancers.  If you have a strong family history of cancer or have other cancer screening concerns, please let me know.  ? ? ?Bone health: ?Get at least 150 minutes of aerobic exercise weekly ?Get weight bearing exercise at least once weekly ?Bone density test:  ?A bone density test is an imaging test that uses a type of X-ray to measure the amount of calcium and other minerals in your bones. ?The test may be used to diagnose or screen you for a condition that causes weak or thin bones (osteoporosis), predict your risk for a broken bone (fracture), or determine how well your osteoporosis treatment is working. ?The bone density test is recommended for females 56 and older, or females or males <75 if certain risk factors such as thyroid disease, long term use of steroids such as for asthma or rheumatological issues, vitamin D deficiency, estrogen deficiency, family history of osteoporosis, self or family history of fragility fracture in first degree relative. ? ? ? ?Heart health: ?Get at least 150 minutes of aerobic exercise weekly ?Limit alcohol ?It is important to maintain a healthy blood pressure and healthy cholesterol numbers ? ?Follow up with cardiology as planned ? ? ?Medical care options: ?I recommend you continue to seek care here first for routine care.  We try really hard to have available appointments Monday through Friday daytime hours for sick visits, acute visits,  and physicals.  Urgent care should be used for after hours and weekends for significant issues that cannot wait till the next day.  The emergency department should be used for significant potentially life-threatening emergencies.  The emergency department is expensive, can often have long wait times  for less significant concerns, so try to utilize primary care, urgent care, or telemedicine when possible to avoid unnecessary trips to the emergency department.  Virtual visits and telemedicine have been introduced since the pandemic started in 2020, and can be convenient ways to receive medical care.  We offer virtual appointments as well to assist you in a variety of options to seek medical care. ? ? ?Advanced Directives: ?I recommend you consider completing a [place] and Living Will.   These documents respect your wishes and help alleviate burdens on your loved ones if you were to become terminally ill or be in a position to need those documents enforced.   ? ?You can complete Advanced Directives yourself, have them notarized, then have copies made for our office, for you and for anybody you feel should have them in safe keeping. ? ?Or, you can have an attorney prepare these documents.   If you haven't updated your Last Will and Testament in a while, it may be worthwhile having an attorney prepare these documents together and save on some costs.    ? ? ? ?Separate significant issues discussed: ?Left knee pain-go for x-ray.  I suspect some arthritis.  No obvious laxity of the joint line.  Advised Tylenol over-the-counter as needed or topical Aspercreme for comfort ? ?Impaired glucose-updated labs today ? ?Congestive heart failure chronic, CAD, hypertension, ICD in place-managed by cardiology ? ?

## 2021-05-28 ENCOUNTER — Other Ambulatory Visit: Payer: Self-pay | Admitting: Cardiology

## 2021-05-31 ENCOUNTER — Ambulatory Visit
Admission: RE | Admit: 2021-05-31 | Discharge: 2021-05-31 | Disposition: A | Discharge status: Home / Self Care | Payer: No Typology Code available for payment source | Source: Ambulatory Visit | Attending: Medical | Admitting: Medical

## 2021-05-31 DIAGNOSIS — G8929 Other chronic pain: Secondary | ICD-10-CM

## 2021-06-01 ENCOUNTER — Other Ambulatory Visit: Payer: Self-pay | Admitting: Medical

## 2021-06-01 DIAGNOSIS — E7800 Pure hypercholesterolemia, unspecified: Secondary | ICD-10-CM

## 2021-06-01 DIAGNOSIS — Z131 Encounter for screening for diabetes mellitus: Secondary | ICD-10-CM

## 2021-06-01 DIAGNOSIS — R7301 Impaired fasting glucose: Secondary | ICD-10-CM

## 2021-06-01 DIAGNOSIS — I10 Essential (primary) hypertension: Secondary | ICD-10-CM

## 2021-06-02 NOTE — Progress Notes (Signed)
Remote ICD transmission.   

## 2021-06-03 ENCOUNTER — Other Ambulatory Visit: Payer: No Typology Code available for payment source

## 2021-06-03 DIAGNOSIS — Z131 Encounter for screening for diabetes mellitus: Secondary | ICD-10-CM | POA: Diagnosis not present

## 2021-06-03 DIAGNOSIS — I10 Essential (primary) hypertension: Secondary | ICD-10-CM | POA: Diagnosis not present

## 2021-06-03 DIAGNOSIS — E7800 Pure hypercholesterolemia, unspecified: Secondary | ICD-10-CM | POA: Diagnosis not present

## 2021-06-03 DIAGNOSIS — R7301 Impaired fasting glucose: Secondary | ICD-10-CM

## 2021-06-04 LAB — COMPREHENSIVE METABOLIC PANEL
ALT: 30 IU/L (ref 0–44)
AST: 26 IU/L (ref 0–40)
Albumin/Globulin Ratio: 1.3 (ref 1.2–2.2)
Albumin: 4 g/dL (ref 3.8–4.8)
Alkaline Phosphatase: 82 IU/L (ref 44–121)
BUN/Creatinine Ratio: 14 (ref 10–24)
BUN: 16 mg/dL (ref 8–27)
Bilirubin Total: 0.6 mg/dL (ref 0.0–1.2)
CO2: 20 mmol/L (ref 20–29)
Calcium: 8.6 mg/dL (ref 8.6–10.2)
Chloride: 109 mmol/L — ABNORMAL HIGH (ref 96–106)
Creatinine, Ser: 1.14 mg/dL (ref 0.76–1.27)
Globulin, Total: 3.1 g/dL (ref 1.5–4.5)
Glucose: 89 mg/dL (ref 70–99)
Potassium: 4.2 mmol/L (ref 3.5–5.2)
Sodium: 144 mmol/L (ref 134–144)
Total Protein: 7.1 g/dL (ref 6.0–8.5)
eGFR: 72 mL/min/{1.73_m2} (ref >59)

## 2021-06-04 LAB — CBC
Hematocrit: 50.5 % (ref 37.5–51.0)
Hemoglobin: 17 g/dL (ref 13.0–17.7)
MCH: 26.8 pg (ref 26.6–33.0)
MCHC: 33.7 g/dL (ref 31.5–35.7)
MCV: 80 fL (ref 79–97)
Platelets: 250 10*3/uL (ref 150–450)
RBC: 6.34 x10E6/uL — ABNORMAL HIGH (ref 4.14–5.80)
RDW: 16.9 % — ABNORMAL HIGH (ref 11.6–15.4)
WBC: 5.8 10*3/uL (ref 3.4–10.8)

## 2021-06-04 LAB — HEMOGLOBIN A1C
Est. average glucose Bld gHb Est-mCnc: 126 mg/dL
Hgb A1c MFr Bld: 6 % — ABNORMAL HIGH (ref 4.8–5.6)

## 2021-06-07 ENCOUNTER — Telehealth: Payer: Self-pay | Admitting: Cardiology

## 2021-06-07 ENCOUNTER — Ambulatory Visit (INDEPENDENT_AMBULATORY_CARE_PROVIDER_SITE_OTHER): Payer: No Typology Code available for payment source | Admitting: Family Medicine

## 2021-06-07 ENCOUNTER — Encounter: Payer: Self-pay | Admitting: Family Medicine

## 2021-06-07 VITALS — BP 116/72 | HR 59 | Temp 96.8°F | Wt 202.6 lb

## 2021-06-07 DIAGNOSIS — G8929 Other chronic pain: Secondary | ICD-10-CM

## 2021-06-07 DIAGNOSIS — M25562 Pain in left knee: Secondary | ICD-10-CM

## 2021-06-07 NOTE — Telephone Encounter (Signed)
? ?  Primary Cardiologist: Fransico Him, MD ? ?Chart reviewed as part of pre-operative protocol coverage for Caleb Thomas. ? ?Per Clinical pharmacists, Patient with diagnosis of LV thrombus on warfarin for anticoagulation.   ?  ?Procedure: knee joint injection ?Date of procedure: 06/07/21 ?  ?We do not usually recommend holding warfarin for knee injections ? ? ?I will route this recommendation to the requesting party via Epic fax function and remove from pre-op pool. ? ?Please call with questions. ? ?Emmaline Life, NP-C ? ?  ?06/07/2021, 12:54 PM ?[facility] ?[address] ?Office [phone] Fax [phone] ? ? ? ?

## 2021-06-07 NOTE — Telephone Encounter (Signed)
Per Gabriel Cirri at [facility] pt's Steroid Injection has been rescheduled for 06/14/21. Please advise ?

## 2021-06-07 NOTE — Telephone Encounter (Signed)
? ?  Pre-operative Risk Assessment  ?  ?Patient Name: Caleb Thomas  ?DOB: [birth year] ?MRN: [id number]  ? ?  ? ?Request for Surgical Clearance   ? ?Procedure:  Medical Knee Joint Injection ? ?Date of Surgery:  Clearance 06/07/21                              ?   ?Surgeon:  Dr. Louanne Belton ?Surgeon's Group or Practice Name:  [facility] ?Phone number:  [phone] ?Fax number:  [phone] ?  ?Type of Clearance Requested:   ?- Pharmacy:  Hold Warfarin (Coumadin) up to cardiology ?  ?Type of Anesthesia:  None  ?  ?Additional requests/questions:  Please advise surgeon/provider what medications should be held. ? ?Signed, ?Belisicia T Harris   ?06/07/2021, 8:38 AM  ? ?

## 2021-06-07 NOTE — Telephone Encounter (Signed)
Patient with diagnosis of LV thrombus on warfarin for anticoagulation.   ? ?Procedure: knee joint injection ?Date of procedure: 06/07/21 ? ?We do not usually recommend holding warfarin for knee injections ?   ? ? ?

## 2021-06-07 NOTE — Telephone Encounter (Signed)
Primary Cardiologist:Traci Radford Pax, MD ? ?Chart reviewed as part of pre-operative protocol coverage.  Caleb Thomas's past medical history includes chronic warfarin therapy for LV mural thrombus.   ? ?This message will be routed to pharmacy pool for input on holding anticoagulant agent as requested below so that this information is available at time of patient's appointment.  ? ? ?Emmaline Life, NP-C ? ?  ?06/07/2021, 9:20 AM ?[facility] ?[address], [address] ?Office [phone] Fax [phone] ? ?

## 2021-06-07 NOTE — Progress Notes (Signed)
? ?  Subjective:  ? ? Patient ID: Caleb Thomas, male    DOB: 05/29/1960, 61 y.o.   MRN: [id number] ? ?HPI ?He is here for consult concerning left knee pain.  Review of his record indicates that he indeed does have degenerative disease present indicating probable eventual need for replacement.  It is interfering with his ADLs.  He is interested in a steroid injection.  He is presently on Tylenol for pain relief.  He is also taking Coumadin for cardiomyopathy. ? ? ?Review of Systems ? ?   ?Objective:  ? Physical Exam ?Exam of the left knee does show a minimal effusion with tenderness proximal to the medial joint line.  Negative anterior drawer.  McMurray's testing was slightly uncomfortable. ? ? ? ?   ?Assessment & Plan:  ?Chronic pain of left knee ?Discussed proper care of this including steroid injection and possibly viscosupplementation.  I explained that this is essentially based on how much pain he is having and that we are limited as to what we could use.  He is interested in pursuing an injection.  I left a message for Dr. Radford Pax concerning the need for stopping the Coumadin versus going ahead and giving them the shot.  I will wait for her to send me a message. ? ?

## 2021-06-14 ENCOUNTER — Ambulatory Visit (INDEPENDENT_AMBULATORY_CARE_PROVIDER_SITE_OTHER): Payer: No Typology Code available for payment source | Admitting: Family Medicine

## 2021-06-14 ENCOUNTER — Encounter: Payer: Self-pay | Admitting: Family Medicine

## 2021-06-14 VITALS — BP 120/76 | HR 63 | Temp 95.7°F | Wt 202.4 lb

## 2021-06-14 DIAGNOSIS — M25562 Pain in left knee: Secondary | ICD-10-CM

## 2021-06-14 DIAGNOSIS — G8929 Other chronic pain: Secondary | ICD-10-CM

## 2021-06-14 MED ORDER — TRIAMCINOLONE ACETONIDE 40 MG/ML IJ SUSP
40.0000 mg | Freq: Once | INTRAMUSCULAR | Status: AC
  Administered 2021-06-14: 40 mg via INTRAMUSCULAR

## 2021-06-14 MED ORDER — LIDOCAINE HCL 1 % IJ SOLN
10.0000 mL | Freq: Once | INTRAMUSCULAR | Status: AC
  Administered 2021-06-14: 3 mL via INTRADERMAL

## 2021-06-14 NOTE — Addendum Note (Signed)
Addended by: Elyse Jarvis on: 06/14/2021 08:55 AM ? ? Modules accepted: Orders ? ?

## 2021-06-14 NOTE — Progress Notes (Signed)
? ?  Subjective:  ? ? Patient ID: Caleb Thomas, male    DOB: 05-Feb-1961, 61 y.o.   MRN: [id number] ? ?HPI ?He is here for a left knee injection.  He does have a history of chronic knee pain with arthritis.  He also is on Coumadin. ? ? ?Review of Systems ? ?   ?Objective:  ? Physical Exam ? ?Left knee not examined ? ? ?   ?Assessment & Plan:  ?Chronic pain of left knee ?The left knee was prepped laterally with Betadine.  40 mg of Aristocort and 3 cc of Xylocaine was injected into the joint without difficulty.  He did start to obtain relatively quick relief of his pain.  Then explained the concept of steroid flare with him. ? ?

## 2021-06-17 ENCOUNTER — Telehealth: Payer: Self-pay | Admitting: Cardiology

## 2021-06-17 NOTE — Telephone Encounter (Signed)
Pt wants to know what he is able to take for his allergies. Please advise ?

## 2021-06-17 NOTE — Telephone Encounter (Signed)
2nd generation antihistamine like Claritin, Zyrtec or Allergra would all be fine. ?

## 2021-06-17 NOTE — Telephone Encounter (Signed)
Advised patient on allergy medication recommendations. Patient verbalized understanding.  ?

## 2021-06-29 ENCOUNTER — Ambulatory Visit (INDEPENDENT_AMBULATORY_CARE_PROVIDER_SITE_OTHER): Payer: No Typology Code available for payment source

## 2021-06-29 DIAGNOSIS — I2102 ST elevation (STEMI) myocardial infarction involving left anterior descending coronary artery: Secondary | ICD-10-CM

## 2021-06-29 DIAGNOSIS — Z5181 Encounter for therapeutic drug level monitoring: Secondary | ICD-10-CM | POA: Diagnosis not present

## 2021-06-29 LAB — POCT INR: INR: 2.4 (ref 2.0–3.0)

## 2021-06-29 NOTE — Patient Instructions (Signed)
Description   ?Continue taking Warfarin 1 tablet daily except 1/2 tablet on Sundays and Thursdays.  Recheck INR in 6 weeks. [facility] [phone] Main [phone] ?  ?  ?

## 2021-07-08 ENCOUNTER — Other Ambulatory Visit: Payer: Self-pay | Admitting: Cardiology

## 2021-07-08 DIAGNOSIS — I513 Intracardiac thrombosis, not elsewhere classified: Secondary | ICD-10-CM

## 2021-07-09 NOTE — Telephone Encounter (Signed)
Prescription refill request received for warfarin ?Lov: 12/31/20 Radford Pax)  ?Next INR check: 08/10/21 ?Warfarin tablet strength: '5mg'$  ? ?Appropriate dose and refill sent to requested pharmacy.  ?

## 2021-08-03 ENCOUNTER — Ambulatory Visit (INDEPENDENT_AMBULATORY_CARE_PROVIDER_SITE_OTHER): Payer: No Typology Code available for payment source | Admitting: Medical

## 2021-08-03 ENCOUNTER — Encounter: Payer: Self-pay | Admitting: Medical

## 2021-08-03 VITALS — BP 130/88 | HR 84 | Ht 68.0 in | Wt 205.0 lb

## 2021-08-03 DIAGNOSIS — H6983 Other specified disorders of Eustachian tube, bilateral: Secondary | ICD-10-CM

## 2021-08-03 DIAGNOSIS — R42 Dizziness and giddiness: Secondary | ICD-10-CM | POA: Diagnosis not present

## 2021-08-03 MED ORDER — MECLIZINE HCL 25 MG PO TABS: 25.0000 mg | ORAL_TABLET | Freq: Two times a day (BID) | ORAL | 0 refills | Status: DC

## 2021-08-03 NOTE — Progress Notes (Signed)
Subjective:  Caleb Thomas is a 61 y.o. male who presents for Chief Complaint  Patient presents with   Dizziness    Having some dizziness if he is laying in the bed and he slides over (spinning) also if he bends over.      Here for dizziness/room spinning sensation.  If lying in the bed, if turning or moving feels head spinning . Gets same sensation behind over from standing position.   Started 2-3 days ago.  Episodes last for several seconds or so.  No fever, no headache, no ringing in the ears, no hearing loss, no palpitations, no edema, no shortness of breath, no slurred speech, no confusion.  He may have had something similar before but is not sure.  No bleeding anywhere.  Using nothing for symptoms.  No recent fall or head injury.  No blurred vision or vision change.  No other aggravating or relieving factors.    No other c/o.  Past Medical History:  Diagnosis Date   CAD (coronary artery disease), native coronary artery 04/2018   s/p anterior STEMI with severe single-vessel CAD with occluded mid LAD and faint left-to-left collaterals.   Chronic systolic (congestive) heart failure (HCC)    Hyperlipidemia    Hypertension 2015   Ischemic cardiomyopathy    EF 25-30% by echo 01/2019 s/p AICD implant   LV (left ventricular) mural thrombus    noted on cardiac MRI but not on echo with contrast>>on chronic warfarin   Current Outpatient Medications on File Prior to Visit  Medication Sig Dispense Refill   atorvastatin (LIPITOR) 80 MG tablet Take 1 tablet by mouth once daily 90 tablet 2   clopidogrel (PLAVIX) 75 MG tablet Take 1 tablet by mouth once daily with breakfast 90 tablet 1   dapagliflozin propanediol (FARXIGA) 10 MG TABS tablet Take 1 tablet (10 mg total) by mouth daily. 90 tablet 3   metoprolol (TOPROL-XL) 200 MG 24 hr tablet TAKE 1 TABLET BY MOUTH ONCE DAILY (STOPPING  CARVEDILOL) 90 tablet 2   Multiple Vitamins-Minerals (CENTRUM ADULT PO) Take 1 tablet by mouth daily.     omeprazole  (PRILOSEC) 40 MG capsule Take 40 mg by mouth daily.     sacubitril-valsartan (ENTRESTO) 97-103 MG Take 1 tablet by mouth 2 (two) times daily. 180 tablet 3   spironolactone (ALDACTONE) 25 MG tablet Take 1 tablet (25 mg total) by mouth daily. 90 tablet 3   warfarin (COUMADIN) 5 MG tablet Take 0.5-1 tablets (2.5-5 mg total) by mouth See admin instructions. Take 0.5 tablet to 1 tablet daily or as directed by Coumadin Clinic 35 tablet 2   No current facility-administered medications on file prior to visit.    The following portions of the patient's history were reviewed and updated as appropriate: allergies, current medications, past family history, past medical history, past social history, past surgical history and problem list.  ROS Otherwise as in subjective above  Objective: BP 130/88 (BP Location: Right Arm, Cuff Size: Normal)   Pulse 84   Ht '5\' 8"'$  (1.727 m)   Wt 205 lb (93 kg)   BMI 31.17 kg/m   Wt Readings from Last 3 Encounters:  08/03/21 205 lb (93 kg)  06/14/21 202 lb 6.4 oz (91.8 kg)  06/07/21 202 lb 9.6 oz (91.9 kg)   General appearance: alert, no distress, well developed, well nourished HEENT: normocephalic, sclerae anicteric, conjunctiva pink and moist, bilateral TMs with some retracted appearance and opaque coloration, right nares with swollen turbinates and clear discharge,  pharynx normal Oral cavity: MMM, no lesions Neck: supple, no lymphadenopathy, no thyromegaly, no masses Heart: RRR, normal S1, S2, no murmurs Lungs: CTA bilaterally, no wheezes, rhonchi, or rales Pulses: 2+ radial pulses, 2+ pedal pulses, normal cap refill Ext: no edema Neuro: CN II through XII intact, nonfocal exam   Assessment: Encounter Diagnoses  Name Primary?   Vertigo Yes   Dizziness    Dysfunction of both eustachian tubes      Plan: Discussed symptoms and concerns and differential.  No other more worrisome likely, seems to be BPPV related to some eustachian tube dysfunction.   Symptoms suggest vertigo related to some inner ear eustachian tube dysfunction.  Hydrate well, avoid sudden motions, begin meclizine trial twice a day for the next several days.  If not much improved within the next 3 to 4 days then call back.  Do not drive is too dizzy.  [name] was seen today for dizziness.  Diagnoses and all orders for this visit:  Vertigo  Dizziness  Dysfunction of both eustachian tubes  Other orders -     meclizine (ANTIVERT) 25 MG tablet; Take 1 tablet (25 mg total) by mouth 2 (two) times daily.    Follow up: prn

## 2021-08-03 NOTE — Patient Instructions (Signed)
Vertigo Vertigo is the feeling that you or the things around you are moving when they are not. This feeling can come and go at any time. Vertigo often goes away on its own. This condition can be dangerous if it happens when you are doing activities like driving or working with machines. Your doctor will do tests to find the cause of your vertigo. These tests will also help your doctor decide on the best treatment for you. Follow these instructions at home: Eating and drinking     Drink enough fluid to keep your pee (urine) pale yellow. Do not drink alcohol. Activity Return to your normal activities when your doctor says that it is safe. In the morning, first sit up on the side of the bed. When you feel okay, stand slowly while you hold onto something until you know that your balance is fine. Move slowly. Avoid sudden body or head movements or certain positions, as told by your doctor. Use a cane if you have trouble standing or walking. Sit down right away if you feel dizzy. Avoid doing any tasks or activities that can cause danger to you or others if you get dizzy. Avoid bending down if you feel dizzy. Place items in your home so that they are easy for you to reach without bending or leaning over. Do not drive or use machinery if you feel dizzy. General instructions Take over-the-counter and prescription medicines only as told by your doctor. Keep all follow-up visits. Contact a doctor if: Your medicine does not help your vertigo. Your problems get worse or you have new symptoms. You have a fever. You feel like you may vomit (nauseous), or this feeling gets worse. You start to vomit. Your family or friends see changes in how you act. You lose feeling (have numbness) in part of your body. You feel prickling and tingling in a part of your body. Get help right away if: You are always dizzy. You faint. You get very bad headaches. You get a stiff neck. Bright light starts to bother  you. You have trouble moving or talking. You feel weak in your hands, arms, or legs. You have changes in your hearing or in how you see (vision). These symptoms may be an emergency. Get help right away. Call your local emergency services (911 in the [place] Do not wait to see if the symptoms will go away. Do not drive yourself to the hospital. Summary Vertigo is the feeling that you or the things around you are moving when they are not. Your doctor will do tests to find the cause of your vertigo. You may be told to avoid some tasks, positions, or movements. Contact a doctor if your medicine is not helping, or if you have a fever, new symptoms, or a change in how you act. Get help right away if you get very bad headaches, or if you have changes in how you speak, hear, or see. This information is not intended to replace advice given to you by your health care provider. Make sure you discuss any questions you have with your health care provider. Document Revised: 01/15/2020 Document Reviewed: 01/15/2020 Elsevier Patient Education  2023 Elsevier Inc.  

## 2021-08-10 ENCOUNTER — Ambulatory Visit (INDEPENDENT_AMBULATORY_CARE_PROVIDER_SITE_OTHER): Payer: No Typology Code available for payment source | Admitting: *Deleted

## 2021-08-10 DIAGNOSIS — I2102 ST elevation (STEMI) myocardial infarction involving left anterior descending coronary artery: Secondary | ICD-10-CM

## 2021-08-10 DIAGNOSIS — Z5181 Encounter for therapeutic drug level monitoring: Secondary | ICD-10-CM

## 2021-08-10 LAB — POCT INR: INR: 2.1 (ref 2.0–3.0)

## 2021-08-10 NOTE — Patient Instructions (Signed)
Description   Continue taking Warfarin 1 tablet daily except 1/2 tablet on Sundays and Thursdays.  Recheck INR in 6 weeks. [facility] [phone] Main [phone]

## 2021-08-13 ENCOUNTER — Other Ambulatory Visit: Payer: Self-pay | Admitting: Medical

## 2021-08-18 ENCOUNTER — Ambulatory Visit (INDEPENDENT_AMBULATORY_CARE_PROVIDER_SITE_OTHER): Payer: Medicare HMO

## 2021-08-18 DIAGNOSIS — I255 Ischemic cardiomyopathy: Secondary | ICD-10-CM

## 2021-08-20 LAB — CUP PACEART REMOTE DEVICE CHECK
Date Time Interrogation Session: 20230621142817
Implantable Lead Implant Date: 20201210
Implantable Lead Location: 753860
Implantable Lead Model: 436909
Implantable Lead Serial Number: 8102423
Implantable Pulse Generator Implant Date: 20201210
Pulse Gen Model: 429525
Pulse Gen Serial Number: 84745362

## 2021-09-01 NOTE — Progress Notes (Signed)
Remote ICD transmission.   

## 2021-09-21 ENCOUNTER — Ambulatory Visit (INDEPENDENT_AMBULATORY_CARE_PROVIDER_SITE_OTHER): Payer: No Typology Code available for payment source

## 2021-09-21 DIAGNOSIS — I2102 ST elevation (STEMI) myocardial infarction involving left anterior descending coronary artery: Secondary | ICD-10-CM

## 2021-09-21 DIAGNOSIS — Z5181 Encounter for therapeutic drug level monitoring: Secondary | ICD-10-CM

## 2021-09-21 LAB — POCT INR: INR: 2 (ref 2.0–3.0)

## 2021-09-21 NOTE — Patient Instructions (Signed)
Description   Take 1.5 tablets today, then resume same dosage of Warfarin 1 tablet daily except 1/2 tablet on Sundays and Thursdays.  Recheck INR in 6 weeks. [facility] [phone] Main [phone]

## 2021-09-24 ENCOUNTER — Other Ambulatory Visit: Payer: Self-pay | Admitting: Cardiology

## 2021-09-24 DIAGNOSIS — I513 Intracardiac thrombosis, not elsewhere classified: Secondary | ICD-10-CM

## 2021-11-02 ENCOUNTER — Ambulatory Visit: Payer: No Typology Code available for payment source | Attending: Cardiology

## 2021-11-02 DIAGNOSIS — Z5181 Encounter for therapeutic drug level monitoring: Secondary | ICD-10-CM

## 2021-11-02 DIAGNOSIS — I2102 ST elevation (STEMI) myocardial infarction involving left anterior descending coronary artery: Secondary | ICD-10-CM

## 2021-11-02 LAB — POCT INR: INR: 2 (ref 2.0–3.0)

## 2021-11-02 NOTE — Patient Instructions (Signed)
resume same dosage of Warfarin 1 tablet daily except 1/2 tablet on Sundays and Thursdays.  Recheck INR in 6 weeks. [facility] [phone] Main [phone]

## 2021-11-04 DIAGNOSIS — I130 Hypertensive heart and chronic kidney disease with heart failure and stage 1 through stage 4 chronic kidney disease, or unspecified chronic kidney disease: Secondary | ICD-10-CM | POA: Diagnosis not present

## 2021-11-04 DIAGNOSIS — I509 Heart failure, unspecified: Secondary | ICD-10-CM | POA: Diagnosis not present

## 2021-11-04 DIAGNOSIS — I700 Atherosclerosis of aorta: Secondary | ICD-10-CM | POA: Diagnosis not present

## 2021-11-04 DIAGNOSIS — Z6830 Body mass index (BMI) 30.0-30.9, adult: Secondary | ICD-10-CM | POA: Diagnosis not present

## 2021-11-04 DIAGNOSIS — I2510 Atherosclerotic heart disease of native coronary artery without angina pectoris: Secondary | ICD-10-CM | POA: Diagnosis not present

## 2021-11-04 DIAGNOSIS — E669 Obesity, unspecified: Secondary | ICD-10-CM | POA: Diagnosis not present

## 2021-11-04 DIAGNOSIS — Z95810 Presence of automatic (implantable) cardiac defibrillator: Secondary | ICD-10-CM | POA: Diagnosis not present

## 2021-11-04 DIAGNOSIS — N182 Chronic kidney disease, stage 2 (mild): Secondary | ICD-10-CM | POA: Diagnosis not present

## 2021-11-04 DIAGNOSIS — Z008 Encounter for other general examination: Secondary | ICD-10-CM | POA: Diagnosis not present

## 2021-11-04 DIAGNOSIS — F17211 Nicotine dependence, cigarettes, in remission: Secondary | ICD-10-CM | POA: Diagnosis not present

## 2021-11-04 DIAGNOSIS — E261 Secondary hyperaldosteronism: Secondary | ICD-10-CM | POA: Diagnosis not present

## 2021-11-04 DIAGNOSIS — K219 Gastro-esophageal reflux disease without esophagitis: Secondary | ICD-10-CM | POA: Diagnosis not present

## 2021-11-04 DIAGNOSIS — E785 Hyperlipidemia, unspecified: Secondary | ICD-10-CM | POA: Diagnosis not present

## 2021-11-04 DIAGNOSIS — Z7901 Long term (current) use of anticoagulants: Secondary | ICD-10-CM | POA: Diagnosis not present

## 2021-11-17 ENCOUNTER — Ambulatory Visit (INDEPENDENT_AMBULATORY_CARE_PROVIDER_SITE_OTHER): Payer: Medicare HMO

## 2021-11-17 DIAGNOSIS — I255 Ischemic cardiomyopathy: Secondary | ICD-10-CM

## 2021-11-18 LAB — CUP PACEART REMOTE DEVICE CHECK
Date Time Interrogation Session: 20230921081238
Implantable Lead Implant Date: 20201210
Implantable Lead Location: 753860
Implantable Lead Model: 436909
Implantable Lead Serial Number: 8102423
Implantable Pulse Generator Implant Date: 20201210
Pulse Gen Model: 429525
Pulse Gen Serial Number: 84745362

## 2021-11-22 ENCOUNTER — Telehealth: Payer: Self-pay | Admitting: Internal Medicine

## 2021-11-22 NOTE — Telephone Encounter (Signed)
Pt was notified.  

## 2021-11-22 NOTE — Telephone Encounter (Signed)
Pt has a cold and pt is wanting to take Coricidin HBP but he was advised to call since he is taking coudamin. Can he take it

## 2021-11-30 NOTE — Progress Notes (Signed)
Remote ICD transmission.   

## 2021-12-07 ENCOUNTER — Encounter: Payer: Self-pay | Admitting: Internal Medicine

## 2021-12-13 NOTE — Progress Notes (Signed)
Electrophysiology Office Note Date: 12/20/2021  ID:  Caleb Thomas, DOB 01-Oct-1960, MRN [id number]  PCP: Carlena Hurl, PA-C Primary Cardiologist: Fransico Him, MD Electrophysiologist: Cristopher Peru, MD   CC: Routine ICD follow-up  Caleb Thomas is a 61 y.o. male seen today for Cristopher Peru, MD for routine electrophysiology followup. Since last being seen in our clinic the patient reports doing very well overall.  he denies chest pain, palpitations, dyspnea, PND, orthopnea, nausea, vomiting, dizziness, syncope, edema, weight gain, or early satiety.     He has not had ICD shocks.   Device History: Biotronik Single Chamber ICD implanted 01/2019 for CHF  Past Medical History:  Diagnosis Date   CAD (coronary artery disease), native coronary artery 04/2018   s/p anterior STEMI with severe single-vessel CAD with occluded mid LAD and faint left-to-left collaterals.   Chronic systolic (congestive) heart failure (HCC)    Hyperlipidemia    Hypertension 2015   Ischemic cardiomyopathy    EF 25-30% by echo 01/2019 s/p AICD implant   LV (left ventricular) mural thrombus    noted on cardiac MRI but not on echo with contrast>>on chronic warfarin   Past Surgical History:  Procedure Laterality Date   COLONOSCOPY  2013   "normal" per patient, [place]   COLONOSCOPY WITH PROPOFOL N/A 02/26/2021   Procedure: COLONOSCOPY WITH PROPOFOL;  Surgeon: Carol Ada, MD;  Location: [facility];  Service: Endoscopy;  Laterality: N/A;   ESOPHAGOGASTRODUODENOSCOPY (EGD) WITH PROPOFOL N/A 02/26/2021   Procedure: ESOPHAGOGASTRODUODENOSCOPY (EGD) WITH PROPOFOL;  Surgeon: Carol Ada, MD;  Location: [facility];  Service: Endoscopy;  Laterality: N/A;   ICD IMPLANT N/A 02/07/2019   Procedure: ICD IMPLANT;  Surgeon: Evans Lance, MD;  Location: [facility];  Service: Cardiovascular;  Laterality: N/A;   LEFT HEART CATH AND CORONARY ANGIOGRAPHY N/A 05/06/2018   Procedure: LEFT HEART CATH AND  CORONARY ANGIOGRAPHY;  Surgeon: [name], MD;  Location: [facility];  Service: Cardiovascular;  Laterality: N/A;   POLYPECTOMY  02/26/2021   Procedure: POLYPECTOMY;  Surgeon: Carol Ada, MD;  Location: [facility];  Service: Endoscopy;;    Current Outpatient Medications  Medication Sig Dispense Refill   atorvastatin (LIPITOR) 80 MG tablet Take 1 tablet by mouth once daily 90 tablet 2   clopidogrel (PLAVIX) 75 MG tablet Take 1 tablet by mouth once daily with breakfast 90 tablet 1   dapagliflozin propanediol (FARXIGA) 10 MG TABS tablet Take 1 tablet (10 mg total) by mouth daily. 90 tablet 3   meclizine (ANTIVERT) 25 MG tablet Take 1 tablet by mouth twice daily (Patient taking differently: daily. Per patient taking once daily) 30 tablet 0   metoprolol (TOPROL-XL) 200 MG 24 hr tablet TAKE 1 TABLET BY MOUTH ONCE DAILY (STOPPING  CARVEDILOL) 90 tablet 2   Multiple Vitamins-Minerals (CENTRUM ADULT PO) Take 1 tablet by mouth daily.     omeprazole (PRILOSEC) 40 MG capsule Take 40 mg by mouth daily.     sacubitril-valsartan (ENTRESTO) 97-103 MG Take 1 tablet by mouth 2 (two) times daily. 180 tablet 3   spironolactone (ALDACTONE) 25 MG tablet Take 1 tablet (25 mg total) by mouth daily. 90 tablet 3   warfarin (COUMADIN) 5 MG tablet Take 1/2 tablet to 1 tablet by mouth daily or as directed by Anticoagulation Clinic. 35 tablet 3   No current facility-administered medications for this visit.    Allergies:   Patient has no known allergies.   Social History: Social History   Socioeconomic  History   Marital status: Married    Spouse name: Not on file   Number of children: Not on file   Years of education: Not on file   Highest education level: Not on file  Occupational History   Not on file  Tobacco Use   Smoking status: Former   Smokeless tobacco: Never   Tobacco comments:    quit smoking 61 years old ago  Vaping Use   Vaping Use: Never used  Substance and Sexual Activity    Alcohol use: Yes    Alcohol/week: 3.0 standard drinks of alcohol    Types: 3 Cans of beer per week    Comment: on weekend    Drug use: Not on file   Sexual activity: Not on file  Other Topics Concern   Not on file  Social History Narrative   Married, 10 children, 4 grandchildren.  Former Surveyor, mining at [employer].  Walks for exercise.   Disabled      04/2021   Social Determinants of Health   Financial Resource Strain: Not on file  Food Insecurity: Not on file  Transportation Needs: Not on file  Physical Activity: Not on file  Stress: Not on file  Social Connections: Not on file  Intimate Partner Violence: Not on file    Family History: Family History  Problem Relation Age of Onset   Stroke Mother    Heart disease Father    Hypertension Brother    Stroke Brother    Hypertension Brother    Cancer Neg Hx     Review of Systems: All other systems reviewed and are otherwise negative except as noted above.   Physical Exam: Vitals:   12/20/21 0928  BP: 126/80  Pulse: 65  SpO2: 96%  Weight: 205 lb 6.4 oz (93.2 kg)  Height: '5\' 8"'$  (1.727 m)     GEN- The patient is well appearing, alert and oriented x 3 today.   HEENT: normocephalic, atraumatic; sclera clear, conjunctiva pink; hearing intact; oropharynx clear; neck supple, no JVP Lymph- no cervical lymphadenopathy Lungs- Clear to ausculation bilaterally, normal work of breathing.  No wheezes, rales, rhonchi Heart- Regular  rate and rhythm, no murmurs, rubs or gallops, PMI not laterally displaced GI- soft, non-tender, non-distended, bowel sounds present, no hepatosplenomegaly Extremities- no clubbing or cyanosis. No peripheral edema; DP/PT/radial pulses 2+ bilaterally MS- no significant deformity or atrophy Skin- warm and dry, no rash or lesion; ICD pocket well healed Psych- euthymic mood, full affect Neuro- strength and sensation are intact  ICD interrogation- reviewed in detail today,  See PACEART  report  EKG:  EKG is ordered today. Personal review of EKG ordered today shows NSR at 65 bpm, stable intervals  Recent Labs: 06/03/2021: ALT 30; BUN 16; Creatinine, Ser 1.14; Hemoglobin 17.0; Platelets 250; Potassium 4.2; Sodium 144   Wt Readings from Last 3 Encounters:  12/20/21 205 lb 6.4 oz (93.2 kg)  08/03/21 205 lb (93 kg)  06/14/21 202 lb 6.4 oz (91.8 kg)     Other studies Reviewed: Additional studies/ records that were reviewed today include: Previous EP office notes.   Assessment and Plan:  1.  Chronic systolic dysfunction s/p Biotronik single chamber ICD  euvolemic today Stable on an appropriate medical regimen Normal ICD function See Pace Art report No changes today  2. CAD Denies s/s ischemia  3. HLD Continue statin  Current medicines are reviewed at length with the patient today.   =  Labs/ tests ordered today include:  Orders Placed This Encounter  Procedures   Basic metabolic panel   CBC   EKG 12-Lead    Disposition:   Follow up with [name] in 12 months    Signed, Shirley Friar, PA-C  12/20/2021 9:32 AM  [facility] [address] [place] [address] [phone] (office) [phone] (fax)

## 2021-12-14 ENCOUNTER — Ambulatory Visit: Payer: No Typology Code available for payment source | Attending: Cardiology

## 2021-12-14 DIAGNOSIS — I2102 ST elevation (STEMI) myocardial infarction involving left anterior descending coronary artery: Secondary | ICD-10-CM

## 2021-12-14 DIAGNOSIS — Z5181 Encounter for therapeutic drug level monitoring: Secondary | ICD-10-CM | POA: Diagnosis not present

## 2021-12-14 LAB — POCT INR: INR: 3 (ref 2.0–3.0)

## 2021-12-14 NOTE — Patient Instructions (Signed)
resume same dosage of Warfarin 1 tablet daily except 1/2 tablet on Sundays and Thursdays.  Recheck INR in 6 weeks. [facility] [phone] Main [phone]

## 2021-12-20 ENCOUNTER — Encounter: Payer: Self-pay | Admitting: Student

## 2021-12-20 ENCOUNTER — Ambulatory Visit: Payer: No Typology Code available for payment source | Attending: Student | Admitting: Student

## 2021-12-20 VITALS — BP 126/80 | HR 65 | Ht 68.0 in | Wt 205.4 lb

## 2021-12-20 DIAGNOSIS — I5022 Chronic systolic (congestive) heart failure: Secondary | ICD-10-CM

## 2021-12-20 DIAGNOSIS — E7800 Pure hypercholesterolemia, unspecified: Secondary | ICD-10-CM

## 2021-12-20 DIAGNOSIS — I255 Ischemic cardiomyopathy: Secondary | ICD-10-CM

## 2021-12-20 LAB — CUP PACEART INCLINIC DEVICE CHECK
Date Time Interrogation Session: 20231023094454
Implantable Lead Implant Date: 20201210
Implantable Lead Location: 753860
Implantable Lead Model: 436909
Implantable Lead Serial Number: 8102423
Implantable Pulse Generator Implant Date: 20201210
Pulse Gen Model: 429525
Pulse Gen Serial Number: 84745362

## 2021-12-20 NOTE — Patient Instructions (Signed)
Medication Instructions:  Your physician recommends that you continue on your current medications as directed. Please refer to the Current Medication list given to you today.  *If you need a refill on your cardiac medications before your next appointment, please call your pharmacy*   Lab Work: TODAY: BMET, CBC  If you have labs (blood work) drawn today and your tests are completely normal, you will receive your results only by: [place] (if you have MyChart) OR A paper copy in the mail If you have any lab test that is abnormal or we need to change your treatment, we will call you to review the results.   Follow-Up: At [facility], you and your health needs are our priority.  As part of our continuing mission to provide you with exceptional heart care, we have created designated Provider Care Teams.  These Care Teams include your primary Cardiologist (physician) and Advanced Practice Providers (APPs -  Physician Assistants and Nurse Practitioners) who all work together to provide you with the care you need, when you need it.   Your next appointment:   1 year(s)  The format for your next appointment:   In Person  Provider:   Cristopher Peru, MD   Important Information About Sugar

## 2021-12-21 LAB — CBC
Hematocrit: 51 % (ref 37.5–51.0)
Hemoglobin: 18.1 g/dL — ABNORMAL HIGH (ref 13.0–17.7)
MCH: 29 pg (ref 26.6–33.0)
MCHC: 35.5 g/dL (ref 31.5–35.7)
MCV: 82 fL (ref 79–97)
Platelets: 232 10*3/uL (ref 150–450)
RBC: 6.25 x10E6/uL — ABNORMAL HIGH (ref 4.14–5.80)
RDW: 15.5 % — ABNORMAL HIGH (ref 11.6–15.4)
WBC: 5.7 10*3/uL (ref 3.4–10.8)

## 2021-12-21 LAB — BASIC METABOLIC PANEL
BUN/Creatinine Ratio: 16 (ref 10–24)
BUN: 21 mg/dL (ref 8–27)
CO2: 21 mmol/L (ref 20–29)
Calcium: 9.4 mg/dL (ref 8.6–10.2)
Chloride: 105 mmol/L (ref 96–106)
Creatinine, Ser: 1.3 mg/dL — ABNORMAL HIGH (ref 0.76–1.27)
Glucose: 102 mg/dL — ABNORMAL HIGH (ref 70–99)
Potassium: 4 mmol/L (ref 3.5–5.2)
Sodium: 139 mmol/L (ref 134–144)
eGFR: 62 mL/min/{1.73_m2} (ref >59)

## 2021-12-26 ENCOUNTER — Other Ambulatory Visit: Payer: Self-pay | Admitting: Cardiology

## 2022-01-03 ENCOUNTER — Telehealth: Payer: Self-pay | Admitting: Cardiology

## 2022-01-03 NOTE — Telephone Encounter (Signed)
Pt c/o medication issue:  1. Name of Medication:   warfarin (COUMADIN) 5 MG tablet    2. How are you currently taking this medication (dosage and times per day)? Take 1/2 tablet to 1 tablet by mouth daily or as directed by Anticoagulation Clinic.   3. Are you having a reaction (difficulty breathing--STAT)? no  4. What is your medication issue? Pharmacy calling because pt is completely out of this medication but they need permission from Dr. to change manufacturers. Please advise.

## 2022-01-03 NOTE — Telephone Encounter (Signed)
Called pharmacy and spoke with [facility]. Approved Wafarin manufacturer changed and placed a note on upcoming Coumadin Clinic appt to monitor.

## 2022-01-15 ENCOUNTER — Other Ambulatory Visit: Payer: Self-pay | Admitting: Cardiology

## 2022-01-17 ENCOUNTER — Telehealth: Payer: Self-pay | Admitting: Cardiology

## 2022-01-17 NOTE — Telephone Encounter (Signed)
Spoke with pt and advised per Dr Radford Pax he should continue both medications as long as he is not having any bleeding issues.  Pt states his pharmacist requested that he confirm both medications need to be taken. Pt confirms he has had no bleeding issues at this time.  Pt is past due follow up with Dr Radford Pax.  Appointment scheduled for 04/19/2021.  Pt verbalizes understanding and agrees with current plan.

## 2022-01-17 NOTE — Telephone Encounter (Signed)
Pt c/o medication issue:  1. Name of Medication:  clopidogrel (PLAVIX) 75 MG tablet warfarin (COUMADIN) 5 MG tablet   2. How are you currently taking this medication (dosage and times per day)?  Plavix 1 tablet daily, warfarin Mon Tues Wed Sat 1 whole tablet, other days half a tablet  3. Are you having a reaction (difficulty breathing--STAT)? no  4. What is your medication issue? Patient calling to verify if he needs to continue both medications.

## 2022-01-25 ENCOUNTER — Other Ambulatory Visit: Payer: Self-pay | Admitting: Cardiology

## 2022-01-25 ENCOUNTER — Ambulatory Visit: Payer: No Typology Code available for payment source | Attending: Cardiology

## 2022-01-25 DIAGNOSIS — I2102 ST elevation (STEMI) myocardial infarction involving left anterior descending coronary artery: Secondary | ICD-10-CM

## 2022-01-25 DIAGNOSIS — Z5181 Encounter for therapeutic drug level monitoring: Secondary | ICD-10-CM | POA: Diagnosis not present

## 2022-01-25 DIAGNOSIS — I513 Intracardiac thrombosis, not elsewhere classified: Secondary | ICD-10-CM

## 2022-01-25 LAB — POCT INR: INR: 1.9 — AB (ref 2.0–3.0)

## 2022-01-25 NOTE — Telephone Encounter (Signed)
Last INR 12/14/21 Lat OV 12/20/21

## 2022-01-25 NOTE — Patient Instructions (Signed)
TAKE 2 TABLETS TODAY ONLY AND THEN  resume same dosage of Warfarin 1 tablet daily except 1/2 tablet on Sundays and Thursdays.  Recheck INR in 4 weeks. [facility] [phone] Main [phone]

## 2022-01-26 ENCOUNTER — Telehealth: Payer: Self-pay

## 2022-01-26 NOTE — Telephone Encounter (Signed)
**Note De-Identified Maryl Blalock Obfuscation** The pts completed [employer] application was left at the office with documents.  I have completed the providers page of his application and have e-mailed all to the nurse working with Dr Radford Pax today so she can obtain her signature, date it, and to then fax all to [employer] at the fax number written on the cover letter included.

## 2022-01-28 NOTE — Telephone Encounter (Signed)
Application signed and faxed to [employer]. Confirmation received that fax was successful.

## 2022-02-16 ENCOUNTER — Ambulatory Visit (INDEPENDENT_AMBULATORY_CARE_PROVIDER_SITE_OTHER): Payer: No Typology Code available for payment source

## 2022-02-16 DIAGNOSIS — I255 Ischemic cardiomyopathy: Secondary | ICD-10-CM

## 2022-02-16 LAB — CUP PACEART REMOTE DEVICE CHECK
Date Time Interrogation Session: 20231220075747
Implantable Lead Connection Status: 753985
Implantable Lead Implant Date: 20201210
Implantable Lead Location: 753860
Implantable Lead Model: 436909
Implantable Lead Serial Number: 8102423
Implantable Pulse Generator Implant Date: 20201210
Pulse Gen Model: 429525
Pulse Gen Serial Number: 84745362

## 2022-02-21 ENCOUNTER — Other Ambulatory Visit: Payer: Self-pay | Admitting: Cardiology

## 2022-02-25 ENCOUNTER — Ambulatory Visit: Payer: No Typology Code available for payment source | Attending: Cardiology | Admitting: *Deleted

## 2022-02-25 DIAGNOSIS — I2102 ST elevation (STEMI) myocardial infarction involving left anterior descending coronary artery: Secondary | ICD-10-CM | POA: Diagnosis not present

## 2022-02-25 DIAGNOSIS — I513 Intracardiac thrombosis, not elsewhere classified: Secondary | ICD-10-CM | POA: Diagnosis not present

## 2022-02-25 DIAGNOSIS — Z5181 Encounter for therapeutic drug level monitoring: Secondary | ICD-10-CM | POA: Diagnosis not present

## 2022-02-25 LAB — POCT INR: INR: 1.5 — AB (ref 2.0–3.0)

## 2022-02-25 NOTE — Telephone Encounter (Signed)
**Note De-Identified Caleb Thomas Obfuscation** Letter received from [employer] stating that they have approved the pt for Entresto assistance until 02/28/2023. Pt ID: [id number]  The letter states that they have also notified the pt of this approval as well.

## 2022-02-25 NOTE — Patient Instructions (Signed)
TAKE 1 1/2 TABLETS TODAY AND TOMORROW ONLY AND THEN increase dose to 1 tablet daily except 1/2 tablet on Thursdays.  Recheck INR in 2 weeks. [facility] [phone] Main [phone]

## 2022-03-11 ENCOUNTER — Ambulatory Visit: Payer: No Typology Code available for payment source | Attending: Cardiology

## 2022-03-11 DIAGNOSIS — Z5181 Encounter for therapeutic drug level monitoring: Secondary | ICD-10-CM | POA: Diagnosis not present

## 2022-03-11 DIAGNOSIS — I2102 ST elevation (STEMI) myocardial infarction involving left anterior descending coronary artery: Secondary | ICD-10-CM

## 2022-03-11 LAB — POCT INR: INR: 3 (ref 2.0–3.0)

## 2022-03-11 NOTE — Patient Instructions (Signed)
CONTINUE 1 tablet daily except 1/2 tablet on Thursdays.  Recheck INR in 4 weeks. [facility] Clinic [phone] Main [phone]

## 2022-03-11 NOTE — Progress Notes (Signed)
Remote ICD transmission.   

## 2022-04-08 ENCOUNTER — Ambulatory Visit: Payer: No Typology Code available for payment source | Attending: Cardiovascular Disease

## 2022-04-08 DIAGNOSIS — I513 Intracardiac thrombosis, not elsewhere classified: Secondary | ICD-10-CM

## 2022-04-08 LAB — POCT INR: INR: 2.8 (ref 2.0–3.0)

## 2022-04-08 NOTE — Patient Instructions (Signed)
Description   CONTINUE 1 tablet daily except 1/2 tablet on Thursdays.   Recheck INR in 6 weeks.  [facility] [phone] Main [phone]

## 2022-04-16 ENCOUNTER — Other Ambulatory Visit: Payer: Self-pay | Admitting: Cardiology

## 2022-04-19 ENCOUNTER — Encounter: Payer: Self-pay | Admitting: Cardiology

## 2022-04-19 ENCOUNTER — Ambulatory Visit: Payer: No Typology Code available for payment source | Attending: Cardiology | Admitting: Cardiology

## 2022-04-19 VITALS — BP 114/66 | HR 77 | Ht 68.0 in | Wt 210.4 lb

## 2022-04-19 DIAGNOSIS — I2510 Atherosclerotic heart disease of native coronary artery without angina pectoris: Secondary | ICD-10-CM | POA: Diagnosis not present

## 2022-04-19 DIAGNOSIS — E7800 Pure hypercholesterolemia, unspecified: Secondary | ICD-10-CM | POA: Diagnosis not present

## 2022-04-19 DIAGNOSIS — I255 Ischemic cardiomyopathy: Secondary | ICD-10-CM | POA: Diagnosis not present

## 2022-04-19 DIAGNOSIS — I10 Essential (primary) hypertension: Secondary | ICD-10-CM

## 2022-04-19 DIAGNOSIS — I513 Intracardiac thrombosis, not elsewhere classified: Secondary | ICD-10-CM | POA: Diagnosis not present

## 2022-04-19 DIAGNOSIS — I5022 Chronic systolic (congestive) heart failure: Secondary | ICD-10-CM

## 2022-04-19 DIAGNOSIS — Z79899 Other long term (current) drug therapy: Secondary | ICD-10-CM | POA: Diagnosis not present

## 2022-04-19 NOTE — Patient Instructions (Signed)
Medication Instructions:  Your physician recommends that you continue on your current medications as directed. Please refer to the Current Medication list given to you today.  *If you need a refill on your cardiac medications before your next appointment, please call your pharmacy*   Lab Work: Please complete a comprehensive metabolic panel, a FASTING lipid lab, and a complete blood count in our lab before you leave today.  If you have labs (blood work) drawn today and your tests are completely normal, you will receive your results only by: [place] (if you have MyChart) OR A paper copy in the mail If you have any lab test that is abnormal or we need to change your treatment, we will call you to review the results.   Testing/Procedures: None.   Follow-Up:  Your next appointment:   6 month(s)  Provider:   Fransico Him, MD

## 2022-04-19 NOTE — Addendum Note (Signed)
Addended by: Joni Reining on: 04/19/2022 02:20 PM   Modules accepted: Orders

## 2022-04-19 NOTE — Progress Notes (Signed)
Cardiology Office Note:    Date:  04/19/2022   ID:  Caleb Thomas, DOB Jul 11, 1960, MRN [id number]  PCP:  Carlena Hurl, PA-C  Cardiologist:  Fransico Him, MD    Referring MD: Carlena Hurl, PA-C   Chief Complaint  Patient presents with   Coronary Artery Disease   Hypertension   Hyperlipidemia   Congestive Heart Failure   Cardiomyopathy     History of Present Illness:    Caleb Thomas is a 62 y.o. male with a hx of hyperlipidemia, hypertension, remote history of tobacco and ASCAD with admission 04/2018 with Anterior STEMI. Cath showed severe single-vessel CAD with occluded mid LAD and faint left-to-left collaterals. Given onset of symptoms 3-4 days ago and lack of ongoing chest pain,  recommended medical therapy, including 12 months of dual antiplatelet therapy with aspirin and clopidogrel. EF in the 25% range with what appears to be LV dilatation and anteroapical severe hypokinesia.  Started on Carvedilol and spironolactone and eventually ACE I. Echo with contrast did not show LV thrombus but follow up cardiac MRI confirmed an LV thrombus and he was started on coumadin and LifeVest was placed.    Followup 2D echo on max GDMT as BP tolerated showed persistence of LV dysfunction with EF 25-30% .  He was referred to EP and underwent AICD placement for primary prevention in 01/2019 and is followed in Device clinic by [name].    He is here today for followup and is doing well.  He denies any chest pain or pressure, SOB, DOE, PND, orthopnea, LE edema, dizziness, palpitations or syncope. He has not had any ICD shocks. He is compliant with his meds and is tolerating meds with no SE.      Past Medical History:  Diagnosis Date   CAD (coronary artery disease), native coronary artery 04/2018   s/p anterior STEMI with severe single-vessel CAD with occluded mid LAD and faint left-to-left collaterals.   Chronic systolic (congestive) heart failure (HCC)    Hyperlipidemia    Hypertension  2015   Ischemic cardiomyopathy    EF 25-30% by echo 01/2019 s/p AICD implant   LV (left ventricular) mural thrombus    noted on cardiac MRI but not on echo with contrast>>on chronic warfarin    Past Surgical History:  Procedure Laterality Date   COLONOSCOPY  2013   "normal" per patient, [place]   COLONOSCOPY WITH PROPOFOL N/A 02/26/2021   Procedure: COLONOSCOPY WITH PROPOFOL;  Surgeon: Carol Ada, MD;  Location: [facility];  Service: Endoscopy;  Laterality: N/A;   ESOPHAGOGASTRODUODENOSCOPY (EGD) WITH PROPOFOL N/A 02/26/2021   Procedure: ESOPHAGOGASTRODUODENOSCOPY (EGD) WITH PROPOFOL;  Surgeon: Carol Ada, MD;  Location: [facility];  Service: Endoscopy;  Laterality: N/A;   ICD IMPLANT N/A 02/07/2019   Procedure: ICD IMPLANT;  Surgeon: Evans Lance, MD;  Location: [facility] [facility];  Service: Cardiovascular;  Laterality: N/A;   LEFT HEART CATH AND CORONARY ANGIOGRAPHY N/A 05/06/2018   Procedure: LEFT HEART CATH AND CORONARY ANGIOGRAPHY;  Surgeon: [name], MD;  Location: [facility];  Service: Cardiovascular;  Laterality: N/A;   POLYPECTOMY  02/26/2021   Procedure: POLYPECTOMY;  Surgeon: Carol Ada, MD;  Location: [facility];  Service: Endoscopy;;    Current Medications: Current Meds  Medication Sig   atorvastatin (LIPITOR) 80 MG tablet Take 1 tablet by mouth once daily   clopidogrel (PLAVIX) 75 MG tablet Take 1 tablet (75 mg total) by mouth daily.   dapagliflozin propanediol (FARXIGA) 10 MG TABS  tablet Take 1 tablet (10 mg total) by mouth daily.   meclizine (ANTIVERT) 25 MG tablet Take 1 tablet by mouth twice daily   metoprolol (TOPROL-XL) 200 MG 24 hr tablet TAKE 1 TABLET BY MOUTH ONCE DAILY (STOPPING  CARVEDILOL)   Multiple Vitamins-Minerals (CENTRUM ADULT PO) Take 1 tablet by mouth daily.   omeprazole (PRILOSEC) 40 MG capsule Take 40 mg by mouth daily.   sacubitril-valsartan (ENTRESTO) 97-103 MG Take 1 tablet by mouth 2 (two) times daily.    spironolactone (ALDACTONE) 25 MG tablet Take 1 tablet by mouth once daily   warfarin (COUMADIN) 5 MG tablet TAKE 1/2 TO 1 (ONE-HALF TO ONE) TABLET BY MOUTH ONCE DAILY OR  AS  DIRECTED  BY  ANTICOAGULATION  CLINIC     Allergies:   Patient has no known allergies.   Social History   Socioeconomic History   Marital status: Married    Spouse name: Not on file   Number of children: Not on file   Years of education: Not on file   Highest education level: Not on file  Occupational History   Not on file  Tobacco Use   Smoking status: Former   Smokeless tobacco: Never   Tobacco comments:    quit smoking 62 years old ago  Vaping Use   Vaping Use: Never used  Substance and Sexual Activity   Alcohol use: Yes    Alcohol/week: 3.0 standard drinks of alcohol    Types: 3 Cans of beer per week    Comment: on weekend    Drug use: Not on file   Sexual activity: Not on file  Other Topics Concern   Not on file  Social History Narrative   Married, 10 children, 4 grandchildren.  Former Surveyor, mining at [employer].  Walks for exercise.   Disabled      04/2021   Social Determinants of Health   Financial Resource Strain: Not on file  Food Insecurity: Not on file  Transportation Needs: Not on file  Physical Activity: Not on file  Stress: Not on file  Social Connections: Not on file     Family History: The patient's family history includes Heart disease in his father; Hypertension in his brother and brother; Stroke in his brother and mother. There is no history of Cancer.  ROS:   Please see the history of present illness.    ROS  All other systems reviewed and negative.   EKGs/Labs/Other Studies Reviewed:    The following studies were reviewed today:  2D echo 10/2018 IMPRESSIONS    1. The left ventricle has severely reduced systolic function, with an  ejection fraction of 25-30%. The cavity size was normal. Severe basal  septal hypertrophy. Left ventricular diastolic Doppler  parameters are  consistent with pseudonormalization.   2. There is akinessis of the mid anteroseptal and inferoseptal, apical  and apical lateral walls. There is akinesis of the mid and apical  inferior, anterior and apical inferolateral walls. No evidence of apical  thrombus by definity contrast study.   3. The right ventricle has normal systolic function. The cavity was  normal. There is no increase in right ventricular wall thickness. Right  ventricular systolic pressure is normal with an estimated pressure of 32.3  mmHg.   4. Left atrial size was mildly dilated.   5. The aortic valve is tricuspid. Moderate sclerosis of the aortic valve.  Aortic valve regurgitation is trivial by color flow Doppler.   6. The aorta is normal unless  otherwise noted.   EKG:  EKG is not ordered today   Recent Labs: 06/03/2021: ALT 30 12/20/2021: BUN 21; Creatinine, Ser 1.30; Hemoglobin 18.1; Platelets 232; Potassium 4.0; Sodium 139   Recent Lipid Panel    Component Value Date/Time   CHOL 125 07/06/2020 1206   TRIG 187 (H) 07/06/2020 1206   HDL 28 (L) 07/06/2020 1206   CHOLHDL 4.5 07/06/2020 1206   CHOLHDL 9.5 05/07/2018 0654   VLDL 40 05/07/2018 0654   LDLCALC 65 07/06/2020 1206   LDLCALC 85 03/06/2017 1138    Physical Exam:    VS:  BP 114/66   Pulse 77   Ht 5' 8"$  (1.727 m)   Wt 210 lb 6.4 oz (95.4 kg)   SpO2 97%   BMI 31.99 kg/m     Wt Readings from Last 3 Encounters:  04/19/22 210 lb 6.4 oz (95.4 kg)  12/20/21 205 lb 6.4 oz (93.2 kg)  08/03/21 205 lb (93 kg)     GEN: Well nourished, well developed in no acute distress HEENT: Normal NECK: No JVD; No carotid bruits LYMPHATICS: No lymphadenopathy CARDIAC:RRR, no murmurs, rubs, gallops RESPIRATORY:  Clear to auscultation without rales, wheezing or rhonchi  ABDOMEN: Soft, non-tender, non-distended MUSCULOSKELETAL:  No edema; No deformity  SKIN: Warm and dry NEUROLOGIC:  Alert and oriented x 3 PSYCHIATRIC:  Normal affect   ASSESSMENT:    1. Coronary artery disease involving native coronary artery of native heart without angina pectoris   2. Ischemic cardiomyopathy   3. Chronic systolic (congestive) heart failure ([place])   4. Essential hypertension   5. Pure hypercholesterolemia   6. LV (left ventricular) mural thrombus    PLAN:    In order of problems listed above:  1.  ASCAD -s/p Anterior STEMI 04/2018 with cath showing severe single-vessel CAD with occluded mid LAD and faint left-to-left collaterals. Given onset of symptoms 3-4 days ago and lack of ongoing chest pain,  recommended medical therapy -He has not had any anginal symptoms since I saw him last -Continue prescription drug management with Plavix 75 mg daily, Toprol-XL 200 mg daily and high-dose statin therapy with as needed refills -No ASA due to need for warfarin for history of LV thrombus  2.  Ischemic DCM -EF 25-30% by echo 01/2019 s/p AICD for primary prevention -try to maximize GDMT was much as BP allows -see #3  3.  Chronic systolic CHF -last EF assessment 25-30% -s/p AICD followed in device clinic by EP -He appears euvolemic on today -Continue prescription drug management with Toprol-XL 200 mg daily, Entresto 97-103 mg twice daily, spironolactone 25 mg daily, Farxiga 10 mg daily with as needed refills -check BMET  4.  HTN -BP is controlled on exam today -Continue prescription drug management with Toprol-XL 200 mg daily, Entresto 97-103 mg twice daily spironolactone 25 mg daily with as needed refills  5.  HLD -LDL goal < 70 -Continue prescription drug management with atorvastatin 80 mg daily with as needed refills -Check FLP and ALT  6. LV thrombus -found to have LV thrombus on Cardiac MRI - not noted on echo with contrast and is on warfarin -INR followed in coumadin clinic -He has not had any bleeding problems since -Check CBC today    Medication Adjustments/Labs and Tests Ordered: Current medicines are reviewed at length  with the patient today.  Concerns regarding medicines are outlined above.  No orders of the defined types were placed in this encounter.   No orders of the defined types  were placed in this encounter.  Followup with me in 6 months  Signed, Fransico Him, MD  04/19/2022 2:12 PM    [facility]

## 2022-04-20 LAB — COMPREHENSIVE METABOLIC PANEL
ALT: 37 IU/L (ref 0–44)
AST: 23 IU/L (ref 0–40)
Albumin/Globulin Ratio: 1.2 (ref 1.2–2.2)
Albumin: 4 g/dL (ref 3.9–4.9)
Alkaline Phosphatase: 70 IU/L (ref 44–121)
BUN/Creatinine Ratio: 12 (ref 10–24)
BUN: 15 mg/dL (ref 8–27)
Bilirubin Total: 0.4 mg/dL (ref 0.0–1.2)
CO2: 21 mmol/L (ref 20–29)
Calcium: 9.1 mg/dL (ref 8.6–10.2)
Chloride: 103 mmol/L (ref 96–106)
Creatinine, Ser: 1.25 mg/dL (ref 0.76–1.27)
Globulin, Total: 3.4 g/dL (ref 1.5–4.5)
Glucose: 92 mg/dL (ref 70–99)
Potassium: 4.4 mmol/L (ref 3.5–5.2)
Sodium: 137 mmol/L (ref 134–144)
Total Protein: 7.4 g/dL (ref 6.0–8.5)
eGFR: 65 mL/min/{1.73_m2} (ref >59)

## 2022-04-20 LAB — CBC WITH DIFFERENTIAL/PLATELET
Basophils Absolute: 0.1 10*3/uL (ref 0.0–0.2)
Basos: 1 %
EOS (ABSOLUTE): 0.3 10*3/uL (ref 0.0–0.4)
Eos: 5 %
Hematocrit: 50.3 % (ref 37.5–51.0)
Hemoglobin: 17.9 g/dL — ABNORMAL HIGH (ref 13.0–17.7)
Immature Grans (Abs): 0 10*3/uL (ref 0.0–0.1)
Immature Granulocytes: 0 %
Lymphocytes Absolute: 2.6 10*3/uL (ref 0.7–3.1)
Lymphs: 42 %
MCH: 28.7 pg (ref 26.6–33.0)
MCHC: 35.6 g/dL (ref 31.5–35.7)
MCV: 81 fL (ref 79–97)
Monocytes Absolute: 0.8 10*3/uL (ref 0.1–0.9)
Monocytes: 13 %
Neutrophils Absolute: 2.5 10*3/uL (ref 1.4–7.0)
Neutrophils: 39 %
Platelets: 260 10*3/uL (ref 150–450)
RBC: 6.23 x10E6/uL — ABNORMAL HIGH (ref 4.14–5.80)
RDW: 14.1 % (ref 11.6–15.4)
WBC: 6.3 10*3/uL (ref 3.4–10.8)

## 2022-04-20 LAB — LIPID PANEL
Chol/HDL Ratio: 4 ratio (ref 0.0–5.0)
Cholesterol, Total: 97 mg/dL — ABNORMAL LOW (ref 100–199)
HDL: 24 mg/dL — ABNORMAL LOW (ref >39)
LDL Chol Calc (NIH): 29 mg/dL (ref 0–99)
Triglycerides: 292 mg/dL — ABNORMAL HIGH (ref 0–149)
VLDL Cholesterol Cal: 44 mg/dL — ABNORMAL HIGH (ref 5–40)

## 2022-04-21 ENCOUNTER — Telehealth: Payer: Self-pay

## 2022-04-21 NOTE — Telephone Encounter (Signed)
Pt stated he is taking Lipitor as prescribed and he was not fasting prior to blood drawn.

## 2022-04-21 NOTE — Addendum Note (Signed)
Addended by: Callie Fielding D on: 04/21/2022 09:29 AM   Modules accepted: Orders

## 2022-04-22 ENCOUNTER — Ambulatory Visit: Payer: No Typology Code available for payment source | Attending: Cardiology

## 2022-04-22 ENCOUNTER — Other Ambulatory Visit: Payer: Self-pay | Admitting: Cardiology

## 2022-04-22 DIAGNOSIS — I2510 Atherosclerotic heart disease of native coronary artery without angina pectoris: Secondary | ICD-10-CM

## 2022-04-22 LAB — LIPID PANEL
Chol/HDL Ratio: 3.5 ratio (ref 0.0–5.0)
Cholesterol, Total: 87 mg/dL — ABNORMAL LOW (ref 100–199)
HDL: 25 mg/dL — ABNORMAL LOW (ref >39)
LDL Chol Calc (NIH): 30 mg/dL (ref 0–99)
Triglycerides: 201 mg/dL — ABNORMAL HIGH (ref 0–149)
VLDL Cholesterol Cal: 32 mg/dL (ref 5–40)

## 2022-05-12 ENCOUNTER — Telehealth: Payer: Self-pay

## 2022-05-12 DIAGNOSIS — E7800 Pure hypercholesterolemia, unspecified: Secondary | ICD-10-CM

## 2022-05-12 MED ORDER — ICOSAPENT ETHYL 1 G PO CAPS: 2.0000 g | ORAL_CAPSULE | Freq: Two times a day (BID) | ORAL | 3 refills | Status: DC

## 2022-05-12 NOTE — Telephone Encounter (Signed)
-----   Message from Sueanne Margarita, MD sent at 05/11/2022  9:54 PM EDT ----- Agree with recommendations from PharmD ----- Message ----- From: Joni Reining, RN Sent: 04/27/2022   1:37 PM EDT To: Sueanne Margarita, MD  Pharm D recommendations. ----- Message ----- From: Leeroy Bock, RPH-CPP Sent: 04/26/2022   8:40 AM EST To: Joni Reining, RN  Could add Vascepa for TG lowering and CV benefit since he has a history of CAD. Would dose at 2g BID and add on copay card info to rx: BIN [id number], PCN CN, GRP [id number], ID [id number].

## 2022-05-12 NOTE — Telephone Encounter (Signed)
Called patient to discuss results of recent labs. Cholesterol labs improved but triglycerides still elevated, per Dr. Vonita Moss D could add Vascepa for TG lowering and CV benefit since he has a history of CAD. Would dose at 2g BID and add on copay card info to rx: BIN [id number], PCN CN, GRP [id number], ID [id number]. Patient verbalizes understanding and agrees to plan, orders placed to patient's pharmacy of choice.

## 2022-05-12 NOTE — Telephone Encounter (Signed)
-----   Message from Traci R Turner, MD sent at 05/11/2022  9:54 PM EDT ----- Agree with recommendations from PharmD ----- Message ----- From: Yacob Wilkerson L, RN Sent: 04/27/2022   1:37 PM EDT To: Traci R Turner, MD  Pharm D recommendations. ----- Message ----- From: Supple, Megan E, RPH-CPP Sent: 04/26/2022   8:40 AM EST To: Pratt Bress L Jakala Herford, RN  Could add Vascepa for TG lowering and CV benefit since he has a history of CAD. Would dose at 2g BID and add on copay card info to rx: BIN [id number], PCN CN, GRP [id number], ID [id number].   

## 2022-05-12 NOTE — Telephone Encounter (Signed)
Called number on DPR listed for leaving voice mails, got message saying that number is no longer in service. Called mobile number and left message with no identifying information asking recipient to call the office.

## 2022-05-18 ENCOUNTER — Ambulatory Visit (INDEPENDENT_AMBULATORY_CARE_PROVIDER_SITE_OTHER): Payer: No Typology Code available for payment source

## 2022-05-18 DIAGNOSIS — I255 Ischemic cardiomyopathy: Secondary | ICD-10-CM

## 2022-05-19 LAB — CUP PACEART REMOTE DEVICE CHECK
Date Time Interrogation Session: 20240320070159
Implantable Lead Connection Status: 753985
Implantable Lead Implant Date: 20201210
Implantable Lead Location: 753860
Implantable Lead Model: 436909
Implantable Lead Serial Number: 8102423
Implantable Pulse Generator Implant Date: 20201210
Pulse Gen Model: 429525
Pulse Gen Serial Number: 84745362

## 2022-05-20 ENCOUNTER — Ambulatory Visit: Payer: No Typology Code available for payment source | Attending: Cardiology | Admitting: Pharmacist

## 2022-05-20 ENCOUNTER — Other Ambulatory Visit: Payer: Self-pay | Admitting: Cardiology

## 2022-05-20 DIAGNOSIS — Z5181 Encounter for therapeutic drug level monitoring: Secondary | ICD-10-CM | POA: Diagnosis not present

## 2022-05-20 DIAGNOSIS — I2102 ST elevation (STEMI) myocardial infarction involving left anterior descending coronary artery: Secondary | ICD-10-CM

## 2022-05-20 DIAGNOSIS — I513 Intracardiac thrombosis, not elsewhere classified: Secondary | ICD-10-CM

## 2022-05-20 LAB — POCT INR: INR: 3.2 — AB (ref 2.0–3.0)

## 2022-05-20 NOTE — Patient Instructions (Addendum)
Description   Hold dose today and then CONTINUE 1 tablet daily except 1/2 tablet on Thursdays.   Recheck INR in 3 weeks.  [facility] [phone] Main [phone]

## 2022-05-26 ENCOUNTER — Telehealth: Payer: Self-pay | Admitting: Medical

## 2022-05-26 NOTE — Telephone Encounter (Signed)
Contacted Caleb Thomas to schedule their annual wellness visit. Appointment made for 05/31/22.  Caleb Thomas AWV direct phone # [phone]

## 2022-05-27 ENCOUNTER — Other Ambulatory Visit: Payer: Self-pay | Admitting: Cardiology

## 2022-05-31 ENCOUNTER — Ambulatory Visit: Payer: No Typology Code available for payment source

## 2022-05-31 VITALS — Ht 68.0 in | Wt 200.0 lb

## 2022-05-31 DIAGNOSIS — Z0000 Encounter for general adult medical examination without abnormal findings: Secondary | ICD-10-CM | POA: Diagnosis not present

## 2022-05-31 NOTE — Progress Notes (Signed)
I connected with  Binnie Rail on 05/31/22 by a audio enabled telemedicine application and verified that I am speaking with the correct person using two identifiers.  Patient Location: Home  Provider Location: Office/Clinic  I discussed the limitations of evaluation and management by telemedicine. The patient expressed understanding and agreed to proceed.  Subjective:   Caleb Thomas is a 62 y.o. male who presents for Medicare Annual/Subsequent preventive examination.  Review of Systems     Cardiac Risk Factors include: advanced age (>61men, >59 women);dyslipidemia;hypertension;male gender;obesity (BMI >30kg/m2)     Objective:    Today's Vitals   05/31/22 1012  Weight: 200 lb (90.7 kg)  Height: 5\' 8"  (1.727 m)   Body mass index is 30.41 kg/m.     05/31/2022   10:16 AM 02/26/2021    6:59 AM 02/07/2019    8:19 AM 05/06/2018   11:00 PM  Advanced Directives  Does Patient Have a Medical Advance Directive? Yes No No No  Type of Paramedic of [place];Living will     Copy of [place] in Chart? No - copy requested     Would patient like information on creating a medical advance directive?   No - Patient declined No - Patient declined    Current Medications (verified) Outpatient Encounter Medications as of 05/31/2022  Medication Sig   atorvastatin (LIPITOR) 80 MG tablet Take 1 tablet (80 mg total) by mouth daily.   clopidogrel (PLAVIX) 75 MG tablet Take 1 tablet by mouth once daily   dapagliflozin propanediol (FARXIGA) 10 MG TABS tablet Take 1 tablet (10 mg total) by mouth daily.   icosapent Ethyl (VASCEPA) 1 g capsule Take 2 capsules (2 g total) by mouth 2 (two) times daily.   meclizine (ANTIVERT) 25 MG tablet Take 1 tablet by mouth twice daily   metoprolol (TOPROL-XL) 200 MG 24 hr tablet TAKE 1 TABLET BY MOUTH ONCE DAILY (STOPPING  CARVEDILOL)   Multiple Vitamins-Minerals (CENTRUM ADULT PO) Take 1 tablet by mouth daily.   omeprazole  (PRILOSEC) 40 MG capsule Take 40 mg by mouth daily.   sacubitril-valsartan (ENTRESTO) 97-103 MG Take 1 tablet by mouth 2 (two) times daily.   spironolactone (ALDACTONE) 25 MG tablet Take 1 tablet by mouth once daily   warfarin (COUMADIN) 5 MG tablet TAKE 1/2 TO 1 (ONE-HALF TO ONE) TABLET BY MOUTH ONCE DAILY OR  AS  DIRECTED  BY  ANTICOAGULATION  CLINIC   No facility-administered encounter medications on file as of 05/31/2022.    Allergies (verified) Patient has no known allergies.   History: Past Medical History:  Diagnosis Date   CAD (coronary artery disease), native coronary artery 04/2018   s/p anterior STEMI with severe single-vessel CAD with occluded mid LAD and faint left-to-left collaterals.   Chronic systolic (congestive) heart failure    Hyperlipidemia    Hypertension 2015   Ischemic cardiomyopathy    EF 25-30% by echo 01/2019 s/p AICD implant   LV (left ventricular) mural thrombus    noted on cardiac MRI but not on echo with contrast>>on chronic warfarin   Past Surgical History:  Procedure Laterality Date   COLONOSCOPY  2013   "normal" per patient, [place]   COLONOSCOPY WITH PROPOFOL N/A 02/26/2021   Procedure: COLONOSCOPY WITH PROPOFOL;  Surgeon: Carol Ada, MD;  Location: [facility];  Service: Endoscopy;  Laterality: N/A;   ESOPHAGOGASTRODUODENOSCOPY (EGD) WITH PROPOFOL N/A 02/26/2021   Procedure: ESOPHAGOGASTRODUODENOSCOPY (EGD) WITH PROPOFOL;  Surgeon: Carol Ada, MD;  Location: [facility];  Service: Endoscopy;  Laterality: N/A;   ICD IMPLANT N/A 02/07/2019   Procedure: ICD IMPLANT;  Surgeon: Evans Lance, MD;  Location: [facility];  Service: Cardiovascular;  Laterality: N/A;   LEFT HEART CATH AND CORONARY ANGIOGRAPHY N/A 05/06/2018   Procedure: LEFT HEART CATH AND CORONARY ANGIOGRAPHY;  Surgeon: [name], MD;  Location: [facility];  Service: Cardiovascular;  Laterality: N/A;   POLYPECTOMY  02/26/2021   Procedure: POLYPECTOMY;   Surgeon: Carol Ada, MD;  Location: [facility];  Service: Endoscopy;;   Family History  Problem Relation Age of Onset   Stroke Mother    Heart disease Father    Hypertension Brother    Stroke Brother    Hypertension Brother    Cancer Neg Hx    Social History   Socioeconomic History   Marital status: Married    Spouse name: Not on file   Number of children: Not on file   Years of education: Not on file   Highest education level: Not on file  Occupational History   Not on file  Tobacco Use   Smoking status: Former   Smokeless tobacco: Never   Tobacco comments:    quit smoking 62 years old ago  Vaping Use   Vaping Use: Never used  Substance and Sexual Activity   Alcohol use: Not Currently    Alcohol/week: 3.0 standard drinks of alcohol    Types: 3 Cans of beer per week    Comment: on weekend    Drug use: Not Currently   Sexual activity: Not on file  Other Topics Concern   Not on file  Social History Narrative   Married, 10 children, 4 grandchildren.  Former Surveyor, mining at [employer].  Walks for exercise.   Disabled      04/2021   Social Determinants of Health   Financial Resource Strain: Low Risk  (05/31/2022)   Overall Financial Resource Strain (CARDIA)    Difficulty of Paying Living Expenses: Not hard at all  Food Insecurity: No Food Insecurity (05/31/2022)   Hunger Vital Sign    Worried About Running Out of Food in the Last Year: Never true    Ran Out of Food in the Last Year: Never true  Transportation Needs: No Transportation Needs (05/31/2022)   PRAPARE - Hydrologist (Medical): No    Lack of Transportation (Non-Medical): No  Physical Activity: Sufficiently Active (05/31/2022)   Exercise Vital Sign    Days of Exercise per Week: 7 days    Minutes of Exercise per Session: 30 min  Stress: No Stress Concern Present (05/31/2022)   [place]    Feeling of Stress :  Not at all  Social Connections: Not on file    Tobacco Counseling Counseling given: Not Answered Tobacco comments: quit smoking 62 years old ago   Clinical Intake:  Pre-visit preparation completed: Yes  Pain : No/denies pain     Nutritional Status: BMI > 30  Obese Nutritional Risks: None Diabetes: No  How often do you need to have someone help you when you read instructions, pamphlets, or other written materials from your doctor or pharmacy?: 1 - Never  Diabetic? no  Interpreter Needed?: No  Information entered by :: NAllen LPN   Activities of Daily Living    05/31/2022   10:18 AM  In your present state of health, do you have any difficulty performing the following  activities:  Hearing? 0  Vision? 0  Difficulty concentrating or making decisions? 0  Walking or climbing stairs? 0  Dressing or bathing? 0  Doing errands, shopping? 0  Preparing Food and eating ? N  Using the Toilet? N  In the past six months, have you accidently leaked urine? N  Do you have problems with loss of bowel control? N  Managing your Medications? N  Managing your Finances? N  Housekeeping or managing your Housekeeping? N    Patient Care Team: Tysinger, Camelia Eng, PA-C as PCP - General (Family Medicine) Sueanne Margarita, MD as PCP - Cardiology (Cardiology) Evans Lance, MD as PCP - Electrophysiology (Cardiology)  Indicate any recent Medical Services you may have received from other than [facility] providers in the past year (date may be approximate).     Assessment:   This is a routine wellness examination for Josede.  Hearing/Vision screen Vision Screening - Comments:: Regular eye exams, [facility]  Dietary issues and exercise activities discussed: Current Exercise Habits: Home exercise routine, Type of exercise: walking, Time (Minutes): 30, Frequency (Times/Week): 7, Weekly Exercise (Minutes/Week): 210   Goals Addressed             This Visit's Progress    Patient Stated        05/31/2022, trying to get triglycerides down       Depression Screen    05/31/2022   10:17 AM 05/26/2021    2:58 PM 11/24/2020   11:45 AM 03/09/2018   11:40 AM 12/01/2016   10:59 AM  PHQ 2/9 Scores  PHQ - 2 Score 0 0 0 0 0  PHQ- 9 Score 2        Fall Risk    05/31/2022   10:17 AM 05/26/2021    2:58 PM 11/24/2020   11:45 AM 03/09/2018   11:40 AM 12/01/2016   10:59 AM  Fall Risk   Falls in the past year? 0 0 0 0 No  Number falls in past yr: 0 0 0    Injury with Fall? 0 0 0    Risk for fall due to : Medication side effect No Fall Risks No Fall Risks    Follow up Falls prevention discussed;Education provided;Falls evaluation completed Falls evaluation completed Falls evaluation completed      FALL RISK PREVENTION PERTAINING TO THE HOME:  Any stairs in or around the home? Yes  If so, are there any without handrails? No  Home free of loose throw rugs in walkways, pet beds, electrical cords, etc? Yes  Adequate lighting in your home to reduce risk of falls? Yes   ASSISTIVE DEVICES UTILIZED TO PREVENT FALLS:  Life alert? No  Use of a cane, walker or w/c? No  Grab bars in the bathroom? No  Shower chair or bench in shower? No  Elevated toilet seat or a handicapped toilet? No   TIMED UP AND GO:  Was the test performed? No .      Cognitive Function:        05/31/2022   10:19 AM  6CIT Screen  What Year? 0 points  What month? 0 points  What time? 0 points  Count back from 20 0 points  Months in reverse 0 points  Repeat phrase 0 points  Total Score 0 points    Immunizations Immunization History  Administered Date(s) Administered   Moderna Sars-Covid-2 Vaccination 07/15/2019, 08/12/2019, 03/04/2020   PNEUMOCOCCAL CONJUGATE-20 05/26/2021   Tdap 07/20/2017   Zoster Recombinat (Shingrix) 06/21/2021, 09/01/2021  TDAP status: Up to date  Flu Vaccine status: Declined, Education has been provided regarding the importance of this vaccine but patient still declined. Advised  may receive this vaccine at local pharmacy or Health Dept. Aware to provide a copy of the vaccination record if obtained from local pharmacy or Health Dept. Verbalized acceptance and understanding.  Pneumococcal vaccine status: Declined,  Education has been provided regarding the importance of this vaccine but patient still declined. Advised may receive this vaccine at local pharmacy or Health Dept. Aware to provide a copy of the vaccination record if obtained from local pharmacy or Health Dept. Verbalized acceptance and understanding.   Covid-19 vaccine status: Completed vaccines  Qualifies for Shingles Vaccine? Yes   Zostavax completed No   Shingrix Completed?: Yes  Screening Tests Health Maintenance  Topic Date Due   COVID-19 Vaccine (4 - 2023-24 season) 10/29/2021   INFLUENZA VACCINE  11/24/2024 (Originally 09/29/2022)   Medicare Annual Wellness (AWV)  05/31/2023   DTaP/Tdap/Td (2 - Td or Tdap) 07/21/2027   COLONOSCOPY (Pts 45-74yrs Insurance coverage will need to be confirmed)  02/27/2031   Hepatitis C Screening  Completed   HIV Screening  Completed   Zoster Vaccines- Shingrix  Completed   HPV VACCINES  Aged Out    Health Maintenance  Health Maintenance Due  Topic Date Due   COVID-19 Vaccine (4 - 2023-24 season) 10/29/2021    Colorectal cancer screening: Type of screening: Colonoscopy. Completed 02/26/2021. Repeat every 5-7 years  Lung Cancer Screening: (Low Dose CT Chest recommended if Age 33-80 years, 30 pack-year currently smoking OR have quit w/in 15years.) does not qualify.   Lung Cancer Screening Referral: no  Additional Screening:  Hepatitis C Screening: does qualify; Completed 07/20/2017  Vision Screening: Recommended annual ophthalmology exams for early detection of glaucoma and other disorders of the eye. Is the patient up to date with their annual eye exam?  Yes  Who is the provider or what is the name of the office in which the patient attends annual eye  exams? [facility] If pt is not established with a provider, would they like to be referred to a provider to establish care? No .   Dental Screening: Recommended annual dental exams for proper oral hygiene  Community Resource Referral / Chronic Care Management: CRR required this visit?  No   CCM required this visit?  No      Plan:     I have personally reviewed and noted the following in the patient's chart:   Medical and social history Use of alcohol, tobacco or illicit drugs  Current medications and supplements including opioid prescriptions. Patient is not currently taking opioid prescriptions. Functional ability and status Nutritional status Physical activity Advanced directives List of other physicians Hospitalizations, surgeries, and ER visits in previous 12 months Vitals Screenings to include cognitive, depression, and falls Referrals and appointments  In addition, I have reviewed and discussed with patient certain preventive protocols, quality metrics, and best practice recommendations. A written personalized care plan for preventive services as well as general preventive health recommendations were provided to patient.     Kellie Simmering, LPN   [id number]   Nurse Notes: none  Due to this being a virtual visit, the after visit summary with patients personalized plan was offered to patient via mail or my-chart.  Patient would like to access on my-chart

## 2022-05-31 NOTE — Patient Instructions (Signed)
Caleb Thomas , Thank you for taking time to come for your Medicare Wellness Visit. I appreciate your ongoing commitment to your health goals. Please review the following plan we discussed and let me know if I can assist you in the future.   These are the goals we discussed:  Goals      Patient Stated     05/31/2022, trying to get triglycerides down        This is a list of the screening recommended for you and due dates:  Health Maintenance  Topic Date Due   COVID-19 Vaccine (4 - 2023-24 season) 10/29/2021   Flu Shot  11/24/2024*   Medicare Annual Wellness Visit  05/31/2023   DTaP/Tdap/Td vaccine (2 - Td or Tdap) 07/21/2027   Colon Cancer Screening  02/27/2031   Hepatitis C Screening: USPSTF Recommendation to screen - Ages 18-79 yo.  Completed   HIV Screening  Completed   Zoster (Shingles) Vaccine  Completed   HPV Vaccine  Aged Out  *Topic was postponed. The date shown is not the original due date.    Advanced directives: Please bring a copy of your POA (Power of Attorney) and/or Living Will to your next appointment.   Conditions/risks identified: none  Next appointment: Follow up in one year for your annual wellness visit   Preventive Care 40-64 Years, Male Preventive care refers to lifestyle choices and visits with your health care provider that can promote health and wellness. What does preventive care include? A yearly physical exam. This is also called an annual well check. Dental exams once or twice a year. Routine eye exams. Ask your health care provider how often you should have your eyes checked. Personal lifestyle choices, including: Daily care of your teeth and gums. Regular physical activity. Eating a healthy diet. Avoiding tobacco and drug use. Limiting alcohol use. Practicing safe sex. Taking low-dose aspirin every day starting at age 21. What happens during an annual well check? The services and screenings done by your health care provider during your annual  well check will depend on your age, overall health, lifestyle risk factors, and family history of disease. Counseling  Your health care provider may ask you questions about your: Alcohol use. Tobacco use. Drug use. Emotional well-being. Home and relationship well-being. Sexual activity. Eating habits. Work and work Statistician. Screening  You may have the following tests or measurements: Height, weight, and BMI. Blood pressure. Lipid and cholesterol levels. These may be checked every 5 years, or more frequently if you are over 42 years old. Skin check. Lung cancer screening. You may have this screening every year starting at age 47 if you have a 30-pack-year history of smoking and currently smoke or have quit within the past 15 years. Fecal occult blood test (FOBT) of the stool. You may have this test every year starting at age 15. Flexible sigmoidoscopy or colonoscopy. You may have a sigmoidoscopy every 5 years or a colonoscopy every 10 years starting at age 80. Prostate cancer screening. Recommendations will vary depending on your family history and other risks. Hepatitis C blood test. Hepatitis B blood test. Sexually transmitted disease (STD) testing. Diabetes screening. This is done by checking your blood sugar (glucose) after you have not eaten for a while (fasting). You may have this done every 1-3 years. Discuss your test results, treatment options, and if necessary, the need for more tests with your health care provider. Vaccines  Your health care provider may recommend certain vaccines, such as: Influenza vaccine.  This is recommended every year. Tetanus, diphtheria, and acellular pertussis (Tdap, Td) vaccine. You may need a Td booster every 10 years. Zoster vaccine. You may need this after age 57. Pneumococcal 13-valent conjugate (PCV13) vaccine. You may need this if you have certain conditions and have not been vaccinated. Pneumococcal polysaccharide (PPSV23) vaccine. You  may need one or two doses if you smoke cigarettes or if you have certain conditions. Talk to your health care provider about which screenings and vaccines you need and how often you need them. This information is not intended to replace advice given to you by your health care provider. Make sure you discuss any questions you have with your health care provider. Document Released: 03/13/2015 Document Revised: 11/04/2015 Document Reviewed: 12/16/2014 Elsevier Interactive Patient Education  2017 [employer] Prevention in the Home Falls can cause injuries. They can happen to people of all ages. There are many things you can do to make your home safe and to help prevent falls. What can I do on the outside of my home? Regularly fix the edges of walkways and driveways and fix any cracks. Remove anything that might make you trip as you walk through a door, such as a raised step or threshold. Trim any bushes or trees on the path to your home. Use bright outdoor lighting. Clear any walking paths of anything that might make someone trip, such as rocks or tools. Regularly check to see if handrails are loose or broken. Make sure that both sides of any steps have handrails. Any raised decks and porches should have guardrails on the edges. Have any leaves, snow, or ice cleared regularly. Use sand or salt on walking paths during winter. Clean up any spills in your garage right away. This includes oil or grease spills. What can I do in the bathroom? Use night lights. Install grab bars by the toilet and in the tub and shower. Do not use towel bars as grab bars. Use non-skid mats or decals in the tub or shower. If you need to sit down in the shower, use a plastic, non-slip stool. Keep the floor dry. Clean up any water that spills on the floor as soon as it happens. Remove soap buildup in the tub or shower regularly. Attach bath mats securely with double-sided non-slip rug tape. Do not have throw  rugs and other things on the floor that can make you trip. What can I do in the bedroom? Use night lights. Make sure that you have a light by your bed that is easy to reach. Do not use any sheets or blankets that are too big for your bed. They should not hang down onto the floor. Have a firm chair that has side arms. You can use this for support while you get dressed. Do not have throw rugs and other things on the floor that can make you trip. What can I do in the kitchen? Clean up any spills right away. Avoid walking on wet floors. Keep items that you use a lot in easy-to-reach places. If you need to reach something above you, use a strong step stool that has a grab bar. Keep electrical cords out of the way. Do not use floor polish or wax that makes floors slippery. If you must use wax, use non-skid floor wax. Do not have throw rugs and other things on the floor that can make you trip. What can I do with my stairs? Do not leave any items on the stairs.  Make sure that there are handrails on both sides of the stairs and use them. Fix handrails that are broken or loose. Make sure that handrails are as long as the stairways. Check any carpeting to make sure that it is firmly attached to the stairs. Fix any carpet that is loose or worn. Avoid having throw rugs at the top or bottom of the stairs. If you do have throw rugs, attach them to the floor with carpet tape. Make sure that you have a light switch at the top of the stairs and the bottom of the stairs. If you do not have them, ask someone to add them for you. What else can I do to help prevent falls? Wear shoes that: Do not have high heels. Have rubber bottoms. Are comfortable and fit you well. Are closed at the toe. Do not wear sandals. If you use a stepladder: Make sure that it is fully opened. Do not climb a closed stepladder. Make sure that both sides of the stepladder are locked into place. Ask someone to hold it for you, if  possible. Clearly mark and make sure that you can see: Any grab bars or handrails. First and last steps. Where the edge of each step is. Use tools that help you move around (mobility aids) if they are needed. These include: Canes. Walkers. Scooters. Crutches. Turn on the lights when you go into a dark area. Replace any light bulbs as soon as they burn out. Set up your furniture so you have a clear path. Avoid moving your furniture around. If any of your floors are uneven, fix them. If there are any pets around you, be aware of where they are. Review your medicines with your doctor. Some medicines can make you feel dizzy. This can increase your chance of falling. Ask your doctor what other things that you can do to help prevent falls. This information is not intended to replace advice given to you by your health care provider. Make sure you discuss any questions you have with your health care provider. Document Released: 12/11/2008 Document Revised: 07/23/2015 Document Reviewed: 03/21/2014 Elsevier Interactive Patient Education  2017 [employer].

## 2022-06-10 ENCOUNTER — Ambulatory Visit: Payer: No Typology Code available for payment source | Attending: Cardiology | Admitting: *Deleted

## 2022-06-10 DIAGNOSIS — Z5181 Encounter for therapeutic drug level monitoring: Secondary | ICD-10-CM

## 2022-06-10 DIAGNOSIS — I2102 ST elevation (STEMI) myocardial infarction involving left anterior descending coronary artery: Secondary | ICD-10-CM

## 2022-06-10 DIAGNOSIS — I513 Intracardiac thrombosis, not elsewhere classified: Secondary | ICD-10-CM

## 2022-06-10 LAB — POCT INR: INR: 3.4 — AB (ref 2.0–3.0)

## 2022-06-10 NOTE — Patient Instructions (Signed)
Description   Hold dose today and then START 1 tablet daily except 1/2 tablet on Sundays and Thursdays. Recheck INR in 3 weeks.  [facility] [phone] Main [phone]

## 2022-06-21 ENCOUNTER — Encounter: Payer: Self-pay | Admitting: Medical

## 2022-06-21 ENCOUNTER — Ambulatory Visit (INDEPENDENT_AMBULATORY_CARE_PROVIDER_SITE_OTHER): Payer: No Typology Code available for payment source | Admitting: Medical

## 2022-06-21 VITALS — BP 110/64 | HR 65 | Ht 68.0 in | Wt 210.8 lb

## 2022-06-21 DIAGNOSIS — Z0000 Encounter for general adult medical examination without abnormal findings: Secondary | ICD-10-CM | POA: Insufficient documentation

## 2022-06-21 DIAGNOSIS — I10 Essential (primary) hypertension: Secondary | ICD-10-CM | POA: Diagnosis not present

## 2022-06-21 DIAGNOSIS — R5383 Other fatigue: Secondary | ICD-10-CM | POA: Diagnosis not present

## 2022-06-21 DIAGNOSIS — Z95810 Presence of automatic (implantable) cardiac defibrillator: Secondary | ICD-10-CM | POA: Diagnosis not present

## 2022-06-21 DIAGNOSIS — I5022 Chronic systolic (congestive) heart failure: Secondary | ICD-10-CM

## 2022-06-21 DIAGNOSIS — I513 Intracardiac thrombosis, not elsewhere classified: Secondary | ICD-10-CM

## 2022-06-21 DIAGNOSIS — I2510 Atherosclerotic heart disease of native coronary artery without angina pectoris: Secondary | ICD-10-CM

## 2022-06-21 DIAGNOSIS — Z125 Encounter for screening for malignant neoplasm of prostate: Secondary | ICD-10-CM

## 2022-06-21 DIAGNOSIS — K219 Gastro-esophageal reflux disease without esophagitis: Secondary | ICD-10-CM | POA: Diagnosis not present

## 2022-06-21 DIAGNOSIS — E7800 Pure hypercholesterolemia, unspecified: Secondary | ICD-10-CM | POA: Diagnosis not present

## 2022-06-21 DIAGNOSIS — Z131 Encounter for screening for diabetes mellitus: Secondary | ICD-10-CM | POA: Diagnosis not present

## 2022-06-21 DIAGNOSIS — R7301 Impaired fasting glucose: Secondary | ICD-10-CM

## 2022-06-21 LAB — POCT URINALYSIS DIP (PROADVANTAGE DEVICE)
Bilirubin, UA: NEGATIVE
Blood, UA: NEGATIVE
Glucose, UA: >=1000 mg/dL — AB
Ketones, POC UA: NEGATIVE mg/dL
Leukocytes, UA: NEGATIVE
Nitrite, UA: NEGATIVE
Specific Gravity, Urine: 1.015
Urobilinogen, Ur: NEGATIVE
pH, UA: 6 (ref 5.0–8.0)

## 2022-06-21 MED ORDER — PANTOPRAZOLE SODIUM 40 MG PO TBEC: DELAYED_RELEASE_TABLET | ORAL | 3 refills | Status: DC

## 2022-06-21 NOTE — Progress Notes (Signed)
Subjective:    Caleb Thomas is a 62 y.o. male who presents for Preventative Services visit and chronic medical problems/med check visit.    Chief Complaint  Patient presents with   fasting cpe    Fasting cpe, already had AWV done    Primary Care Provider Gregorey Nabor, Kermit Balo, PA-C here for primary care  Current Health Care Team: Dentist, Dr. dentures Eye doctor, [facility] Cardio- Dr. Armanda Magic GI- Dr. Jeani Hawking   Concerns: Overall doing ok.    Some mornings awakes with diarrhea  Exercise - walking regularly, Some weight bearing exercise  Still gets right knee pain from time to time.  Had steroid shot here last year with Dr. Susann Givens.  Lately some pain, sometimes feels like knee will give out.  No swelling.  No numbness, tingling or weakness.  No fall or injury.     Past Medical History:  Diagnosis Date   CAD (coronary artery disease), native coronary artery 04/2018   s/p anterior STEMI with severe single-vessel CAD with occluded mid LAD and faint left-to-left collaterals.   Chronic systolic (congestive) heart failure    Hyperlipidemia    Hypertension 2015   Ischemic cardiomyopathy    EF 25-30% by echo 01/2019 s/p AICD implant   LV (left ventricular) mural thrombus    noted on cardiac MRI but not on echo with contrast>>on chronic warfarin    Past Surgical History:  Procedure Laterality Date   COLONOSCOPY  2013   "normal" per patient, [place]   COLONOSCOPY WITH PROPOFOL N/A 02/26/2021   Procedure: COLONOSCOPY WITH PROPOFOL;  Surgeon: Jeani Hawking, MD;  Location: [facility];  Service: Endoscopy;  Laterality: N/A;   ESOPHAGOGASTRODUODENOSCOPY (EGD) WITH PROPOFOL N/A 02/26/2021   Procedure: ESOPHAGOGASTRODUODENOSCOPY (EGD) WITH PROPOFOL;  Surgeon: Jeani Hawking, MD;  Location: [facility];  Service: Endoscopy;  Laterality: N/A;   ICD IMPLANT N/A 02/07/2019   Procedure: ICD IMPLANT;  Surgeon: Marinus Maw, MD;  Location: [facility] INVASIVE CV LAB;   Service: Cardiovascular;  Laterality: N/A;   LEFT HEART CATH AND CORONARY ANGIOGRAPHY N/A 05/06/2018   Procedure: LEFT HEART CATH AND CORONARY ANGIOGRAPHY;  Surgeon: Yvonne Kendall, MD;  Location: [facility] INVASIVE CV LAB;  Service: Cardiovascular;  Laterality: N/A;   POLYPECTOMY  02/26/2021   Procedure: POLYPECTOMY;  Surgeon: Jeani Hawking, MD;  Location: [facility];  Service: Endoscopy;;    Social History   Socioeconomic History   Marital status: Married    Spouse name: Not on file   Number of children: Not on file   Years of education: Not on file   Highest education level: Not on file  Occupational History   Not on file  Tobacco Use   Smoking status: Former   Smokeless tobacco: Never   Tobacco comments:    quit smoking 62 years old ago  Vaping Use   Vaping Use: Never used  Substance and Sexual Activity   Alcohol use: Not Currently    Alcohol/week: 3.0 standard drinks of alcohol    Types: 3 Cans of beer per week    Comment: on weekend    Drug use: Not Currently   Sexual activity: Not on file  Other Topics Concern   Not on file  Social History Narrative   Married, 10 children, 4 grandchildren.  Former Engineer, civil (consulting) at [employer].  Walks for exercise.   Disabled   05/2022   Social Determinants of Health   Financial Resource Strain: Low Risk  (05/31/2022)   Overall Financial Resource  Strain (CARDIA)    Difficulty of Paying Living Expenses: Not hard at all  Food Insecurity: No Food Insecurity (05/31/2022)   Hunger Vital Sign    Worried About Running Out of Food in the Last Year: Never true    Ran Out of Food in the Last Year: Never true  Transportation Needs: No Transportation Needs (05/31/2022)   PRAPARE - Administrator, Civil Service (Medical): No    Lack of Transportation (Non-Medical): No  Physical Activity: Sufficiently Active (05/31/2022)   Exercise Vital Sign    Days of Exercise per Week: 7 days    Minutes of Exercise per Session: 30 min  Stress: No  Stress Concern Present (05/31/2022)   [employer] of Occupational Health - Occupational Stress Questionnaire    Feeling of Stress : Not at all  Social Connections: Not on file  Intimate Partner Violence: Not on file    Family History  Problem Relation Age of Onset   Stroke Mother    Heart disease Father    Hypertension Brother    Stroke Brother    Hypertension Brother    Cancer Neg Hx      Current Outpatient Medications:    atorvastatin (LIPITOR) 80 MG tablet, Take 1 tablet (80 mg total) by mouth daily., Disp: 90 tablet, Rfl: 3   clopidogrel (PLAVIX) 75 MG tablet, Take 1 tablet by mouth once daily, Disp: 90 tablet, Rfl: 3   dapagliflozin propanediol (FARXIGA) 10 MG TABS tablet, Take 1 tablet (10 mg total) by mouth daily., Disp: 90 tablet, Rfl: 3   icosapent Ethyl (VASCEPA) 1 g capsule, Take 2 capsules (2 g total) by mouth 2 (two) times daily., Disp: 180 capsule, Rfl: 3   metoprolol (TOPROL-XL) 200 MG 24 hr tablet, TAKE 1 TABLET BY MOUTH ONCE DAILY (STOPPING  CARVEDILOL), Disp: 90 tablet, Rfl: 0   Multiple Vitamins-Minerals (CENTRUM ADULT PO), Take 1 tablet by mouth daily., Disp: , Rfl:    pantoprazole (PROTONIX) 40 MG tablet, 1 tablet daily po 45 min prior to breakfast, Disp: 90 tablet, Rfl: 3   sacubitril-valsartan (ENTRESTO) 97-103 MG, Take 1 tablet by mouth 2 (two) times daily., Disp: 180 tablet, Rfl: 3   spironolactone (ALDACTONE) 25 MG tablet, Take 1 tablet by mouth once daily, Disp: 90 tablet, Rfl: 3   warfarin (COUMADIN) 5 MG tablet, TAKE 1/2 TO 1 (ONE-HALF TO ONE) TABLET BY MOUTH ONCE DAILY OR  AS  DIRECTED  BY  ANTICOAGULATION  CLINIC, Disp: 35 tablet, Rfl: 3  No Known Allergies  History reviewed: allergies, current medications, past family history, past medical history, past social history, past surgical history and problem list  Chronic issues discussed: Compliant with medicaiton  Acute issues discussed: Left knee pain x a few months.  No injury or  trauma  Objective:     Biometrics BP 110/64   Pulse 65   Ht 5\' 8"  (1.727 m)   Wt 210 lb 12.8 oz (95.6 kg)   BMI 32.05 kg/m   Wt Readings from Last 3 Encounters:  06/21/22 210 lb 12.8 oz (95.6 kg)  05/31/22 200 lb (90.7 kg)  04/19/22 210 lb 6.4 oz (95.4 kg)    Gen: wd, wn, nad, African American male HEENT: normocephalic, sclerae anicteric, TMs pearly, nares patent, no discharge or erythema, pharynx normal Oral cavity: MMM, no lesions Neck: supple, no lymphadenopathy, no thyromegaly, no masses, no bruits Heart: RRR, normal S1, S2, no murmurs Lungs: CTA bilaterally, no wheezes, rhonchi, or rales Abdomen: +bs, soft,  non tender, non distended, no masses, no hepatomegaly, no splenomegaly Musculoskeletal:  UE and LE nontender, no swelling, no obvious deformity Extremities: no edema, no cyanosis, no clubbing Pulses: 2+ symmetric, upper and lower extremities, normal cap refill Neurological: alert, oriented x 3, CN2-12 intact, strength normal upper extremities and lower extremities, sensation normal throughout, DTRs 2+ throughout, no cerebellar signs, gait normal Psychiatric: normal affect, behavior normal, pleasant  GU: deferred Rectal deferred/declined    Assessment:   Encounter Diagnoses  Name Primary?   Encounter for health maintenance examination in adult Yes   Other fatigue    Essential hypertension    Coronary artery disease involving native coronary artery of native heart without angina pectoris    Screening for prostate cancer    ICD (implantable cardioverter-defibrillator) in place    Pure hypercholesterolemia    Impaired fasting glucose    LV (left ventricular) mural thrombus    Chronic systolic (congestive) heart failure    Screening for diabetes mellitus    Gastroesophageal reflux disease, unspecified whether esophagitis present       Plan:    This visit was a preventative care visit, also known as wellness visit or routine physical.   Topics typically  include healthy lifestyle, diet, exercise, preventative care, vaccinations, sick and well care, proper use of emergency dept and after hours care, as well as other concerns.     Recommendations: Continue to return yearly for your annual wellness and preventative care visits.  This gives [place] a chance to discuss healthy lifestyle, exercise, vaccinations, review your chart record, and perform screenings where appropriate.  I recommend you see your eye doctor yearly for routine vision care.  I recommend you see your dentist yearly for routine dental care including hygiene visits twice yearly.   Vaccination recommendations were reviewed Immunization History  Administered Date(s) Administered   [employer] Vaccination 07/15/2019, 08/12/2019, 03/04/2020   PNEUMOCOCCAL CONJUGATE-20 05/26/2021   Tdap 07/20/2017   Zoster Recombinat (Shingrix) 06/21/2021, 09/01/2021     Screening for cancer: Colon cancer screening: I reviewed your colonoscopy on file that is up to date from 01/2021  We discussed PSA, prostate exam, and prostate cancer screening risks/benefits.     Skin cancer screening: Check your skin regularly for new changes, growing lesions, or other lesions of concern Come in for evaluation if you have skin lesions of concern.  Lung cancer screening: If you have a greater than 20 pack year history of tobacco use, then you may qualify for lung cancer screening with a chest CT scan.   Please call your insurance company to inquire about coverage for this test.  We currently don't have screenings for other cancers besides breast, cervical, colon, and lung cancers.  If you have a strong family history of cancer or have other cancer screening concerns, please let me know.    Bone health: Get at least 150 minutes of aerobic exercise weekly Get weight bearing exercise at least once weekly Bone density test:  A bone density test is an imaging test that uses a type of X-ray to  measure the amount of calcium and other minerals in your bones. The test may be used to diagnose or screen you for a condition that causes weak or thin bones (osteoporosis), predict your risk for a broken bone (fracture), or determine how well your osteoporosis treatment is working. The bone density test is recommended for females 65 and older, or females or males <65 if certain risk factors such as thyroid disease, long  term use of steroids such as for asthma or rheumatological issues, vitamin D deficiency, estrogen deficiency, family history of osteoporosis, self or family history of fragility fracture in first degree relative.    Heart health: Get at least 150 minutes of aerobic exercise weekly Limit alcohol It is important to maintain a healthy blood pressure and healthy cholesterol numbers  Follow up with cardiology as planned   Medical care options: I recommend you continue to seek care here first for routine care.  We try really hard to have available appointments Monday through Friday daytime hours for sick visits, acute visits, and physicals.  Urgent care should be used for after hours and weekends for significant issues that cannot wait till the next day.  The emergency department should be used for significant potentially life-threatening emergencies.  The emergency department is expensive, can often have long wait times for less significant concerns, so try to utilize primary care, urgent care, or telemedicine when possible to avoid unnecessary trips to the emergency department.  Virtual visits and telemedicine have been introduced since the pandemic started in 2020, and can be convenient ways to receive medical care.  We offer virtual appointments as well to assist you in a variety of options to seek medical care.   Advanced Directives: I recommend you consider completing a Health Care Power of Attorney and Living Will.   These documents respect your wishes and help alleviate burdens  on your loved ones if you were to become terminally ill or be in a position to need those documents enforced.    You can complete Advanced Directives yourself, have them notarized, then have copies made for our office, for you and for anybody you feel should have them in safe keeping.  Or, you can have an attorney prepare these documents.   If you haven't updated your Last Will and Testament in a while, it may be worthwhile having an attorney prepare these documents together and save on some costs.       Separate significant issues discussed: Heart disease, ICD in place, history of heart failure-managed by cardiology.  I reviewed his recent notes from February 2024 visit  Dyslipidemia-recently restarted by cardiology on prescription fish oil along with his statin ongoing given high triglycerides  Impaired glucose-updated labs today  Right knee pain - I reviewed his prior x-ray showing arthritis.  He got some relief with a steroid shot about a year ago.  We discussed possible repeat steroid shot versus referral to Ortho or physical therapy.  We also discussed over-the-counter remedies he can use  History of left ventricle mural thrombus-on Coumadin  GERD  - change from omeprazole to protonix for safety given potential interactions with PPI and plavix   Shadee was seen today for fasting cpe.  Diagnoses and all orders for this visit:  Encounter for health maintenance examination in adult -     PSA -     POCT Urinalysis DIP (Proadvantage Device) -     Hemoglobin A1c -     TSH -     Testosterone  Other fatigue -     TSH -     Testosterone  Essential hypertension  Coronary artery disease involving native coronary artery of native heart without angina pectoris  Screening for prostate cancer -     PSA  ICD (implantable cardioverter-defibrillator) in place  Pure hypercholesterolemia  Impaired fasting glucose -     POCT Urinalysis DIP (Proadvantage Device) -     Hemoglobin  A1c -  TSH  LV (left ventricular) mural thrombus  Chronic systolic (congestive) heart failure  Screening for diabetes mellitus -     Hemoglobin A1c  Gastroesophageal reflux disease, unspecified whether esophagitis present  Other orders -     pantoprazole (PROTONIX) 40 MG tablet; 1 tablet daily po 45 min prior to breakfast     F/u pending labs

## 2022-06-22 LAB — PSA: Prostate Specific Ag, Serum: 1 ng/mL (ref 0.0–4.0)

## 2022-06-22 LAB — TSH: TSH: 2.21 u[IU]/mL (ref 0.450–4.500)

## 2022-06-22 LAB — TESTOSTERONE: Testosterone: 448 ng/dL (ref 264–916)

## 2022-06-22 LAB — HEMOGLOBIN A1C
Est. average glucose Bld gHb Est-mCnc: 134 mg/dL
Hgb A1c MFr Bld: 6.3 % — ABNORMAL HIGH (ref 4.8–5.6)

## 2022-06-22 NOTE — Progress Notes (Signed)
Results sent through My Chart, but call about the following  He mention fatigue yesterday.  Has he ever done a sleep study to evaluate for sleep apnea?  If not then sleep study might be something to pursue since all other labs showed no obvious reason for her fatigue

## 2022-06-23 NOTE — Progress Notes (Signed)
Remote ICD transmission.   

## 2022-07-01 ENCOUNTER — Ambulatory Visit: Payer: Medicare PPO | Attending: Cardiovascular Disease

## 2022-07-01 DIAGNOSIS — Z5181 Encounter for therapeutic drug level monitoring: Secondary | ICD-10-CM

## 2022-07-01 DIAGNOSIS — I513 Intracardiac thrombosis, not elsewhere classified: Secondary | ICD-10-CM

## 2022-07-01 DIAGNOSIS — I2102 ST elevation (STEMI) myocardial infarction involving left anterior descending coronary artery: Secondary | ICD-10-CM | POA: Diagnosis not present

## 2022-07-01 LAB — POCT INR: INR: 2.5 (ref 2.0–3.0)

## 2022-07-01 NOTE — Patient Instructions (Signed)
Description   Continue 1 tablet daily except 1/2 tablet on Sundays and Thursdays.  Recheck INR in 4 weeks.  [facility] [phone] Main [phone]

## 2022-07-22 ENCOUNTER — Other Ambulatory Visit: Payer: Self-pay | Admitting: Cardiology

## 2022-07-29 ENCOUNTER — Ambulatory Visit: Payer: Medicare PPO | Attending: Cardiology

## 2022-07-29 DIAGNOSIS — I513 Intracardiac thrombosis, not elsewhere classified: Secondary | ICD-10-CM

## 2022-07-29 DIAGNOSIS — Z5181 Encounter for therapeutic drug level monitoring: Secondary | ICD-10-CM | POA: Diagnosis not present

## 2022-07-29 DIAGNOSIS — I2102 ST elevation (STEMI) myocardial infarction involving left anterior descending coronary artery: Secondary | ICD-10-CM

## 2022-07-29 LAB — POCT INR: INR: 2.2 (ref 2.0–3.0)

## 2022-07-29 NOTE — Patient Instructions (Signed)
Description   Continue 1 tablet daily except 1/2 tablet on Sundays and Thursdays.  Recheck INR in 5 weeks.  [facility] [phone] Main [phone]

## 2022-08-06 ENCOUNTER — Other Ambulatory Visit: Payer: Self-pay | Admitting: Cardiology

## 2022-08-06 DIAGNOSIS — I513 Intracardiac thrombosis, not elsewhere classified: Secondary | ICD-10-CM

## 2022-08-08 ENCOUNTER — Telehealth: Payer: Self-pay

## 2022-08-08 NOTE — Telephone Encounter (Signed)
Biotronik alert received:  Atrial monitoring episode detected 2 atrial monitoring episode(s) detected between Aug 07, 2022, 1:42:02 AM and Aug 08, 2022, 1:42:02 AM    2 Brief noted episodes (longest 17 seconds). Both show new onset AF/Flutter.  Short, patient is on coumadin already for STEMI.  Will continue to monitor.

## 2022-08-08 NOTE — Telephone Encounter (Signed)
Warfarin 5mg  refill LV (left ventricular) mural thrombus-chronic warfarin  Last INR 07/29/22 Last OV 04/19/22

## 2022-08-09 ENCOUNTER — Telehealth: Payer: Self-pay

## 2022-08-09 ENCOUNTER — Telehealth: Payer: Self-pay | Admitting: Internal Medicine

## 2022-08-09 NOTE — Telephone Encounter (Signed)
Pt called and states that his protonix is not helping at night and pt is still coughing and he is having to sit with a wedge under his pillow to help him.  Please advise.

## 2022-08-09 NOTE — Telephone Encounter (Signed)
Pt has an appt.

## 2022-08-09 NOTE — Telephone Encounter (Signed)
Alert for AF RVR + warfarin. LMTCB.

## 2022-08-09 NOTE — Telephone Encounter (Signed)
Patient denies any palpitations, shortness of breath, fatigue or acute symptoms. States he has felt well and compliant with OAC. Patient advised to call if any symptoms arise and will recheck biotronik monitor tomorrow 08/10/22. Clock reset for tomorrow.

## 2022-08-11 ENCOUNTER — Ambulatory Visit (INDEPENDENT_AMBULATORY_CARE_PROVIDER_SITE_OTHER): Payer: Medicare PPO | Admitting: Medical

## 2022-08-11 ENCOUNTER — Other Ambulatory Visit: Payer: Self-pay | Admitting: Medical

## 2022-08-11 VITALS — BP 110/64 | HR 80 | Temp 97.1°F | Wt 207.6 lb

## 2022-08-11 DIAGNOSIS — R350 Frequency of micturition: Secondary | ICD-10-CM

## 2022-08-11 DIAGNOSIS — R0982 Postnasal drip: Secondary | ICD-10-CM | POA: Diagnosis not present

## 2022-08-11 DIAGNOSIS — Z79899 Other long term (current) drug therapy: Secondary | ICD-10-CM | POA: Diagnosis not present

## 2022-08-11 DIAGNOSIS — R052 Subacute cough: Secondary | ICD-10-CM | POA: Diagnosis not present

## 2022-08-11 LAB — POCT URINALYSIS DIP (PROADVANTAGE DEVICE)
Bilirubin, UA: NEGATIVE
Blood, UA: NEGATIVE
Glucose, UA: >=1000 mg/dL — AB
Ketones, POC UA: NEGATIVE mg/dL
Leukocytes, UA: NEGATIVE
Nitrite, UA: NEGATIVE
Specific Gravity, Urine: 1.02
Urobilinogen, Ur: NEGATIVE
pH, UA: 6 (ref 5.0–8.0)

## 2022-08-11 MED ORDER — BENZONATATE 200 MG PO CAPS: 200.0000 mg | ORAL_CAPSULE | Freq: Three times a day (TID) | ORAL | 0 refills | Status: DC | PRN

## 2022-08-11 MED ORDER — FAMOTIDINE 20 MG PO TABS: ORAL_TABLET | ORAL | 1 refills | Status: DC

## 2022-08-11 NOTE — Patient Instructions (Addendum)
Cough can be caused by numerous issues including allergies, postnasal drainage, acid reflux, asthma, pneumonia or other.  In your case the cough could be a combination of allergies and acid reflux   Recommendations Consider beginning Flonase nasal spray for allergies over-the-counter, 1 spray each nostril daily for the next week or 2 to see if this helps the allergy symptoms such as sneezing drainage or congestion Begin famotidine also known as Pepcid either at nighttime or twice daily to help with acid reflux along with the Protonix you are already taking You can use Tums antacid tablets a few times daily as needed You can use Tessalon Perle cough drops as needed for cough.  This is a prescription Drink plenty water throughout the day Avoid foods that make acid reflux worse such as spicy foods, citrus, tomato-based foods, spicy foods, greasy foods Avoid eating and then going on lying down Do not rush through meals If the cough does not improve within the next 4 to 5 days, the next step would be a chest x-ray to rule out any worrisome cause of cough Also, if you continue to have problems with either cough or indigestion or potentially food getting stuck then we will need to follow-up with Dr. Elnoria Howard gastroenterology  I will reach out to Dr. Mayford Knife about your urinary frequency in regards to Farxiga to see if she is ok changing you to [place], a similar medication.

## 2022-08-11 NOTE — Progress Notes (Signed)
Subjective:  Caleb Thomas is a 62 y.o. male who presents for Chief Complaint  Patient presents with   Medical Management of Chronic Issues    Medication for Acid reflux isn't working. Having to sleep upward     Here for concerns about reflux, cough.  In the past when he had really bad acid reflux flaring up he would get a cough like this.  He denies fever or illness.  Coughing quite a bit for about a week or more.  He does get some sneezing, some nasal drainage and postnasal drainage.  He does not have a lot of heartburn per se.  He also notes history of esophagitis and acid reflux.  He takes Protonix in the morning daily already on empty stomach.  But given his past history he thinks his acid reflux is probably causing the cough.  He denies chest pain, shortness of breath, left-sided arm pain or shoulder pain, sweats.  He also notes that he urinates a lot.  But no abdominal pain, back pain, fever, penile discharge, testicle or penile pain or swelling.  No other aggravating or relieving factors.    No other c/o.  Past Medical History:  Diagnosis Date   CAD (coronary artery disease), native coronary artery 04/2018   s/p anterior STEMI with severe single-vessel CAD with occluded mid LAD and faint left-to-left collaterals.   Chronic systolic (congestive) heart failure (HCC)    Hyperlipidemia    Hypertension 2015   Ischemic cardiomyopathy    EF 25-30% by echo 01/2019 s/p AICD implant   LV (left ventricular) mural thrombus    noted on cardiac MRI but not on echo with contrast>>on chronic warfarin   Current Outpatient Medications on File Prior to Visit  Medication Sig Dispense Refill   atorvastatin (LIPITOR) 80 MG tablet Take 1 tablet (80 mg total) by mouth daily. 90 tablet 3   clopidogrel (PLAVIX) 75 MG tablet Take 1 tablet by mouth once daily 90 tablet 3   dapagliflozin propanediol (FARXIGA) 10 MG TABS tablet Take 1 tablet (10 mg total) by mouth daily. 90 tablet 3   icosapent Ethyl  (VASCEPA) 1 g capsule Take 2 capsules (2 g total) by mouth 2 (two) times daily. 180 capsule 3   metoprolol (TOPROL-XL) 200 MG 24 hr tablet TAKE 1 TABLET BY MOUTH ONCE DAILY (STOPPING  CARVEDILOL) 90 tablet 2   Multiple Vitamins-Minerals (CENTRUM ADULT PO) Take 1 tablet by mouth daily.     pantoprazole (PROTONIX) 40 MG tablet 1 tablet daily po 45 min prior to breakfast 90 tablet 3   sacubitril-valsartan (ENTRESTO) 97-103 MG Take 1 tablet by mouth 2 (two) times daily. 180 tablet 3   spironolactone (ALDACTONE) 25 MG tablet Take 1 tablet by mouth once daily 90 tablet 3   warfarin (COUMADIN) 5 MG tablet TAKE 1/2 TO 1 (ONE-HALF TO ONE) TABLET BY MOUTH ONCE DAILY OR  AS  DIRECETD  BY  ANTICOAGULATION  CLINIC 35 tablet 3   No current facility-administered medications on file prior to visit.     The following portions of the patient's history were reviewed and updated as appropriate: allergies, current medications, past family history, past medical history, past social history, past surgical history and problem list.  ROS Otherwise as in subjective above  Objective: BP 110/64   Pulse 80   Wt 207 lb 9.6 oz (94.2 kg)   BMI 31.57 kg/m   Wt Readings from Last 3 Encounters:  08/11/22 207 lb 9.6 oz (94.2 kg)  06/21/22  210 lb 12.8 oz (95.6 kg)  05/31/22 200 lb (90.7 kg)   General appearance: alert, no distress, well developed, well nourished HEENT: normocephalic, sclerae anicteric, conjunctiva pink and moist, TMs somewhat flat with reduced light reflex, nares with mild turbinate edema, and no discharge or erythema, pharynx normal Oral cavity: MMM, no lesions Neck: supple, no lymphadenopathy, no thyromegaly, no masses Heart: RRR, normal S1, S2, no murmurs Lungs: CTA bilaterally, no wheezes, rhonchi, or rales Abdomen: +bs, soft, non tender, non distended, no masses, no hepatomegaly, no splenomegaly Pulses: 2+ radial pulses, 2+ pedal pulses, normal cap refill Ext: no edema   EGD 01/2021 showed   Impression: LA grade D reflux esophagitis with no bleeding Benign-appearing esophageal stenosis 5 cm hiatal hernia Gastroesophageal flap valve classified as Hill grade 4  otherwise normal EGD    Assessment: Encounter Diagnoses  Name Primary?   Subacute cough Yes   Urinary frequency    Post-nasal drainage    High risk medication use      Plan: We discussed his concerns, symptoms, possible causes of cough.  Patient Instructions  Cough can be caused by numerous issues including allergies, postnasal drainage, acid reflux, asthma, pneumonia or other.  In your case the cough could be a combination of allergies and acid reflux   Recommendations Consider beginning Flonase nasal spray for allergies over-the-counter, 1 spray each nostril daily for the next week or 2 to see if this helps the allergy symptoms such as sneezing drainage or congestion Begin famotidine also known as Pepcid either at nighttime or twice daily to help with acid reflux along with the Protonix you are already taking You can use Tums antacid tablets a few times daily as needed You can use Tessalon Perle cough drops as needed for cough.  This is a prescription Drink plenty water throughout the day Avoid foods that make acid reflux worse such as spicy foods, citrus, tomato-based foods, spicy foods, greasy foods Avoid eating and then going on lying down Do not rush through meals If the cough does not improve within the next 4 to 5 days, the next step would be a chest x-ray to rule out any worrisome cause of cough Also, if you continue to have problems with either cough or indigestion or potentially food getting stuck then we will need to follow-up with Dr. Elnoria Howard gastroenterology  I will reach out to Dr. Mayford Knife about your urinary frequency in regards to Farxiga to see if she is ok changing you to [place], a similar medication.   Caleb Thomas was seen today for medical management of chronic issues.  Diagnoses and all  orders for this visit:  Subacute cough  Urinary frequency  Post-nasal drainage  High risk medication use  Other orders -     famotidine (PEPCID) 20 MG tablet; Once or twice daily for reflux -     benzonatate (TESSALON) 200 MG capsule; Take 1 capsule (200 mg total) by mouth 3 (three) times daily as needed for cough.    Follow up: 1 week call report

## 2022-08-12 MED ORDER — FAMOTIDINE 20 MG PO TABS: ORAL_TABLET | ORAL | 1 refills | Status: DC

## 2022-08-12 NOTE — Addendum Note (Signed)
Addended by: Herminio Commons A on: 08/12/2022 09:56 AM   Modules accepted: Orders

## 2022-08-17 ENCOUNTER — Telehealth: Payer: Self-pay

## 2022-08-17 ENCOUNTER — Ambulatory Visit (INDEPENDENT_AMBULATORY_CARE_PROVIDER_SITE_OTHER): Payer: Medicare PPO

## 2022-08-17 DIAGNOSIS — I5022 Chronic systolic (congestive) heart failure: Secondary | ICD-10-CM

## 2022-08-17 DIAGNOSIS — I255 Ischemic cardiomyopathy: Secondary | ICD-10-CM

## 2022-08-17 MED ORDER — EMPAGLIFLOZIN 10 MG PO TABS: 10.0000 mg | ORAL_TABLET | Freq: Every day | ORAL | 3 refills | Status: DC

## 2022-08-17 NOTE — Telephone Encounter (Signed)
Spoke to patient about changing from [place] to [place]. Patient agrees to trial jardiance, script sent to pharmacy of choice.

## 2022-08-17 NOTE — Telephone Encounter (Signed)
-----   Message from Quintella Reichert, MD sent at 08/11/2022  3:05 PM EDT ----- Alcario Drought please change patient's Marcelline Deist to Jardiance 10mg  daily to see if he has less issues with urination at night ----- Message ----- From: Jac Canavan, PA-C Sent: 08/11/2022   2:29 PM EDT To: Quintella Reichert, MD  Hello Dr. Mayford Knife  I saw our mutual patient today in regards to cough he has been having.  He also mentioned that he is urinating quite a few times throughout the day and night, more so than normal.  His urine shows no sign of infection.  I suspect it could be coming from [place].  I have had several patients that I started on Farxiga and after significant increase in urination to the point of the patient deciding not to continue the medication, we switched to [facility] and had much better tolerance.  I want to let you know in case you want to do anything different with the [place] but I suspect that is why he is urinating quite a bit.  Vincenza Hews

## 2022-08-23 ENCOUNTER — Telehealth: Payer: Self-pay | Admitting: Medical

## 2022-08-23 NOTE — Telephone Encounter (Signed)
Caleb Thomas called and wants to update you that the new medication is working well as far as frequent urination and the cough he had.

## 2022-08-29 LAB — CUP PACEART REMOTE DEVICE CHECK
Date Time Interrogation Session: 20240628123658
Implantable Lead Connection Status: 753985
Implantable Lead Implant Date: 20201210
Implantable Lead Location: 753860
Implantable Lead Model: 436909
Implantable Lead Serial Number: 8102423
Implantable Pulse Generator Implant Date: 20201210
Pulse Gen Model: 429525
Pulse Gen Serial Number: 84745362

## 2022-09-05 ENCOUNTER — Ambulatory Visit: Payer: Medicare PPO | Admitting: *Deleted

## 2022-09-05 DIAGNOSIS — I2102 ST elevation (STEMI) myocardial infarction involving left anterior descending coronary artery: Secondary | ICD-10-CM

## 2022-09-05 DIAGNOSIS — Z5181 Encounter for therapeutic drug level monitoring: Secondary | ICD-10-CM | POA: Diagnosis not present

## 2022-09-05 DIAGNOSIS — I513 Intracardiac thrombosis, not elsewhere classified: Secondary | ICD-10-CM | POA: Diagnosis not present

## 2022-09-05 LAB — POCT INR: INR: 2.6 (ref 2.0–3.0)

## 2022-09-05 NOTE — Patient Instructions (Signed)
Description   Continue taking warfarin 1 tablet daily except 1/2 tablet on Sundays and Thursdays.  Recheck INR in 6 weeks.  Coumadin Clinic [phone] or [phone] Main [phone]

## 2022-10-17 ENCOUNTER — Ambulatory Visit: Payer: Medicare PPO | Attending: Cardiology | Admitting: *Deleted

## 2022-10-17 DIAGNOSIS — I513 Intracardiac thrombosis, not elsewhere classified: Secondary | ICD-10-CM | POA: Diagnosis not present

## 2022-10-17 DIAGNOSIS — Z5181 Encounter for therapeutic drug level monitoring: Secondary | ICD-10-CM

## 2022-10-17 DIAGNOSIS — I2102 ST elevation (STEMI) myocardial infarction involving left anterior descending coronary artery: Secondary | ICD-10-CM

## 2022-10-17 LAB — POCT INR: INR: 3.5 — AB (ref 2.0–3.0)

## 2022-10-17 NOTE — Patient Instructions (Addendum)
  Description   Do not take any warfarin today then continue taking warfarin 1 tablet daily except 1/2 tablet on Sundays and Thursdays. Recheck INR in 5 weeks.  Coumadin Clinic [phone] or [phone] Main [phone]

## 2022-10-20 ENCOUNTER — Ambulatory Visit: Payer: Medicare PPO | Attending: Cardiology | Admitting: Cardiology

## 2022-10-20 ENCOUNTER — Encounter: Payer: Self-pay | Admitting: Cardiology

## 2022-10-20 VITALS — BP 120/84 | HR 90 | Ht 68.0 in | Wt 208.6 lb

## 2022-10-20 DIAGNOSIS — I255 Ischemic cardiomyopathy: Secondary | ICD-10-CM

## 2022-10-20 DIAGNOSIS — I2510 Atherosclerotic heart disease of native coronary artery without angina pectoris: Secondary | ICD-10-CM

## 2022-10-20 DIAGNOSIS — I10 Essential (primary) hypertension: Secondary | ICD-10-CM

## 2022-10-20 DIAGNOSIS — I5022 Chronic systolic (congestive) heart failure: Secondary | ICD-10-CM | POA: Diagnosis not present

## 2022-10-20 DIAGNOSIS — E7800 Pure hypercholesterolemia, unspecified: Secondary | ICD-10-CM | POA: Diagnosis not present

## 2022-10-20 DIAGNOSIS — I513 Intracardiac thrombosis, not elsewhere classified: Secondary | ICD-10-CM | POA: Diagnosis not present

## 2022-10-20 NOTE — Progress Notes (Signed)
Cardiology Office Note:    Date:  10/20/2022   ID:  Caleb Thomas, DOB 10-09-1960, MRN [id number]  PCP:  Jac Canavan, PA-C  Cardiologist:  Armanda Magic, MD    Referring MD: Jac Canavan, PA-C   Chief Complaint  Patient presents with   Coronary Artery Disease   Hypertension   Hyperlipidemia   Cardiomyopathy   Congestive Heart Failure     History of Present Illness:    Caleb Thomas is a 62 y.o. male with a hx of hyperlipidemia, hypertension, remote history of tobacco and ASCAD with admission 04/2018 with Anterior STEMI. Cath showed severe single-vessel CAD with occluded mid LAD and faint left-to-left collaterals. Given onset of symptoms 3-4 days ago and lack of ongoing chest pain,  recommended medical therapy, including 12 months of dual antiplatelet therapy with aspirin and clopidogrel. EF at that time was 25% with dilated LV and anteroapical severe hypokinesis. Echo with contrast did not show LV thrombus but follow up cardiac MRI confirmed an LV thrombus and he was started on coumadin and LifeVest was placed.    Followup 2D echo on max GDMT showed persistence of LV dysfunction with EF 25-30% .  He underwent AICD placement for primary prevention in 01/2019 and is followed in Device clinic by Dr. Ladona Ridgel.    He is here today for followup and is doing well.  He denies any chest pain or pressure, SOB, DOE, PND, orthopnea, LE edema, dizziness, palpitations or syncope. He is compliant with his meds and is tolerating meds with no SE.    Past Medical History:  Diagnosis Date   CAD (coronary artery disease), native coronary artery 04/2018   s/p anterior STEMI with severe single-vessel CAD with occluded mid LAD and faint left-to-left collaterals.   Chronic systolic (congestive) heart failure (HCC)    Hyperlipidemia    Hypertension 2015   Ischemic cardiomyopathy    EF 25-30% by echo 01/2019 s/p AICD implant   LV (left ventricular) mural thrombus    noted on cardiac MRI but not on  echo with contrast>>on chronic warfarin    Past Surgical History:  Procedure Laterality Date   COLONOSCOPY  2013   "normal" per patient, [place]   COLONOSCOPY WITH PROPOFOL N/A 02/26/2021   Procedure: COLONOSCOPY WITH PROPOFOL;  Surgeon: Jeani Hawking, MD;  Location: [facility] ENDOSCOPY;  Service: Endoscopy;  Laterality: N/A;   ESOPHAGOGASTRODUODENOSCOPY (EGD) WITH PROPOFOL N/A 02/26/2021   Procedure: ESOPHAGOGASTRODUODENOSCOPY (EGD) WITH PROPOFOL;  Surgeon: Jeani Hawking, MD;  Location: [facility] ENDOSCOPY;  Service: Endoscopy;  Laterality: N/A;   ICD IMPLANT N/A 02/07/2019   Procedure: ICD IMPLANT;  Surgeon: Marinus Maw, MD;  Location: [facility] INVASIVE CV LAB;  Service: Cardiovascular;  Laterality: N/A;   LEFT HEART CATH AND CORONARY ANGIOGRAPHY N/A 05/06/2018   Procedure: LEFT HEART CATH AND CORONARY ANGIOGRAPHY;  Surgeon: Yvonne Kendall, MD;  Location: [facility] INVASIVE CV LAB;  Service: Cardiovascular;  Laterality: N/A;   POLYPECTOMY  02/26/2021   Procedure: POLYPECTOMY;  Surgeon: Jeani Hawking, MD;  Location: [facility] ENDOSCOPY;  Service: Endoscopy;;    Current Medications: Current Meds  Medication Sig   atorvastatin (LIPITOR) 80 MG tablet Take 1 tablet (80 mg total) by mouth daily.   clopidogrel (PLAVIX) 75 MG tablet Take 1 tablet by mouth once daily   empagliflozin (JARDIANCE) 10 MG TABS tablet Take 1 tablet (10 mg total) by mouth daily before breakfast.   famotidine (PEPCID) 20 MG tablet Take 1-2 times daily as needed   icosapent Ethyl (VASCEPA) 1  g capsule Take 2 capsules (2 g total) by mouth 2 (two) times daily.   metoprolol (TOPROL-XL) 200 MG 24 hr tablet TAKE 1 TABLET BY MOUTH ONCE DAILY (STOPPING  CARVEDILOL)   Multiple Vitamins-Minerals (CENTRUM ADULT PO) Take 1 tablet by mouth daily.   pantoprazole (PROTONIX) 40 MG tablet 1 tablet daily po 45 min prior to breakfast   sacubitril-valsartan (ENTRESTO) 97-103 MG Take 1 tablet by mouth 2 (two) times daily.   spironolactone (ALDACTONE) 25 MG tablet  Take 1 tablet by mouth once daily   warfarin (COUMADIN) 5 MG tablet TAKE 1/2 TO 1 (ONE-HALF TO ONE) TABLET BY MOUTH ONCE DAILY OR  AS  DIRECETD  BY  ANTICOAGULATION  CLINIC     Allergies:   Patient has no known allergies.   Social History   Socioeconomic History   Marital status: Married    Spouse name: Not on file   Number of children: Not on file   Years of education: Not on file   Highest education level: Not on file  Occupational History   Not on file  Tobacco Use   Smoking status: Former   Smokeless tobacco: Never   Tobacco comments:    quit smoking 62 years old ago  Vaping Use   Vaping status: Never Used  Substance and Sexual Activity   Alcohol use: Not Currently    Alcohol/week: 3.0 standard drinks of alcohol    Types: 3 Cans of beer per week    Comment: on weekend    Drug use: Not Currently   Sexual activity: Not on file  Other Topics Concern   Not on file  Social History Narrative   Married, 10 children, 4 grandchildren.  Former Engineer, civil (consulting) at [employer].  Walks for exercise.   Disabled   05/2022   Social Determinants of Health   Financial Resource Strain: Low Risk  (05/31/2022)   Overall Financial Resource Strain (CARDIA)    Difficulty of Paying Living Expenses: Not hard at all  Food Insecurity: No Food Insecurity (05/31/2022)   Hunger Vital Sign    Worried About Running Out of Food in the Last Year: Never true    Ran Out of Food in the Last Year: Never true  Transportation Needs: No Transportation Needs (05/31/2022)   PRAPARE - Administrator, Civil Service (Medical): No    Lack of Transportation (Non-Medical): No  Physical Activity: Sufficiently Active (05/31/2022)   Exercise Vital Sign    Days of Exercise per Week: 7 days    Minutes of Exercise per Session: 30 min  Stress: No Stress Concern Present (05/31/2022)   [employer] of Occupational Health - Occupational Stress Questionnaire    Feeling of Stress : Not at all  Social Connections:  Not on file     Family History: The patient's family history includes Heart disease in his father; Hypertension in his brother and brother; Stroke in his brother and mother. There is no history of Cancer.  ROS:   Please see the history of present illness.    ROS  All other systems reviewed and negative.   EKGs/Labs/Other Studies Reviewed:    The following studies were reviewed today:  2D echo 10/2018 IMPRESSIONS    1. The left ventricle has severely reduced systolic function, with an  ejection fraction of 25-30%. The cavity size was normal. Severe basal  septal hypertrophy. Left ventricular diastolic Doppler parameters are  consistent with pseudonormalization.   2. There is akinessis of the mid  anteroseptal and inferoseptal, apical  and apical lateral walls. There is akinesis of the mid and apical  inferior, anterior and apical inferolateral walls. No evidence of apical  thrombus by definity contrast study.   3. The right ventricle has normal systolic function. The cavity was  normal. There is no increase in right ventricular wall thickness. Right  ventricular systolic pressure is normal with an estimated pressure of 32.3  mmHg.   4. Left atrial size was mildly dilated.   5. The aortic valve is tricuspid. Moderate sclerosis of the aortic valve.  Aortic valve regurgitation is trivial by color flow Doppler.   6. The aorta is normal unless otherwise noted.    Recent Labs: 04/19/2022: ALT 37; BUN 15; Creatinine, Ser 1.25; Hemoglobin 17.9; Platelets 260; Potassium 4.4; Sodium 137 06/21/2022: TSH 2.210   Recent Lipid Panel    Component Value Date/Time   CHOL 87 (L) 04/22/2022 0842   TRIG 201 (H) 04/22/2022 0842   HDL 25 (L) 04/22/2022 0842   CHOLHDL 3.5 04/22/2022 0842   CHOLHDL 9.5 05/07/2018 0654   VLDL 40 05/07/2018 0654   LDLCALC 30 04/22/2022 0842   LDLCALC 85 03/06/2017 1138    Physical Exam:    VS:  BP 120/84   Pulse 90   Ht 5\' 8"  (1.727 m)   Wt 208 lb 9.6 oz  (94.6 kg)   SpO2 97%   BMI 31.72 kg/m     Wt Readings from Last 3 Encounters:  10/20/22 208 lb 9.6 oz (94.6 kg)  08/11/22 207 lb 9.6 oz (94.2 kg)  06/21/22 210 lb 12.8 oz (95.6 kg)    GEN: Well nourished, well developed in no acute distress HEENT: Normal NECK: No JVD; No carotid bruits LYMPHATICS: No lymphadenopathy CARDIAC:RRR, no murmurs, rubs, gallops RESPIRATORY:  Clear to auscultation without rales, wheezing or rhonchi  ABDOMEN: Soft, non-tender, non-distended MUSCULOSKELETAL:  No edema; No deformity  SKIN: Warm and dry NEUROLOGIC:  Alert and oriented x 3 PSYCHIATRIC:  Normal affect   ASSESSMENT:    1. Coronary artery disease involving native coronary artery of native heart without angina pectoris   2. Ischemic cardiomyopathy   3. Chronic systolic (congestive) heart failure (HCC)   4. Essential hypertension   5. Pure hypercholesterolemia   6. LV (left ventricular) mural thrombus     PLAN:    In order of problems listed above:  1.  ASCAD -s/p Anterior STEMI 04/2018 with cath showing severe single-vessel CAD with occluded mid LAD and faint left-to-left collaterals. Given onset of symptoms 3-4 days ago and lack of ongoing chest pain,  recommended medical therapy -He denies any anginal symptoms since his last office visit -Continue prescription drug management with Plavix 75 mg daily, atorvastatin 80 mg daily, Toprol-XL 200 mg daily with as needed refills -No ASA due to need for warfarin for history of LV thrombus  2.  Ischemic DCM -EF 25-30% by echo 01/2019 s/p AICD for primary prevention -Repeat 2D echo 01/2021 showed EF 30 to 35% with akinesis of the mid to apical septal wall, anterior wall and inferior wall with no LV thrombus by Definity -He is on GDMT with Jardiance, Toprol, Entresto and spironolactone  3.  Chronic systolic CHF -last EF assessment 25-30% -s/p AICD followed in device clinic by EP -He appears euvolemic on exam today -Continue drug management  with Jardiance 10 mg daily, Toprol-XL 200 mg daily, Entresto 97-23 mg twice daily and spironolactone 25 mg daily with as needed refills -I have personally reviewed  and interpreted outside labs performed by patient's PCP which showed serum creatinine 1.25 and potassium 4.4 on 04/19/2022  4.  HTN -BP is adequately controlled on exam today -Continue drug management with Toprol-XL 200 mg daily and Entresto 97-23 mg twice daily and spironolactone 25 mg daily with as needed refills  5.  HLD -LDL goal < 70 -I have personally reviewed and interpreted outside labs performed by patient's PCP which showed LDL was 30 and HDL 25 on 04/22/2022 ALT 37 -Continue prescription drug management with atorvastatin 80 mg daily and Vascepa 2 g twice daily with as needed refills  6. LV thrombus -found to have LV thrombus on Cardiac MRI - not noted on echo with contrast and is on warfarin -INR followed in coumadin clinic -He denies any bleeding problems since I saw him last -I have personally reviewed and interpreted outside labs performed by patient's PCP which showed hemoglobin 17 on 06/03/2021   Medication Adjustments/Labs and Tests Ordered: Current medicines are reviewed at length with the patient today.  Concerns regarding medicines are outlined above.  No orders of the defined types were placed in this encounter.   No orders of the defined types were placed in this encounter.  Followup with me in 6 months  Signed, Armanda Magic, MD  10/20/2022 9:40 AM    [facility]

## 2022-10-20 NOTE — Patient Instructions (Signed)
Medication Instructions:  Your physician recommends that you continue on your current medications as directed. Please refer to the Current Medication list given to you today.  *If you need a refill on your cardiac medications before your next appointment, please call your pharmacy*   Lab Work: None. If you have labs (blood work) drawn today and your tests are completely normal, you will receive your results only by: MyChart Message (if you have MyChart) OR A paper copy in the mail If you have any lab test that is abnormal or we need to change your treatment, we will call you to review the results.   Testing/Procedures: None.   Follow-Up: At [facility], you and your health needs are our priority.  As part of our continuing mission to provide you with exceptional heart care, we have created designated Provider Care Teams.  These Care Teams include your primary Cardiologist (physician) and Advanced Practice Providers (APPs -  Physician Assistants and Nurse Practitioners) who all work together to provide you with the care you need, when you need it.  We recommend signing up for the patient portal called "MyChart".  Sign up information is provided on this After Visit Summary.  MyChart is used to connect with patients for Virtual Visits (Telemedicine).  Patients are able to view lab/test results, encounter notes, upcoming appointments, etc.  Non-urgent messages can be sent to your provider as well.   To learn more about what you can do with MyChart, go to [email or web address].    Your next appointment:   6 month(s)  Provider:   Traci Turner, MD      

## 2022-10-23 ENCOUNTER — Other Ambulatory Visit: Payer: Self-pay | Admitting: Internal Medicine

## 2022-10-23 DIAGNOSIS — E7800 Pure hypercholesterolemia, unspecified: Secondary | ICD-10-CM

## 2022-10-25 MED ORDER — ICOSAPENT ETHYL 1 G PO CAPS
2.0000 g | ORAL_CAPSULE | Freq: Two times a day (BID) | ORAL | 3 refills | Status: DC
Start: 2022-10-25 — End: 2023-04-24

## 2022-11-21 ENCOUNTER — Ambulatory Visit: Payer: Medicare PPO | Attending: Cardiology

## 2022-11-21 DIAGNOSIS — Z5181 Encounter for therapeutic drug level monitoring: Secondary | ICD-10-CM | POA: Diagnosis not present

## 2022-11-21 DIAGNOSIS — I513 Intracardiac thrombosis, not elsewhere classified: Secondary | ICD-10-CM

## 2022-11-21 LAB — POCT INR: INR: 3.3 — AB (ref 2.0–3.0)

## 2022-11-21 NOTE — Patient Instructions (Signed)
Description   Do not take any warfarin today then continue taking warfarin 1 tablet daily except 1/2 tablet on Sundays and Thursdays. Recheck INR in 4 weeks.  Coumadin Clinic [phone] or [phone] Main [phone]

## 2022-11-22 NOTE — Progress Notes (Signed)
Remote ICD transmission.   

## 2022-11-25 ENCOUNTER — Ambulatory Visit (INDEPENDENT_AMBULATORY_CARE_PROVIDER_SITE_OTHER): Payer: Medicare PPO

## 2022-11-25 DIAGNOSIS — I255 Ischemic cardiomyopathy: Secondary | ICD-10-CM

## 2022-11-25 LAB — CUP PACEART REMOTE DEVICE CHECK
Date Time Interrogation Session: 20240927091223
Implantable Lead Connection Status: 753985
Implantable Lead Implant Date: 20201210
Implantable Lead Location: 753860
Implantable Lead Model: 436909
Implantable Lead Serial Number: 8102423
Implantable Pulse Generator Implant Date: 20201210
Pulse Gen Model: 429525
Pulse Gen Serial Number: 84745362

## 2022-12-01 NOTE — Progress Notes (Signed)
Remote ICD transmission.   

## 2022-12-14 NOTE — Progress Notes (Unsigned)
  Electrophysiology Office Note:   Date:  12/15/2022  ID:  Lillette Boxer, DOB [birth year], MRN [id number]  Primary Cardiologist: Armanda Magic, MD Electrophysiologist: Lewayne Bunting, MD      History of Present Illness:   Caleb Thomas is a 62 y.o. male with h/o CAD s/p STEMI, ICM / systolic CHF s/p ICD, LV mural thrombus, HTN, HLD, & obesity seen today for routine electrophysiology followup.   Last remote device check 10/2022 showed normal device check, appropriate histograms, leads/battery stable per Dr. Ladona Ridgel.   Last EP visit 11/2021 found him doing well with no ICD shocks.   Since last being seen in our clinic the patient reports doing very well. He walks on his treadmill. He states he is grateful he feels well as he remembers a time of not feeling good. He can climb the stairs in his home without difficulty. He has not had any shocks on his device. Reports he was taken off [place] and changed to [place]. Has been feeling great.    He denies chest pain, palpitations, dyspnea, PND, orthopnea, nausea, vomiting, dizziness, syncope, edema, weight gain, or early satiety.   Review of systems complete and found to be negative unless listed in HPI.    EP Information / Studies Reviewed:    EKG is ordered today. Personal review as below.  EKG Interpretation Date/Time:  Thursday December 15 2022 09:20:03 EDT Ventricular Rate:  77 PR Interval:  182 QRS Duration:  112 QT Interval:  378 QTC Calculation: 427 R Axis:   -81  Text Interpretation: Sinus rhythm with occasional Premature ventricular complexes Left axis deviation Confirmed by Canary Brim ([id number]) on 12/15/2022 9:28:46 AM   ICD Interrogation-  reviewed in detail today,  See PACEART report.  Device History: Biotronik Single Chamber ICD implanted 02/07/2019 for ICM History of appropriate therapy: No History of AAD therapy: No   Risk Assessment/Calculations:              Physical Exam:   VS:  BP 116/80   Pulse 77   Ht 5\' 8"   (1.727 m)   Wt 209 lb 6.4 oz (95 kg)   SpO2 97%   BMI 31.84 kg/m    Wt Readings from Last 3 Encounters:  12/15/22 209 lb 6.4 oz (95 kg)  10/20/22 208 lb 9.6 oz (94.6 kg)  08/11/22 207 lb 9.6 oz (94.2 kg)     GEN: Well nourished, well developed in no acute distress NECK: No JVD; No carotid bruits CARDIAC: Regular rate and rhythm, no murmurs, rubs, gallops RESPIRATORY:  Clear to auscultation without rales, wheezing or rhonchi  ABDOMEN: Soft, non-tender, non-distended EXTREMITIES:  No edema; No deformity   ASSESSMENT AND PLAN:    Chronic Systolic Dysfunction / ICM s/p Biotronik single chamber ICD  -euvolemic today -stable on an appropriate medical regimen -normal ICD function -see Pace Art report -no changes today -GDMT per primary Cardiology   CAD  HTN -no anginal symptoms   LV Mural Thrombus  Secondary Hypercoagulable State -warfarin per Coumadin Clinic  HLD  -continue statin    Disposition:   Follow up with Dr. Ladona Ridgel in 12 months   Signed, Canary Brim, MSN, APRN, NP-C, AGACNP-BC [facility] - Electrophysiology  12/15/2022, 9:45 AM

## 2022-12-15 ENCOUNTER — Encounter: Payer: Self-pay | Admitting: Student

## 2022-12-15 ENCOUNTER — Ambulatory Visit: Payer: Medicare PPO | Attending: Student | Admitting: Pulmonary Disease

## 2022-12-15 VITALS — BP 116/80 | HR 77 | Ht 68.0 in | Wt 209.4 lb

## 2022-12-15 DIAGNOSIS — I513 Intracardiac thrombosis, not elsewhere classified: Secondary | ICD-10-CM | POA: Diagnosis not present

## 2022-12-15 DIAGNOSIS — D6869 Other thrombophilia: Secondary | ICD-10-CM

## 2022-12-15 DIAGNOSIS — I10 Essential (primary) hypertension: Secondary | ICD-10-CM

## 2022-12-15 DIAGNOSIS — E7800 Pure hypercholesterolemia, unspecified: Secondary | ICD-10-CM

## 2022-12-15 DIAGNOSIS — I255 Ischemic cardiomyopathy: Secondary | ICD-10-CM | POA: Diagnosis not present

## 2022-12-15 DIAGNOSIS — I2510 Atherosclerotic heart disease of native coronary artery without angina pectoris: Secondary | ICD-10-CM

## 2022-12-15 DIAGNOSIS — I5022 Chronic systolic (congestive) heart failure: Secondary | ICD-10-CM | POA: Diagnosis not present

## 2022-12-15 LAB — CUP PACEART INCLINIC DEVICE CHECK
Date Time Interrogation Session: 20241017095335
Implantable Lead Connection Status: 753985
Implantable Lead Implant Date: 20201210
Implantable Lead Location: 753860
Implantable Lead Model: 436909
Implantable Lead Serial Number: 8102423
Implantable Pulse Generator Implant Date: 20201210
Pulse Gen Model: 429525
Pulse Gen Serial Number: 84745362

## 2022-12-15 NOTE — Patient Instructions (Signed)
Medication Instructions:  Your physician recommends that you continue on your current medications as directed. Please refer to the Current Medication list given to you today.  *If you need a refill on your cardiac medications before your next appointment, please call your pharmacy*  Lab Work: None ordered If you have labs (blood work) drawn today and your tests are completely normal, you will receive your results only by: MyChart Message (if you have MyChart) OR A paper copy in the mail If you have any lab test that is abnormal or we need to change your treatment, we will call you to review the results.  Follow-Up: At [facility], you and your health needs are our priority.  As part of our continuing mission to provide you with exceptional heart care, we have created designated Provider Care Teams.  These Care Teams include your primary Cardiologist (physician) and Advanced Practice Providers (APPs -  Physician Assistants and Nurse Practitioners) who all work together to provide you with the care you need, when you need it.  Your next appointment:   1 year(s)  Provider:   Gregg Taylor, MD  

## 2022-12-19 ENCOUNTER — Ambulatory Visit: Payer: Medicare PPO | Attending: Cardiovascular Disease

## 2022-12-19 DIAGNOSIS — Z5181 Encounter for therapeutic drug level monitoring: Secondary | ICD-10-CM | POA: Diagnosis not present

## 2022-12-19 DIAGNOSIS — I513 Intracardiac thrombosis, not elsewhere classified: Secondary | ICD-10-CM | POA: Diagnosis not present

## 2022-12-19 DIAGNOSIS — I2102 ST elevation (STEMI) myocardial infarction involving left anterior descending coronary artery: Secondary | ICD-10-CM

## 2022-12-19 LAB — POCT INR: POC INR: 2.7

## 2022-12-19 NOTE — Patient Instructions (Signed)
Description   Continue taking warfarin 1 tablet daily except 1/2 tablet on Sundays and Thursdays. Recheck INR in 4 weeks.  Coumadin Clinic [phone] or [phone] Main [phone]

## 2022-12-22 ENCOUNTER — Telehealth: Payer: Self-pay | Admitting: Cardiology

## 2022-12-22 NOTE — Telephone Encounter (Signed)
Patient is aware he can drop off paperwork.

## 2022-12-22 NOTE — Telephone Encounter (Signed)
Yes, patient can leave paperwork at front desk.

## 2022-12-22 NOTE — Telephone Encounter (Signed)
New Message:    Patient wants to know if he can drop off his paperwork for his patient's assistance for his Sherryll Burger please?

## 2022-12-28 ENCOUNTER — Other Ambulatory Visit (HOSPITAL_COMMUNITY): Payer: Self-pay

## 2022-12-28 ENCOUNTER — Telehealth: Payer: Self-pay

## 2022-12-28 ENCOUNTER — Telehealth: Payer: Self-pay | Admitting: Cardiology

## 2022-12-28 MED ORDER — ENTRESTO 97-103 MG PO TABS: 1.0000 | ORAL_TABLET | Freq: Two times a day (BID) | ORAL | 3 refills | Status: DC

## 2022-12-28 NOTE — Telephone Encounter (Signed)
Patient Advocate Encounter   The patient was approved for a [employer] grant that will help cover the cost of ENTRESTO Total amount awarded, $10,000.  Effective: 11/28/22 - 11/27/23   [id number] WUJ:[id number] [id number] [id number]  Haze Rushing, CPhT  Pharmacy Patient Advocate Specialist  Direct Number: [phone] Fax: [phone]

## 2022-12-28 NOTE — Telephone Encounter (Signed)
Pt's [employer] patient assistance application was scanned to [employer], [facility] email. FYI

## 2022-12-28 NOTE — Telephone Encounter (Signed)
RX sent in with the added grant information.

## 2022-12-28 NOTE — Telephone Encounter (Signed)
kestin conatser came in and dropped off patient assistance for [employer] on 12/28/2022 . Placed in red folder in front admin area . Thank you

## 2022-12-28 NOTE — Telephone Encounter (Signed)
Please send in prescription for ENTRESTO to [facility] - [place] ([place]), [place] - [address]   including grant billing information in RX comment ([id number], [id number], Group: [id number], [id number])

## 2023-01-03 NOTE — Telephone Encounter (Signed)
Pt calling with additional questions regarding pt asst, rec'vd my chart message. Requesting cb

## 2023-01-05 MED ORDER — ENTRESTO 97-103 MG PO TABS: 1.0000 | ORAL_TABLET | Freq: Two times a day (BID) | ORAL | 3 refills | Status: DC

## 2023-01-05 NOTE — Telephone Encounter (Signed)
[facility] did not receive RX for [employer].  Please send in prescription for ENTRESTO to [facility] - [place] ([place]), [place] - [address]   including grant billing information in RX comment ([id number], [id number], Group: [id number], [id number])

## 2023-01-05 NOTE — Addendum Note (Signed)
Addended by: Luellen Pucker on: 01/05/2023 09:35 AM   Modules accepted: Orders

## 2023-01-05 NOTE — Telephone Encounter (Signed)
Resubmitted order for entresto with grant information to [facility] at [employer].

## 2023-01-16 ENCOUNTER — Ambulatory Visit: Payer: Medicare PPO | Attending: Cardiology

## 2023-01-16 DIAGNOSIS — I513 Intracardiac thrombosis, not elsewhere classified: Secondary | ICD-10-CM

## 2023-01-16 DIAGNOSIS — I2102 ST elevation (STEMI) myocardial infarction involving left anterior descending coronary artery: Secondary | ICD-10-CM

## 2023-01-16 DIAGNOSIS — Z5181 Encounter for therapeutic drug level monitoring: Secondary | ICD-10-CM | POA: Diagnosis not present

## 2023-01-16 LAB — POCT INR: INR: 3.1 — AB (ref 2.0–3.0)

## 2023-01-16 NOTE — Patient Instructions (Signed)
Continue taking warfarin 1 tablet daily except 1/2 tablet on Sundays and Thursdays. Recheck INR in 6 weeks. EAT [place] TODAY. Coumadin Clinic [phone] or [phone] Main [phone]

## 2023-01-27 ENCOUNTER — Other Ambulatory Visit: Payer: Self-pay | Admitting: Medical

## 2023-02-17 ENCOUNTER — Other Ambulatory Visit: Payer: Self-pay | Admitting: Cardiology

## 2023-02-17 DIAGNOSIS — I513 Intracardiac thrombosis, not elsewhere classified: Secondary | ICD-10-CM

## 2023-02-24 ENCOUNTER — Ambulatory Visit (INDEPENDENT_AMBULATORY_CARE_PROVIDER_SITE_OTHER): Payer: Medicare PPO

## 2023-02-24 DIAGNOSIS — I255 Ischemic cardiomyopathy: Secondary | ICD-10-CM | POA: Diagnosis not present

## 2023-02-27 ENCOUNTER — Ambulatory Visit: Payer: Medicare PPO | Attending: Internal Medicine

## 2023-02-27 DIAGNOSIS — Z5181 Encounter for therapeutic drug level monitoring: Secondary | ICD-10-CM

## 2023-02-27 DIAGNOSIS — I2102 ST elevation (STEMI) myocardial infarction involving left anterior descending coronary artery: Secondary | ICD-10-CM

## 2023-02-27 DIAGNOSIS — I513 Intracardiac thrombosis, not elsewhere classified: Secondary | ICD-10-CM

## 2023-02-27 LAB — CUP PACEART REMOTE DEVICE CHECK
Date Time Interrogation Session: 20241227070102
Implantable Lead Connection Status: 753985
Implantable Lead Implant Date: 20201210
Implantable Lead Location: 753860
Implantable Lead Model: 436909
Implantable Lead Serial Number: 8102423
Implantable Pulse Generator Implant Date: 20201210
Pulse Gen Model: 429525
Pulse Gen Serial Number: 84745362

## 2023-02-27 LAB — POCT INR: INR: 3.5 — AB (ref 2.0–3.0)

## 2023-02-27 NOTE — Patient Instructions (Signed)
HOLD TODAY ONLY THEN DECREASE TO  1 tablet daily except 1/2 tablet on Monday, Wednesday and Friday. Recheck INR in 3 weeks.  Coumadin Clinic [phone] or [phone] Main [phone]

## 2023-03-15 ENCOUNTER — Telehealth: Payer: Self-pay | Admitting: Cardiology

## 2023-03-15 NOTE — Telephone Encounter (Signed)
 Pt c/o medication issue:  1. Name of Medication:   icosapent  Ethyl (VASCEPA ) 1 g capsule   2. How are you currently taking this medication (dosage and times per day)?   As prescribed  3. Are you having a reaction (difficulty breathing--STAT)?   No  4. What is your medication issue?   Patient stated he is unable to afford this medication and wants assistance getting this medication.

## 2023-03-16 ENCOUNTER — Other Ambulatory Visit (HOSPITAL_COMMUNITY): Payer: Self-pay

## 2023-03-16 ENCOUNTER — Telehealth: Payer: Self-pay | Admitting: Pharmacy Technician

## 2023-03-16 NOTE — Telephone Encounter (Signed)
Hello, patient was already approved for a healthwell grant under cardiomyopathy so he can use that same grant info to cover the vascepa.   Patient Advocate Encounter   The patient was approved for a [employer] grant Total amount awarded, $10,000. -he has [phone] left Effective: 11/28/22 - 11/27/23   [id number] FUX:[id number] UKGUR:[id number]

## 2023-03-16 NOTE — Telephone Encounter (Signed)
Received message patient needed assistance with vascepa copay. I replied with: Hello, patient was already approved for a healthwell grant under cardiomyopathy so he can use that same grant info to cover the vascepa.   Patient Advocate Encounter   The patient was approved for a [employer] grant Total amount awarded, $10,000. -he has [phone] left Effective: 11/28/22 - 11/27/23   [id number] WUJ:[id number] [id number] [id number]

## 2023-03-20 ENCOUNTER — Ambulatory Visit: Payer: Medicare PPO | Attending: Cardiovascular Disease

## 2023-03-20 DIAGNOSIS — I513 Intracardiac thrombosis, not elsewhere classified: Secondary | ICD-10-CM | POA: Diagnosis not present

## 2023-03-20 DIAGNOSIS — I2102 ST elevation (STEMI) myocardial infarction involving left anterior descending coronary artery: Secondary | ICD-10-CM | POA: Diagnosis not present

## 2023-03-20 DIAGNOSIS — Z5181 Encounter for therapeutic drug level monitoring: Secondary | ICD-10-CM

## 2023-03-20 LAB — POCT INR: INR: 2.2 (ref 2.0–3.0)

## 2023-03-20 NOTE — Patient Instructions (Signed)
Continue 1 tablet daily except 1/2 tablet on Monday, Wednesday and Friday. Recheck INR in 4 weeks.  Coumadin Clinic [phone] or [phone] Main [phone]

## 2023-03-21 ENCOUNTER — Other Ambulatory Visit (HOSPITAL_COMMUNITY): Payer: Self-pay

## 2023-03-21 NOTE — Telephone Encounter (Signed)
The grant that you gave is a cardiomyopathy grant and cannt be used for cholesterol meds. I enrolled him in the hypercholesterolemia fund. Gave info to [facility]. Cost now $0. Patient made aware.  Card No. [id number] Card Status Active BIN [id number] PCN [id number] PC Group [id number]

## 2023-03-21 NOTE — Telephone Encounter (Signed)
Call to patient who states he has not been able to pick up vascepa due to cost. Doctor, general practice Pharmacy at [facility] and had them run [employer] and they state info is coming back denied. Forwarded to medication assistance team.

## 2023-03-21 NOTE — Telephone Encounter (Signed)
Edit:  The grant that you gave is a cardiomyopathy grant and cannt be used for cholesterol meds. I enrolled him in the hypercholesterolemia fund. Gave info to [facility]. Cost now $0. Patient made aware.   Card No. [id number] Card Status Active BIN [id number] PCN [id number] PC Group [id number]

## 2023-03-31 ENCOUNTER — Other Ambulatory Visit: Payer: Self-pay | Admitting: Cardiology

## 2023-03-31 DIAGNOSIS — I513 Intracardiac thrombosis, not elsewhere classified: Secondary | ICD-10-CM

## 2023-03-31 NOTE — Progress Notes (Signed)
 Remote ICD transmission.

## 2023-03-31 NOTE — Addendum Note (Signed)
Addended by: Elease Etienne A on: 03/31/2023 09:51 AM   Modules accepted: Orders

## 2023-04-08 ENCOUNTER — Other Ambulatory Visit: Payer: Self-pay | Admitting: Cardiology

## 2023-04-08 DIAGNOSIS — I513 Intracardiac thrombosis, not elsewhere classified: Secondary | ICD-10-CM

## 2023-04-17 ENCOUNTER — Ambulatory Visit: Payer: Medicare PPO | Attending: Cardiovascular Disease | Admitting: *Deleted

## 2023-04-17 DIAGNOSIS — I513 Intracardiac thrombosis, not elsewhere classified: Secondary | ICD-10-CM | POA: Diagnosis not present

## 2023-04-17 DIAGNOSIS — I2102 ST elevation (STEMI) myocardial infarction involving left anterior descending coronary artery: Secondary | ICD-10-CM

## 2023-04-17 DIAGNOSIS — Z5181 Encounter for therapeutic drug level monitoring: Secondary | ICD-10-CM

## 2023-04-17 LAB — POCT INR: INR: 3.6 — AB (ref 2.0–3.0)

## 2023-04-17 NOTE — Patient Instructions (Addendum)
Description   Do not take any warfarin today then continue taking 1 tablet daily except 1/2 tablet on Monday, Wednesday and Friday. Recheck INR in 4 weeks.  [facility] [phone] or [phone] Main [phone]

## 2023-04-21 ENCOUNTER — Other Ambulatory Visit: Payer: Self-pay | Admitting: Cardiology

## 2023-04-21 DIAGNOSIS — E7800 Pure hypercholesterolemia, unspecified: Secondary | ICD-10-CM

## 2023-05-04 ENCOUNTER — Other Ambulatory Visit: Payer: Self-pay | Admitting: Medical

## 2023-05-07 ENCOUNTER — Other Ambulatory Visit: Payer: Self-pay | Admitting: Cardiology

## 2023-05-15 ENCOUNTER — Ambulatory Visit: Payer: Medicare PPO | Attending: Cardiovascular Disease

## 2023-05-15 ENCOUNTER — Other Ambulatory Visit: Payer: Self-pay | Admitting: Cardiology

## 2023-05-15 DIAGNOSIS — I2102 ST elevation (STEMI) myocardial infarction involving left anterior descending coronary artery: Secondary | ICD-10-CM

## 2023-05-15 DIAGNOSIS — Z5181 Encounter for therapeutic drug level monitoring: Secondary | ICD-10-CM

## 2023-05-15 DIAGNOSIS — I513 Intracardiac thrombosis, not elsewhere classified: Secondary | ICD-10-CM | POA: Diagnosis not present

## 2023-05-15 LAB — POCT INR: INR: 2.2 (ref 2.0–3.0)

## 2023-05-15 NOTE — Patient Instructions (Signed)
 continue taking 1 tablet daily except 1/2 tablet on Monday, Wednesday and Friday. Recheck INR in 4 weeks.  [facility] [phone] or [phone] Main [phone]

## 2023-05-18 ENCOUNTER — Telehealth: Payer: Self-pay | Admitting: Cardiology

## 2023-05-18 ENCOUNTER — Encounter: Payer: Self-pay | Admitting: Pharmacist

## 2023-05-18 NOTE — Telephone Encounter (Signed)
I did not need this encounter. °

## 2023-05-22 ENCOUNTER — Telehealth: Payer: Self-pay

## 2023-05-22 NOTE — Telephone Encounter (Signed)
 [employer] alert received for 1 ICD shock 05/19/23 @ 16:37. VF detection zone met. Appears clean break.   Patient reports he was sitting down during ICD shock. Denies any symptoms such as chest pain/shortness of breath or other concerning symptoms. Compliance with Toprol- CL 200mg  daily. Denies symptoms of fluid retention/medication.  Shock plan/[place] DMV driving restrictions x6 months reviewed w/ patient w/ verbal understanding.  Routing to Dr. Ladona Ridgel for review.

## 2023-05-23 NOTE — Telephone Encounter (Signed)
 Normal device function.

## 2023-05-25 ENCOUNTER — Ambulatory Visit: Payer: Medicare PPO | Admitting: Cardiology

## 2023-05-26 ENCOUNTER — Ambulatory Visit (INDEPENDENT_AMBULATORY_CARE_PROVIDER_SITE_OTHER): Payer: Medicare HMO

## 2023-05-26 DIAGNOSIS — I255 Ischemic cardiomyopathy: Secondary | ICD-10-CM

## 2023-05-29 ENCOUNTER — Other Ambulatory Visit: Payer: Self-pay | Admitting: Internal Medicine

## 2023-05-29 DIAGNOSIS — I513 Intracardiac thrombosis, not elsewhere classified: Secondary | ICD-10-CM

## 2023-05-29 LAB — CUP PACEART REMOTE DEVICE CHECK
Battery Voltage: 86
Date Time Interrogation Session: 20250328085745
Implantable Lead Connection Status: 753985
Implantable Lead Implant Date: 20201210
Implantable Lead Location: 753860
Implantable Lead Model: 436909
Implantable Lead Serial Number: 8102423
Implantable Pulse Generator Implant Date: 20201210
Pulse Gen Model: 429525
Pulse Gen Serial Number: 84745362

## 2023-06-04 ENCOUNTER — Encounter: Payer: Self-pay | Admitting: Internal Medicine

## 2023-06-06 ENCOUNTER — Ambulatory Visit: Payer: No Typology Code available for payment source

## 2023-06-06 DIAGNOSIS — Z0000 Encounter for general adult medical examination without abnormal findings: Secondary | ICD-10-CM | POA: Diagnosis not present

## 2023-06-06 NOTE — Progress Notes (Signed)
 Subjective:   Caleb Thomas is a 63 y.o. who presents for a Medicare Wellness preventive visit.  Visit Complete: Virtual I connected with  Caleb Thomas on 06/06/23 by a audio enabled telemedicine application and verified that I am speaking with the correct person using two identifiers.  Patient Location: Home  Provider Location: Office/Clinic  I discussed the limitations of evaluation and management by telemedicine. The patient expressed understanding and agreed to proceed.  Vital Signs: Because this visit was a virtual/telehealth visit, some criteria may be missing or patient reported. Any vitals not documented were not able to be obtained and vitals that have been documented are patient reported.  VideoError- Librarian, academic were attempted between this provider and patient, however failed, due to patient having technical difficulties OR patient did not have access to video capability.  We continued and completed visit with audio only.   Persons Participating in Visit: Patient.  AWV Questionnaire: Yes: Patient Medicare AWV questionnaire was completed by the patient on 05/30/2023; I have confirmed that all information answered by patient is correct and no changes since this date.  Cardiac Risk Factors include: dyslipidemia;hypertension;male gender     Objective:    Today's Vitals   There is no height or weight on file to calculate BMI.     06/06/2023   10:12 AM 05/31/2022   10:16 AM 02/26/2021    6:59 AM 02/07/2019    8:19 AM 05/06/2018   11:00 PM  Advanced Directives  Does Patient Have a Medical Advance Directive? No Yes No No No  Type of Special educational needs teacher of [place];Living will     Copy of Healthcare Power of Attorney in Chart?  No - copy requested     Would patient like information on creating a medical advance directive? No - Patient declined   No - Patient declined No - Patient declined    Current Medications  (verified) Outpatient Encounter Medications as of 06/06/2023  Medication Sig   atorvastatin (LIPITOR) 80 MG tablet Take 1 tablet by mouth once daily   clopidogrel (PLAVIX) 75 MG tablet Take 1 tablet by mouth once daily   empagliflozin (JARDIANCE) 10 MG TABS tablet Take 1 tablet (10 mg total) by mouth daily before breakfast.   famotidine (PEPCID) 20 MG tablet TAKE 1 TABLET BY MOUTH ONCE OR TWICE DAILY FOR REFLUX   metoprolol (TOPROL-XL) 200 MG 24 hr tablet TAKE 1 TABLET BY MOUTH ONCE DAILY (STOPPING CARVEDILOL)   Multiple Vitamins-Minerals (CENTRUM ADULT PO) Take 1 tablet by mouth daily.   pantoprazole (PROTONIX) 40 MG tablet 1 tablet daily po 45 min prior to breakfast   sacubitril-valsartan (ENTRESTO) 97-103 MG Take 1 tablet by mouth 2 (two) times daily.   spironolactone (ALDACTONE) 25 MG tablet Take 1 tablet by mouth once daily   VASCEPA 1 g capsule Take 2 capsules by mouth twice daily   warfarin (COUMADIN) 5 MG tablet TAKE 1/2 TO 1 (ONE-HALF TO ONE) TABLET BY MOUTH ONCE DAILY AS DIRECTED BY THE ANTICOAGULATION CLINIC   No facility-administered encounter medications on file as of 06/06/2023.    Allergies (verified) Patient has no known allergies.   History: Past Medical History:  Diagnosis Date   CAD (coronary artery disease), native coronary artery 04/2018   s/p anterior STEMI with severe single-vessel CAD with occluded mid LAD and faint left-to-left collaterals.   Chronic systolic (congestive) heart failure (HCC)    Hyperlipidemia    Hypertension 2015   Ischemic cardiomyopathy  EF 25-30% by echo 01/2019 s/p AICD implant   LV (left ventricular) mural thrombus    noted on cardiac MRI but not on echo with contrast>>on chronic warfarin   Past Surgical History:  Procedure Laterality Date   COLONOSCOPY  2013   "normal" per patient, [place]   COLONOSCOPY WITH PROPOFOL N/A 02/26/2021   Procedure: COLONOSCOPY WITH PROPOFOL;  Surgeon: Jeani Hawking, MD;  Location: [facility] ENDOSCOPY;   Service: Endoscopy;  Laterality: N/A;   ESOPHAGOGASTRODUODENOSCOPY (EGD) WITH PROPOFOL N/A 02/26/2021   Procedure: ESOPHAGOGASTRODUODENOSCOPY (EGD) WITH PROPOFOL;  Surgeon: Jeani Hawking, MD;  Location: [facility] ENDOSCOPY;  Service: Endoscopy;  Laterality: N/A;   ICD IMPLANT N/A 02/07/2019   Procedure: ICD IMPLANT;  Surgeon: Marinus Maw, MD;  Location: [facility] INVASIVE CV LAB;  Service: Cardiovascular;  Laterality: N/A;   LEFT HEART CATH AND CORONARY ANGIOGRAPHY N/A 05/06/2018   Procedure: LEFT HEART CATH AND CORONARY ANGIOGRAPHY;  Surgeon: Yvonne Kendall, MD;  Location: [facility] INVASIVE CV LAB;  Service: Cardiovascular;  Laterality: N/A;   POLYPECTOMY  02/26/2021   Procedure: POLYPECTOMY;  Surgeon: Jeani Hawking, MD;  Location: [facility] ENDOSCOPY;  Service: Endoscopy;;   Family History  Problem Relation Age of Onset   Stroke Mother    Heart disease Father    Hypertension Brother    Stroke Brother    Hypertension Brother    Cancer Neg Hx    Social History   Socioeconomic History   Marital status: Married    Spouse name: Not on file   Number of children: Not on file   Years of education: Not on file   Highest education level: Not on file  Occupational History   Not on file  Tobacco Use   Smoking status: Former   Smokeless tobacco: Never   Tobacco comments:    quit smoking 63 years old ago  Vaping Use   Vaping status: Never Used  Substance and Sexual Activity   Alcohol use: Not Currently    Alcohol/week: 3.0 standard drinks of alcohol    Types: 3 Cans of beer per week    Comment: on weekend    Drug use: Not Currently   Sexual activity: Not on file  Other Topics Concern   Not on file  Social History Narrative   Married, 10 children, 4 grandchildren.  Former Engineer, civil (consulting) at [employer].  Walks for exercise.   Disabled   05/2022   Social Drivers of Health   Financial Resource Strain: Low Risk  (06/06/2023)   Overall Financial Resource Strain (CARDIA)    Difficulty of Paying Living  Expenses: Not hard at all  Food Insecurity: No Food Insecurity (06/06/2023)   Hunger Vital Sign    Worried About Running Out of Food in the Last Year: Never true    Ran Out of Food in the Last Year: Never true  Transportation Needs: No Transportation Needs (06/06/2023)   PRAPARE - Administrator, Civil Service (Medical): No    Lack of Transportation (Non-Medical): No  Physical Activity: Sufficiently Active (06/06/2023)   Exercise Vital Sign    Days of Exercise per Week: 7 days    Minutes of Exercise per Session: 40 min  Stress: No Stress Concern Present (06/06/2023)   [employer] of Occupational Health - Occupational Stress Questionnaire    Feeling of Stress : Not at all  Social Connections: Moderately Integrated (06/06/2023)   Social Connection and Isolation Panel [NHANES]    Frequency of Communication with Friends and Family: Three times  a week    Frequency of Social Gatherings with Friends and Family: Not on file    Attends Religious Services: More than 4 times per year    Active Member of [employer] or Organizations: No    Attends Banker Meetings: Never    Marital Status: Married    Tobacco Counseling Counseling given: Not Answered Tobacco comments: quit smoking 63 years old ago    Clinical Intake:  Pre-visit preparation completed: Yes  Pain : No/denies pain     Nutritional Risks: Nausea/ vomitting/ diarrhea (diarrhea a week ago, resolved) Diabetes: No  Lab Results  Component Value Date   HGBA1C 6.3 (H) 06/21/2022   HGBA1C 6.0 (H) 06/03/2021   HGBA1C 5.9 (A) 11/24/2020     How often do you need to have someone help you when you read instructions, pamphlets, or other written materials from your doctor or pharmacy?: 1 - Never  Interpreter Needed?: No  Information entered by :: NAllen LPN   Activities of Daily Living     05/30/2023   10:20 AM  In your present state of health, do you have any difficulty performing the following activities:   Hearing? 0  Vision? 0  Difficulty concentrating or making decisions? 0  Walking or climbing stairs? 0  Dressing or bathing? 0  Doing errands, shopping? 0  Preparing Food and eating ? N  Using the Toilet? N  In the past six months, have you accidently leaked urine? Y  Do you have problems with loss of bowel control? N  Managing your Medications? N  Managing your Finances? N  Housekeeping or managing your Housekeeping? N    Patient Care Team: Tysinger, Kermit Balo, PA-C as PCP - General (Family Medicine) Quintella Reichert, MD as PCP - Cardiology (Cardiology) Marinus Maw, MD as PCP - Electrophysiology (Cardiology)  Indicate any recent Medical Services you may have received from other than [facility] providers in the past year (date may be approximate).     Assessment:   This is a routine wellness examination for Oseias.  Hearing/Vision screen Hearing Screening - Comments:: Denies hearing issues Vision Screening - Comments:: Regular eye exams, [facility]   Goals Addressed             This Visit's Progress    Patient Stated       06/06/2023, stay healthy       Depression Screen     06/06/2023   10:13 AM 05/31/2022   10:17 AM 05/26/2021    2:58 PM 11/24/2020   11:45 AM 03/09/2018   11:40 AM 12/01/2016   10:59 AM  PHQ 2/9 Scores  PHQ - 2 Score 0 0 0 0 0 0  PHQ- 9 Score 0 2        Fall Risk     05/30/2023   10:20 AM 05/31/2022   10:17 AM 05/26/2021    2:58 PM 11/24/2020   11:45 AM 03/09/2018   11:40 AM  Fall Risk   Falls in the past year? 0 0 0 0 0  Number falls in past yr: 0 0 0 0   Injury with Fall? 0 0 0 0   Risk for fall due to : Medication side effect Medication side effect No Fall Risks No Fall Risks   Follow up Falls prevention discussed;Falls evaluation completed Falls prevention discussed;Education provided;Falls evaluation completed Falls evaluation completed Falls evaluation completed     MEDICARE RISK AT HOME:  Medicare Risk at Home Any stairs in or around  the  home?: (Patient-Rptd) Yes If so, are there any without handrails?: (Patient-Rptd) No Home free of loose throw rugs in walkways, pet beds, electrical cords, etc?: (Patient-Rptd) Yes Adequate lighting in your home to reduce risk of falls?: (Patient-Rptd) No Life alert?: (Patient-Rptd) No Use of a cane, walker or w/c?: (Patient-Rptd) No Grab bars in the bathroom?: (Patient-Rptd) No Shower chair or bench in shower?: (Patient-Rptd) No Elevated toilet seat or a handicapped toilet?: (Patient-Rptd) No  TIMED UP AND GO:  Was the test performed?  No  Cognitive Function: 6CIT completed        06/06/2023   10:13 AM 05/31/2022   10:19 AM  6CIT Screen  What Year? 0 points 0 points  What month? 0 points 0 points  What time? 0 points 0 points  Count back from 20 0 points 0 points  Months in reverse 0 points 0 points  Repeat phrase 0 points 0 points  Total Score 0 points 0 points    Immunizations Immunization History  Administered Date(s) Administered   Moderna Sars-Covid-2 Vaccination 07/15/2019, 08/12/2019, 03/04/2020   PNEUMOCOCCAL CONJUGATE-20 05/26/2021   Tdap 07/20/2017   Zoster Recombinant(Shingrix) 06/21/2021, 09/01/2021    Screening Tests Health Maintenance  Topic Date Due   COVID-19 Vaccine (4 - 2024-25 season) 10/30/2022   INFLUENZA VACCINE  11/24/2024 (Originally 09/29/2023)   Medicare Annual Wellness (AWV)  06/05/2024   DTaP/Tdap/Td (2 - Td or Tdap) 07/21/2027   Colonoscopy  02/27/2031   Pneumonia Vaccine 53+ Years old  Completed   Hepatitis C Screening  Completed   HIV Screening  Completed   Zoster Vaccines- Shingrix  Completed   HPV VACCINES  Aged Out    Health Maintenance  Health Maintenance Due  Topic Date Due   COVID-19 Vaccine (4 - 2024-25 season) 10/30/2022   Health Maintenance Items Addressed: Due for covid vaccine  Additional Screening:  Vision Screening: Recommended annual ophthalmology exams for early detection of glaucoma and other disorders of the  eye.  Dental Screening: Recommended annual dental exams for proper oral hygiene  Community Resource Referral / Chronic Care Management: CRR required this visit?  No   CCM required this visit?  No     Plan:     I have personally reviewed and noted the following in the patient's chart:   Medical and social history Use of alcohol, tobacco or illicit drugs  Current medications and supplements including opioid prescriptions. Patient is not currently taking opioid prescriptions. Functional ability and status Nutritional status Physical activity Advanced directives List of other physicians Hospitalizations, surgeries, and ER visits in previous 12 months Vitals Screenings to include cognitive, depression, and falls Referrals and appointments  In addition, I have reviewed and discussed with patient certain preventive protocols, quality metrics, and best practice recommendations. A written personalized care plan for preventive services as well as general preventive health recommendations were provided to patient.     Barb Merino, LPN   11/03/1882   After Visit Summary: (MyChart) Due to this being a telephonic visit, the after visit summary with patients personalized plan was offered to patient via MyChart   Notes: Nothing significant to report at this time.

## 2023-06-06 NOTE — Patient Instructions (Signed)
 Mr. Sturges , Thank you for taking time to come for your Medicare Wellness Visit. I appreciate your ongoing commitment to your health goals. Please review the following plan we discussed and let me know if I can assist you in the future.   Referrals/Orders/Follow-Ups/Clinician Recommendations: none  This is a list of the screening recommended for you and due dates:  Health Maintenance  Topic Date Due   COVID-19 Vaccine (4 - 2024-25 season) 10/30/2022   Flu Shot  11/24/2024*   Medicare Annual Wellness Visit  06/05/2024   DTaP/Tdap/Td vaccine (2 - Td or Tdap) 07/21/2027   Colon Cancer Screening  02/27/2031   Pneumonia Vaccine  Completed   Hepatitis C Screening  Completed   HIV Screening  Completed   Zoster (Shingles) Vaccine  Completed   HPV Vaccine  Aged Out  *Topic was postponed. The date shown is not the original due date.    Advanced directives: (Declined) Advance directive discussed with you today. Even though you declined this today, please call our office should you change your mind, and we can give you the proper paperwork for you to fill out.  Next Medicare Annual Wellness Visit scheduled for next year: Yes.  insert Preventive Care attachment Insert FALL PREVENTION attachment if needed

## 2023-06-08 ENCOUNTER — Other Ambulatory Visit: Payer: Self-pay | Admitting: Medical

## 2023-06-12 ENCOUNTER — Ambulatory Visit: Attending: Cardiovascular Disease | Admitting: *Deleted

## 2023-06-12 DIAGNOSIS — Z7901 Long term (current) use of anticoagulants: Secondary | ICD-10-CM

## 2023-06-12 DIAGNOSIS — Z5181 Encounter for therapeutic drug level monitoring: Secondary | ICD-10-CM

## 2023-06-12 DIAGNOSIS — I2102 ST elevation (STEMI) myocardial infarction involving left anterior descending coronary artery: Secondary | ICD-10-CM | POA: Diagnosis not present

## 2023-06-12 DIAGNOSIS — I513 Intracardiac thrombosis, not elsewhere classified: Secondary | ICD-10-CM | POA: Diagnosis not present

## 2023-06-12 LAB — POCT INR: INR: 2.8 (ref 2.0–3.0)

## 2023-06-12 NOTE — Patient Instructions (Signed)
 Description   Continue taking warfarin 1 tablet daily except 1/2 tablet on Monday, Wednesday and Friday. Recheck INR in 5 weeks.  Coumadin Clinic [phone] or [phone] Main [phone]

## 2023-06-22 ENCOUNTER — Encounter: Payer: Self-pay | Admitting: Medical

## 2023-06-22 ENCOUNTER — Ambulatory Visit: Payer: No Typology Code available for payment source | Admitting: Medical

## 2023-06-22 VITALS — BP 130/80 | HR 74 | Ht 69.0 in | Wt 205.6 lb

## 2023-06-22 DIAGNOSIS — Z1211 Encounter for screening for malignant neoplasm of colon: Secondary | ICD-10-CM

## 2023-06-22 DIAGNOSIS — I2510 Atherosclerotic heart disease of native coronary artery without angina pectoris: Secondary | ICD-10-CM

## 2023-06-22 DIAGNOSIS — I5022 Chronic systolic (congestive) heart failure: Secondary | ICD-10-CM

## 2023-06-22 DIAGNOSIS — Z125 Encounter for screening for malignant neoplasm of prostate: Secondary | ICD-10-CM | POA: Diagnosis not present

## 2023-06-22 DIAGNOSIS — Z7902 Long term (current) use of antithrombotics/antiplatelets: Secondary | ICD-10-CM | POA: Insufficient documentation

## 2023-06-22 DIAGNOSIS — K08109 Complete loss of teeth, unspecified cause, unspecified class: Secondary | ICD-10-CM

## 2023-06-22 DIAGNOSIS — I10 Essential (primary) hypertension: Secondary | ICD-10-CM

## 2023-06-22 DIAGNOSIS — Z0000 Encounter for general adult medical examination without abnormal findings: Secondary | ICD-10-CM | POA: Diagnosis not present

## 2023-06-22 DIAGNOSIS — I255 Ischemic cardiomyopathy: Secondary | ICD-10-CM | POA: Diagnosis not present

## 2023-06-22 DIAGNOSIS — Z95810 Presence of automatic (implantable) cardiac defibrillator: Secondary | ICD-10-CM

## 2023-06-22 DIAGNOSIS — E7800 Pure hypercholesterolemia, unspecified: Secondary | ICD-10-CM

## 2023-06-22 DIAGNOSIS — Z7901 Long term (current) use of anticoagulants: Secondary | ICD-10-CM | POA: Insufficient documentation

## 2023-06-22 DIAGNOSIS — Z7189 Other specified counseling: Secondary | ICD-10-CM | POA: Insufficient documentation

## 2023-06-22 DIAGNOSIS — R7301 Impaired fasting glucose: Secondary | ICD-10-CM | POA: Diagnosis not present

## 2023-06-22 DIAGNOSIS — K219 Gastro-esophageal reflux disease without esophagitis: Secondary | ICD-10-CM

## 2023-06-22 LAB — URINALYSIS, ROUTINE W REFLEX MICROSCOPIC
Bilirubin, UA: NEGATIVE
Ketones, UA: NEGATIVE
Leukocytes,UA: NEGATIVE
Nitrite, UA: NEGATIVE
RBC, UA: NEGATIVE
Specific Gravity, UA: 1.027 (ref 1.005–1.030)
Urobilinogen, Ur: 1 mg/dL (ref 0.2–1.0)
pH, UA: 6 (ref 5.0–7.5)

## 2023-06-22 LAB — MICROSCOPIC EXAMINATION
Bacteria, UA: NONE SEEN
Casts: NONE SEEN /LPF
Epithelial Cells (non renal): NONE SEEN /HPF (ref 0–10)
RBC, Urine: NONE SEEN /HPF (ref 0–2)
WBC, UA: NONE SEEN /HPF (ref 0–5)

## 2023-06-22 LAB — LIPID PANEL

## 2023-06-22 NOTE — Progress Notes (Signed)
 Subjective:   HPI  Caleb Thomas is a 63 y.o. male who presents for Chief Complaint  Patient presents with   Annual Exam    Fasting cpe, no concerns. Been on Medicare for a couple years. AWV done on 06/06/23 with Letitia Ravens    Patient Care Team: Claudene Crystal, PA-C as PCP - General (Family Medicine) Jacqueline Matsu, MD as PCP - Cardiology (Cardiology) Tammie Fall, MD as PCP - Electrophysiology (Cardiology) Alvis Jourdain, MD as Consulting Physician (Gastroenterology)   Concerns: Doing okay.  No particular concerns.  Compliant with medications.    Reviewed their medical, surgical, family, social, medication, and allergy history and updated chart as appropriate.  No Known Allergies  Past Medical History:  Diagnosis Date   CAD (coronary artery disease), native coronary artery 04/2018   s/p anterior STEMI with severe single-vessel CAD with occluded mid LAD and faint left-to-left collaterals.   Chronic systolic (congestive) heart failure (HCC)    Hyperlipidemia    Hypertension 2015   Ischemic cardiomyopathy    EF 25-30% by echo 01/2019 s/p AICD implant   LV (left ventricular) mural thrombus    noted on cardiac MRI but not on echo with contrast>>on chronic warfarin    Current Outpatient Medications on File Prior to Visit  Medication Sig Dispense Refill   atorvastatin  (LIPITOR ) 80 MG tablet Take 1 tablet by mouth once daily 90 tablet 1   clopidogrel  (PLAVIX ) 75 MG tablet Take 1 tablet by mouth once daily 90 tablet 1   empagliflozin  (JARDIANCE ) 10 MG TABS tablet Take 1 tablet (10 mg total) by mouth daily before breakfast. 90 tablet 3   famotidine  (PEPCID ) 20 MG tablet TAKE 1 TABLET BY MOUTH ONCE OR TWICE DAILY FOR REFLUX 180 tablet 0   metoprolol  (TOPROL -XL) 200 MG 24 hr tablet TAKE 1 TABLET BY MOUTH ONCE DAILY (STOPPING CARVEDILOL ) 90 tablet 2   Multiple Vitamins-Minerals (CENTRUM ADULT PO) Take 1 tablet by mouth daily.     pantoprazole  (PROTONIX ) 40 MG tablet TAKE 1 TABLET BY  MOUTH ONCE DAILY 45  MINUTES  PRIOR  TO  BREAKFAST 90 tablet 0   sacubitril-valsartan (ENTRESTO ) 97-103 MG Take 1 tablet by mouth 2 (two) times daily. 180 tablet 3   spironolactone  (ALDACTONE ) 25 MG tablet Take 1 tablet by mouth once daily 90 tablet 2   VASCEPA  1 g capsule Take 2 capsules by mouth twice daily 360 capsule 2   warfarin (COUMADIN ) 5 MG tablet TAKE 1/2 TO 1 (ONE-HALF TO ONE) TABLET BY MOUTH ONCE DAILY AS DIRECTED BY THE ANTICOAGULATION CLINIC 35 tablet 0   No current facility-administered medications on file prior to visit.      Current Outpatient Medications:    atorvastatin  (LIPITOR ) 80 MG tablet, Take 1 tablet by mouth once daily, Disp: 90 tablet, Rfl: 1   clopidogrel  (PLAVIX ) 75 MG tablet, Take 1 tablet by mouth once daily, Disp: 90 tablet, Rfl: 1   empagliflozin  (JARDIANCE ) 10 MG TABS tablet, Take 1 tablet (10 mg total) by mouth daily before breakfast., Disp: 90 tablet, Rfl: 3   famotidine  (PEPCID ) 20 MG tablet, TAKE 1 TABLET BY MOUTH ONCE OR TWICE DAILY FOR REFLUX, Disp: 180 tablet, Rfl: 0   metoprolol  (TOPROL -XL) 200 MG 24 hr tablet, TAKE 1 TABLET BY MOUTH ONCE DAILY (STOPPING CARVEDILOL ), Disp: 90 tablet, Rfl: 2   Multiple Vitamins-Minerals (CENTRUM ADULT PO), Take 1 tablet by mouth daily., Disp: , Rfl:    pantoprazole  (PROTONIX ) 40 MG tablet, TAKE 1 TABLET BY  MOUTH ONCE DAILY 45  MINUTES  PRIOR  TO  BREAKFAST, Disp: 90 tablet, Rfl: 0   sacubitril-valsartan (ENTRESTO ) 97-103 MG, Take 1 tablet by mouth 2 (two) times daily., Disp: 180 tablet, Rfl: 3   spironolactone  (ALDACTONE ) 25 MG tablet, Take 1 tablet by mouth once daily, Disp: 90 tablet, Rfl: 2   VASCEPA  1 g capsule, Take 2 capsules by mouth twice daily, Disp: 360 capsule, Rfl: 2   warfarin (COUMADIN ) 5 MG tablet, TAKE 1/2 TO 1 (ONE-HALF TO ONE) TABLET BY MOUTH ONCE DAILY AS DIRECTED BY THE ANTICOAGULATION CLINIC, Disp: 35 tablet, Rfl: 0  Family History  Problem Relation Age of Onset   Stroke Mother    Heart disease  Father    Hypertension Brother    Stroke Brother    Hypertension Brother    Cancer Neg Hx     Past Surgical History:  Procedure Laterality Date   COLONOSCOPY  2013   "normal" per patient, [place]   COLONOSCOPY WITH PROPOFOL  N/A 02/26/2021   Procedure: COLONOSCOPY WITH PROPOFOL ;  Surgeon: Alvis Jourdain, MD;  Location: [facility];  Service: Endoscopy;  Laterality: N/A;   ESOPHAGOGASTRODUODENOSCOPY (EGD) WITH PROPOFOL  N/A 02/26/2021   Procedure: ESOPHAGOGASTRODUODENOSCOPY (EGD) WITH PROPOFOL ;  Surgeon: Alvis Jourdain, MD;  Location: [facility];  Service: Endoscopy;  Laterality: N/A;   ICD IMPLANT N/A 02/07/2019   Procedure: ICD IMPLANT;  Surgeon: Tammie Fall, MD;  Location: [facility] INVASIVE CV LAB;  Service: Cardiovascular;  Laterality: N/A;   LEFT HEART CATH AND CORONARY ANGIOGRAPHY N/A 05/06/2018   Procedure: LEFT HEART CATH AND CORONARY ANGIOGRAPHY;  Surgeon: Sammy Crisp, MD;  Location: [facility] INVASIVE CV LAB;  Service: Cardiovascular;  Laterality: N/A;   POLYPECTOMY  02/26/2021   Procedure: POLYPECTOMY;  Surgeon: Alvis Jourdain, MD;  Location: [facility];  Service: Endoscopy;;     Review of Systems  Constitutional:  Negative for chills, fever, malaise/fatigue and weight loss.  HENT:  Negative for congestion, ear pain, hearing loss, sore throat and tinnitus.   Eyes:  Negative for blurred vision, pain and redness.  Respiratory:  Negative for cough, hemoptysis and shortness of breath.   Cardiovascular:  Negative for chest pain, palpitations, orthopnea, claudication and leg swelling.  Gastrointestinal:  Negative for abdominal pain, blood in stool, constipation, diarrhea, nausea and vomiting.  Genitourinary:  Negative for dysuria, flank pain, frequency, hematuria and urgency.  Musculoskeletal:  Negative for falls, joint pain and myalgias.  Skin:  Negative for itching and rash.  Neurological:  Negative for dizziness, tingling, speech change, weakness and headaches.   Endo/Heme/Allergies:  Negative for polydipsia. Does not bruise/bleed easily.  Psychiatric/Behavioral:  Negative for depression and memory loss. The patient is not nervous/anxious and does not have insomnia.         Objective:  BP 130/80   Pulse 74   Ht 5\' 9"  (1.753 m)   Wt 205 lb 9.6 oz (93.3 kg)   BMI 30.36 kg/m   General appearance: alert, no distress, WD/WN, African American male Skin: Scattered skin tags anterior neck, no worrisome lesions HEENT: normocephalic, conjunctiva/corneas normal, sclerae anicteric, PERRLA, EOMi, nares patent, no discharge or erythema, pharynx normal Oral cavity: MMM, tongue normal, teeth -upper and lower dentures present Neck: supple, no lymphadenopathy, no thyromegaly, no masses, normal ROM, no bruits Chest: non tender, normal shape and expansion Heart: RRR, normal S1, S2, no murmurs Lungs: CTA bilaterally, no wheezes, rhonchi, or rales Abdomen: +bs, soft, non tender, non distended, no masses, no hepatomegaly, no splenomegaly, no bruits Back:  non tender, normal ROM, no scoliosis Musculoskeletal: upper extremities non tender, no obvious deformity, normal ROM throughout, lower extremities non tender, no obvious deformity, normal ROM throughout Extremities: no edema, no cyanosis, no clubbing Pulses: 2+ symmetric, upper and lower extremities, normal cap refill Neurological: alert, oriented x 3, CN2-12 intact, strength normal upper extremities and lower extremities, sensation normal throughout, DTRs 2+ throughout, no cerebellar signs, gait normal Psychiatric: normal affect, behavior normal, pleasant  GU/rectal-deferred/declined    Assessment and Plan :   Encounter Diagnoses  Name Primary?   Encounter for health maintenance examination in adult Yes   Coronary artery disease involving native coronary artery of native heart without angina pectoris    Chronic systolic (congestive) heart failure (HCC)    Screening for prostate cancer    Screen for  colon cancer    Ischemic cardiomyopathy    Impaired fasting glucose    ICD (implantable cardioverter-defibrillator) in place    Pure hypercholesterolemia    Essential hypertension    Full dentures    Advance directive discussed with patient    Current use of long term anticoagulation     This visit was a preventative care visit, also known as wellness visit or routine physical.   Topics typically include healthy lifestyle, diet, exercise, preventative care, vaccinations, sick and well care, proper use of emergency dept and after hours care, as well as other concerns.     Separate significant issues discussed: Coronary artery disease, chronic systolic heart failure, cardiomyopathy-continue current medications which are primarily managed by cardiology.  He continues on Plavix  75 mg daily, Jardiance  10 mg daily, Toprol -XL 200 mg daily, Entresto  97/103 mg twice daily, spironolactone  25 mg daily  Hyperlipidemia, CAD-continue atorvastatin  80 mg daily, Vascepa   GERD-continue Protonix  daily morning, Pepcid  at night  Long-term anticoagulation, on Coumadin  per cardiology and Coumadin  clinic  Impaired fasting glucose-counseled on diet and exercise.  Updated labs today   General Recommendations: Continue to return yearly for your annual wellness and preventative care visits.  This gives us  a chance to discuss healthy lifestyle, exercise, vaccinations, review your chart record, and perform screenings where appropriate.  I recommend you see your eye doctor yearly for routine vision care.   Vaccination  Immunization History  Administered Date(s) Administered   Moderna Sars-Covid-2 Vaccination 07/15/2019, 08/12/2019, 03/04/2020   PNEUMOCOCCAL CONJUGATE-20 05/26/2021   Tdap 07/20/2017   Zoster Recombinant(Shingrix) 06/21/2021, 09/01/2021     Screening for cancer: Colon cancer screening: Prior or last colon cancer screen: 01/2021, due repeat 01/2024   Prostate Cancer screening: The  recommended prostate cancer screening test is a blood test called the prostate-specific antigen (PSA) test. PSA is a protein that is made in the prostate. As you age, your prostate naturally produces more PSA. Abnormally high PSA levels may be caused by: Prostate cancer. An enlarged prostate that is not caused by cancer (benign prostatic hyperplasia, or BPH). This condition is very common in older men. A prostate gland infection (prostatitis) or urinary tract infection. Certain medicines such as male hormones (like testosterone ) or other medicines that raise testosterone  levels. A rectal exam may be done as part of prostate cancer screening to help provide information about the size of your prostate gland. When a rectal exam is performed, it should be done after the PSA level is drawn to avoid any effect on the results.   Skin cancer screening: Check your skin regularly for new changes, growing lesions, or other lesions of concern Come in for evaluation if you have skin  lesions of concern.   Lung cancer screening: If you have a greater than 20 pack year history of tobacco use, then you may qualify for lung cancer screening with a chest CT scan.   Please call your insurance company to inquire about coverage for this test.   Pancreatic cancer:  no current screening test is available or routinely recommended. (risk factors: smoking, overweight or obese, diabetes, chronic pancreatitis, work exposure - dry cleaning, metal working, 65yo>, M>F, Tree surgeon, family hx/o, hereditary breast, ovarian, melanoma, lynch, peutz-jeghers).  Symptoms: jaundice, dark urine, light color or greasy stools, itchy skin, belly or back pain, weight loss, poor appetite, nause, vomiting, liver enlargement, DVT/blood clots.   We currently don't have screenings for other cancers besides breast, cervical, colon, and lung cancers.  If you have a strong family history of cancer or have other cancer screening concerns,  please let me know.  Genetic testing referral is an option for individuals with high cancer risk in the family.  There are some other cancer screenings in development currently.   Bone health: Get at least 150 minutes of aerobic exercise weekly Get weight bearing exercise at least once weekly Bone density test:  A bone density test is an imaging test that uses a type of X-ray to measure the amount of calcium  and other minerals in your bones. The test may be used to diagnose or screen you for a condition that causes weak or thin bones (osteoporosis), predict your risk for a broken bone (fracture), or determine how well your osteoporosis treatment is working. The bone density test is recommended for females 65 and older, or females or males <65 if certain risk factors such as thyroid  disease, long term use of steroids such as for asthma or rheumatological issues, vitamin D deficiency, estrogen deficiency, family history of osteoporosis, self or family history of fragility fracture in first degree relative.    Heart health: Get at least 150 minutes of aerobic exercise weekly Limit alcohol It is important to maintain a healthy blood pressure and healthy cholesterol numbers  Heart disease screening: Screening for heart disease includes screening for blood pressure, fasting lipids, glucose/diabetes screening, BMI height to weight ratio, reviewed of smoking status, physical activity, and diet.    Goals include blood pressure 120/80 or less, maintaining a healthy lipid/cholesterol profile, preventing diabetes or keeping diabetes numbers under good control, not smoking or using tobacco products, exercising most days per week or at least 150 minutes per week of exercise, and eating healthy variety of fruits and vegetables, healthy oils, and avoiding unhealthy food choices like fried food, fast food, high sugar and high cholesterol foods.     Vascular disease screening: For higher risk individuals  including smokers, diabetics, patients with known heart disease or high blood pressure, kidney disease, and others, screening for vascular disease or atherosclerosis of the arteries is available.  Examples may include carotid ultrasound, abdominal aortic ultrasound, ABI blood flow screening in the legs, thoracic aorta screening.    Medical care options: I recommend you continue to seek care here first for routine care.  We try really hard to have available appointments Monday through Friday daytime hours for sick visits, acute visits, and physicals.  Urgent care should be used for after hours and weekends for significant issues that cannot wait till the next day.  The emergency department should be used for significant potentially life-threatening emergencies.  The emergency department is expensive, can often have long wait times for less significant concerns, so  try to utilize primary care, urgent care, or telemedicine when possible to avoid unnecessary trips to the emergency department.  Virtual visits and telemedicine have been introduced since the pandemic started in 2020, and can be convenient ways to receive medical care.  We offer virtual appointments as well to assist you in a variety of options to seek medical care.   Legal  Take the time to do a last will and testament, Advanced Directives including Health Care Power of Attorney and Living Will documents.  Don't leave your family with burdens that can be handled ahead of time.   Advanced Directives: I recommend you consider completing a Health Care Power of Attorney and Living Will.   These documents respect your wishes and help alleviate burdens on your loved ones if you were to become terminally ill or be in a position to need those documents enforced.    You can complete Advanced Directives yourself, have them notarized, then have copies made for our office, for you and for anybody you feel should have them in safe keeping.  Or, you can  have an attorney prepare these documents.   If you haven't updated your Last Will and Testament in a while, it may be worthwhile having an attorney prepare these documents together and save on some costs.       Spiritual and Emotional Health Keeping a healthy spiritual life can help you better manage your physical health. Your spiritual life can help you to cope with any issues that may arise with your physical health.  Balance can keep us  healthy and help us  to recover.  If you are struggling with your spiritual health there are questions that you may want to ask yourself:  What makes me feel most complete? When do I feel most connected to the rest of the world? Where do I find the most inner strength? What am I doing when I feel whole?  Helpful tips: Being in nature. Some people feel very connected and at peace when they are walking outdoors or are outside. Helping others. Some feel the largest sense of wellbeing when they are of service to others. Being of service can take on many forms. It can be doing volunteer work, being kind to strangers, or offering a hand to a friend in need. Gratitude. Some people find they feel the most connected when they remain grateful. They may make lists of all the things they are grateful for or say a thank you out loud for all they have.    Emotional Health Are you in tune with your emotional health?  Check out this link: http://www.marquez-love.com/    Financial Health Make sure you use a budget for your personal finances Make sure you are insured against risks (health insurance, life insurance, auto insurance, etc) Save more, spend less Set financial goals If you need help in this area, good resources include counseling through [employer] or other community resources, have a meeting with a Social research officer, government, and a good resource is the [employer]    Amarri was seen today for annual exam.  Diagnoses and all orders  for this visit:  Encounter for health maintenance examination in adult -     CBC with Differential/Platelet -     Comprehensive metabolic panel with GFR -     Lipid panel -     Hemoglobin A1c -     PSA -     Urinalysis, Routine w reflex microscopic  Coronary artery disease  involving native coronary artery of native heart without angina pectoris  Chronic systolic (congestive) heart failure (HCC)  Screening for prostate cancer -     PSA  Screen for colon cancer  Ischemic cardiomyopathy  Impaired fasting glucose -     Hemoglobin A1c  ICD (implantable cardioverter-defibrillator) in place  Pure hypercholesterolemia -     Lipid panel  Essential hypertension  Full dentures  Advance directive discussed with patient  Current use of long term anticoagulation     Follow-up pending labs, yearly for physical

## 2023-06-23 ENCOUNTER — Other Ambulatory Visit: Payer: Self-pay | Admitting: Medical

## 2023-06-23 DIAGNOSIS — R718 Other abnormality of red blood cells: Secondary | ICD-10-CM

## 2023-06-23 DIAGNOSIS — D72829 Elevated white blood cell count, unspecified: Secondary | ICD-10-CM

## 2023-06-23 LAB — COMPREHENSIVE METABOLIC PANEL WITH GFR
ALT: 42 IU/L (ref 0–44)
AST: 31 IU/L (ref 0–40)
Albumin: 3.9 g/dL (ref 3.9–4.9)
Alkaline Phosphatase: 72 IU/L (ref 44–121)
BUN/Creatinine Ratio: 16 (ref 10–24)
BUN: 20 mg/dL (ref 8–27)
Bilirubin Total: 0.8 mg/dL (ref 0.0–1.2)
CO2: 20 mmol/L (ref 20–29)
Calcium: 9 mg/dL (ref 8.6–10.2)
Chloride: 108 mmol/L — ABNORMAL HIGH (ref 96–106)
Creatinine, Ser: 1.24 mg/dL (ref 0.76–1.27)
Globulin, Total: 3.3 g/dL (ref 1.5–4.5)
Glucose: 97 mg/dL (ref 70–99)
Potassium: 4.1 mmol/L (ref 3.5–5.2)
Sodium: 142 mmol/L (ref 134–144)
Total Protein: 7.2 g/dL (ref 6.0–8.5)
eGFR: 65 mL/min/{1.73_m2} (ref >59)

## 2023-06-23 LAB — PSA: Prostate Specific Ag, Serum: 1 ng/mL (ref 0.0–4.0)

## 2023-06-23 LAB — HEMOGLOBIN A1C
Est. average glucose Bld gHb Est-mCnc: 126 mg/dL
Hgb A1c MFr Bld: 6 % — ABNORMAL HIGH (ref 4.8–5.6)

## 2023-06-23 LAB — CBC WITH DIFFERENTIAL/PLATELET
Basophils Absolute: 0 10*3/uL (ref 0.0–0.2)
Basos: 1 %
EOS (ABSOLUTE): 0.2 10*3/uL (ref 0.0–0.4)
Eos: 4 %
Hematocrit: 55.2 % — ABNORMAL HIGH (ref 37.5–51.0)
Hemoglobin: 18.9 g/dL — ABNORMAL HIGH (ref 13.0–17.7)
Immature Grans (Abs): 0 10*3/uL (ref 0.0–0.1)
Immature Granulocytes: 0 %
Lymphocytes Absolute: 2.4 10*3/uL (ref 0.7–3.1)
Lymphs: 45 %
MCH: 28.8 pg (ref 26.6–33.0)
MCHC: 34.2 g/dL (ref 31.5–35.7)
MCV: 84 fL (ref 79–97)
Monocytes Absolute: 0.6 10*3/uL (ref 0.1–0.9)
Monocytes: 11 %
Neutrophils Absolute: 2.1 10*3/uL (ref 1.4–7.0)
Neutrophils: 39 %
Platelets: 236 10*3/uL (ref 150–450)
RBC: 6.56 x10E6/uL — ABNORMAL HIGH (ref 4.14–5.80)
RDW: 15.9 % — ABNORMAL HIGH (ref 11.6–15.4)
WBC: 5.3 10*3/uL (ref 3.4–10.8)

## 2023-06-23 LAB — LIPID PANEL
Cholesterol, Total: 108 mg/dL (ref 100–199)
HDL: 27 mg/dL — ABNORMAL LOW (ref >39)
LDL CALC COMMENT:: 4 ratio (ref 0.0–5.0)
LDL Chol Calc (NIH): 56 mg/dL (ref 0–99)
Triglycerides: 143 mg/dL (ref 0–149)
VLDL Cholesterol Cal: 25 mg/dL (ref 5–40)

## 2023-06-23 NOTE — Progress Notes (Signed)
 Hemoglobin is chronically elevated for the past year as well as red cells, liver kidney & electrolytes okay, HDL is chronically low but the bad cholesterol is okay.  Diabetes marker stable at 6.8%.  PSA prostate marker normal.  Continue your current medications.  Return at your convenience for additional labs related to the elevated red cells.  I also put an order for a chest x-ray regarding elevated red cells to try to help rule out causes of this.  Please go to [facility] for your chest xray.   Their hours are 8am - 4:30 pm Monday - Friday.  Take your insurance card with you.  [facility] [phone]   [address] [place], [place] [address]

## 2023-06-26 ENCOUNTER — Ambulatory Visit
Admission: RE | Admit: 2023-06-26 | Discharge: 2023-06-26 | Disposition: A | Discharge status: Home / Self Care | Source: Ambulatory Visit | Attending: Medical | Admitting: Medical

## 2023-06-26 DIAGNOSIS — R718 Other abnormality of red blood cells: Secondary | ICD-10-CM

## 2023-06-29 ENCOUNTER — Other Ambulatory Visit: Payer: Self-pay | Admitting: Cardiology

## 2023-06-29 DIAGNOSIS — I513 Intracardiac thrombosis, not elsewhere classified: Secondary | ICD-10-CM

## 2023-06-29 NOTE — Telephone Encounter (Signed)
 Prescription refill request received for warfarin Lov: 12/15/22 Caleb Thomas)  Next INR check: 07/17/23 Warfarin tablet strength: 5mg   Appropriate dose. Refill sent.

## 2023-06-30 NOTE — Progress Notes (Signed)
 Results sent through MyChart

## 2023-07-05 NOTE — Addendum Note (Signed)
 Addended by: Lott Rouleau A on: 07/05/2023 09:00 AM   Modules accepted: Orders

## 2023-07-05 NOTE — Progress Notes (Signed)
 Remote ICD transmission.

## 2023-07-15 ENCOUNTER — Other Ambulatory Visit: Payer: Self-pay | Admitting: Medical

## 2023-07-17 ENCOUNTER — Encounter

## 2023-08-01 ENCOUNTER — Ambulatory Visit: Attending: Cardiovascular Disease | Admitting: *Deleted

## 2023-08-01 DIAGNOSIS — Z7901 Long term (current) use of anticoagulants: Secondary | ICD-10-CM

## 2023-08-01 DIAGNOSIS — Z5181 Encounter for therapeutic drug level monitoring: Secondary | ICD-10-CM | POA: Diagnosis not present

## 2023-08-01 DIAGNOSIS — I513 Intracardiac thrombosis, not elsewhere classified: Secondary | ICD-10-CM | POA: Diagnosis not present

## 2023-08-01 DIAGNOSIS — I2102 ST elevation (STEMI) myocardial infarction involving left anterior descending coronary artery: Secondary | ICD-10-CM | POA: Diagnosis not present

## 2023-08-01 LAB — POCT INR: INR: 2.4 (ref 2.0–3.0)

## 2023-08-01 NOTE — Patient Instructions (Signed)
 Description   Continue taking warfarin 1 tablet daily except 1/2 tablet on Monday, Wednesday and Friday. Recheck INR in 6 weeks.  Coumadin  Clinic [phone] Main [phone]

## 2023-08-09 ENCOUNTER — Other Ambulatory Visit: Payer: Self-pay | Admitting: Cardiology

## 2023-08-25 ENCOUNTER — Ambulatory Visit (INDEPENDENT_AMBULATORY_CARE_PROVIDER_SITE_OTHER): Payer: Medicare HMO

## 2023-08-25 DIAGNOSIS — I255 Ischemic cardiomyopathy: Secondary | ICD-10-CM

## 2023-08-25 LAB — CUP PACEART REMOTE DEVICE CHECK
Battery Voltage: 85
Date Time Interrogation Session: 20250627094728
Implantable Lead Connection Status: 753985
Implantable Lead Implant Date: 20201210
Implantable Lead Location: 753860
Implantable Lead Model: 436909
Implantable Lead Serial Number: 8102423
Implantable Pulse Generator Implant Date: 20201210
Pulse Gen Model: 429525
Pulse Gen Serial Number: 84745362

## 2023-08-28 ENCOUNTER — Ambulatory Visit: Payer: Self-pay | Admitting: Internal Medicine

## 2023-08-30 ENCOUNTER — Other Ambulatory Visit: Payer: Self-pay | Admitting: Internal Medicine

## 2023-08-30 DIAGNOSIS — I513 Intracardiac thrombosis, not elsewhere classified: Secondary | ICD-10-CM

## 2023-08-30 NOTE — Telephone Encounter (Signed)
 Refill request fro warfarin:  Last INR was 2.4 on 08/01/23 Next INR due 09/12/23 LOV was 12/15/22  Refill approved.

## 2023-09-07 ENCOUNTER — Other Ambulatory Visit: Payer: Self-pay | Admitting: Medical

## 2023-09-12 ENCOUNTER — Ambulatory Visit: Attending: Cardiovascular Disease | Admitting: *Deleted

## 2023-09-12 DIAGNOSIS — I513 Intracardiac thrombosis, not elsewhere classified: Secondary | ICD-10-CM | POA: Diagnosis not present

## 2023-09-12 DIAGNOSIS — I2102 ST elevation (STEMI) myocardial infarction involving left anterior descending coronary artery: Secondary | ICD-10-CM | POA: Diagnosis not present

## 2023-09-12 DIAGNOSIS — Z5181 Encounter for therapeutic drug level monitoring: Secondary | ICD-10-CM | POA: Diagnosis not present

## 2023-09-12 LAB — POCT INR: INR: 2.6 (ref 2.0–3.0)

## 2023-09-12 NOTE — Progress Notes (Signed)
Please see anticoagulation encounter.

## 2023-09-12 NOTE — Patient Instructions (Signed)
 Description   Continue taking warfarin 1 tablet daily except 1/2 tablet on Monday, Wednesday and Friday. Recheck INR in 6 weeks.  Coumadin  Clinic [phone] Main [phone]

## 2023-09-18 ENCOUNTER — Encounter: Payer: Self-pay | Admitting: Medical

## 2023-10-06 ENCOUNTER — Ambulatory Visit: Attending: Cardiology | Admitting: Cardiology

## 2023-10-06 ENCOUNTER — Encounter: Payer: Self-pay | Admitting: Cardiology

## 2023-10-06 VITALS — BP 110/70 | HR 80 | Ht 68.0 in | Wt 214.0 lb

## 2023-10-06 DIAGNOSIS — R400 Somnolence: Secondary | ICD-10-CM

## 2023-10-06 DIAGNOSIS — R0683 Snoring: Secondary | ICD-10-CM

## 2023-10-06 DIAGNOSIS — I2510 Atherosclerotic heart disease of native coronary artery without angina pectoris: Secondary | ICD-10-CM

## 2023-10-06 DIAGNOSIS — I10 Essential (primary) hypertension: Secondary | ICD-10-CM | POA: Diagnosis not present

## 2023-10-06 DIAGNOSIS — I255 Ischemic cardiomyopathy: Secondary | ICD-10-CM

## 2023-10-06 DIAGNOSIS — I5022 Chronic systolic (congestive) heart failure: Secondary | ICD-10-CM | POA: Diagnosis not present

## 2023-10-06 DIAGNOSIS — E7800 Pure hypercholesterolemia, unspecified: Secondary | ICD-10-CM

## 2023-10-06 DIAGNOSIS — I513 Intracardiac thrombosis, not elsewhere classified: Secondary | ICD-10-CM

## 2023-10-06 DIAGNOSIS — D751 Secondary polycythemia: Secondary | ICD-10-CM | POA: Diagnosis not present

## 2023-10-06 NOTE — Patient Instructions (Signed)
 Medication Instructions:  Your physician recommends that you continue on your current medications as directed. Please refer to the Current Medication list given to you today.  *If you need a refill on your cardiac medications before your next appointment, please call your pharmacy*  Lab Work: Please complete a ferritin level in our first floor lab before you leave today.  If you have labs (blood work) drawn today and your tests are completely normal, you will receive your results only by: MyChart Message (if you have MyChart) OR A paper copy in the mail If you have any lab test that is abnormal or we need to change your treatment, we will call you to review the results.  Testing/Procedures: Your physician has recommended that you have a home sleep study. This test records several body functions during sleep, including: brain activity, eye movement, oxygen and carbon dioxide blood levels, heart rate and rhythm, breathing rate and rhythm, the flow of air through your mouth and nose, snoring, body muscle movements, and chest and belly movement.   Follow-Up: At [facility], you and your health needs are our priority.  As part of our continuing mission to provide you with exceptional heart care, our providers are all part of one team.  This team includes your primary Cardiologist (physician) and Advanced Practice Providers or APPs (Physician Assistants and Nurse Practitioners) who all work together to provide you with the care you need, when you need it.  Your next appointment:   1 year(s)  Provider:   Wilbert Bihari, MD

## 2023-10-06 NOTE — Addendum Note (Signed)
 Addended by: JANIT GENI CROME on: 10/06/2023 01:29 PM   Modules accepted: Orders

## 2023-10-06 NOTE — Progress Notes (Signed)
 Cardiology Office Note:    Date:  10/06/2023   ID:  Caleb Thomas, DOB 1960/07/17, MRN [id number]  PCP:  Bulah Alm RAMAN, PA-C  Cardiologist:  Wilbert Bihari, MD    Referring MD: Bulah Alm RAMAN, PA-C   Chief Complaint  Patient presents with   Coronary Artery Disease    Habits going on   Cardiomyopathy    Oh God   Congestive Heart Failure    Do I need to sit down   Hypertension   Hyperlipidemia     History of Present Illness:    Caleb Thomas is a 63 y.o. male with a hx of hyperlipidemia, hypertension, remote history of tobacco and ASCAD with admission 04/2018 with Anterior STEMI. Cath showed severe single-vessel CAD with occluded mid LAD and faint left-to-left collaterals. Given onset of symptoms 3-4 days ago and lack of ongoing chest pain,  recommended medical therapy, including 12 months of dual antiplatelet therapy with aspirin  and clopidogrel . EF at that time was 25% with dilated LV and anteroapical severe hypokinesis. Echo with contrast did not show LV thrombus but follow up cardiac MRI confirmed an LV thrombus and he was started on coumadin  and LifeVest was placed.    Followup 2D echo on max GDMT showed persistence of LV dysfunction with EF 25-30% .  He underwent AICD placement for primary prevention in 01/2019 and is followed in Device clinic by Dr. Waddell.    He is here today for follow-up and is doing well.  He denies any chest pain or pressure, PND, orthopnea, lower extremity edema, dizziness, syncope, palpitations.  He has mild DOE that he thinks has not changed since I saw I'm last.   Past Medical History:  Diagnosis Date   CAD (coronary artery disease), native coronary artery 04/2018   s/p anterior STEMI with severe single-vessel CAD with occluded mid LAD and faint left-to-left collaterals.   Chronic systolic (congestive) heart failure (HCC)    Current use of long term anticoagulation    GERD (gastroesophageal reflux disease)    Hyperlipidemia    Hypertension 2015    ICD (implantable cardioverter-defibrillator) in place    Impaired fasting blood sugar    Ischemic cardiomyopathy    EF 25-30% by echo 01/2019 s/p AICD implant   LV (left ventricular) mural thrombus    noted on cardiac MRI but not on echo with contrast>>on chronic warfarin    Past Surgical History:  Procedure Laterality Date   COLONOSCOPY  2013   normal per patient, [place]   COLONOSCOPY WITH PROPOFOL  N/A 02/26/2021   Procedure: COLONOSCOPY WITH PROPOFOL ;  Surgeon: Rollin Dover, MD;  Location: [facility];  Service: Endoscopy;  Laterality: N/A;   ESOPHAGOGASTRODUODENOSCOPY (EGD) WITH PROPOFOL  N/A 02/26/2021   Procedure: ESOPHAGOGASTRODUODENOSCOPY (EGD) WITH PROPOFOL ;  Surgeon: Rollin Dover, MD;  Location: [facility];  Service: Endoscopy;  Laterality: N/A;   ICD IMPLANT N/A 02/07/2019   Procedure: ICD IMPLANT;  Surgeon: Waddell Danelle ORN, MD;  Location: [facility] INVASIVE CV LAB;  Service: Cardiovascular;  Laterality: N/A;   LEFT HEART CATH AND CORONARY ANGIOGRAPHY N/A 05/06/2018   Procedure: LEFT HEART CATH AND CORONARY ANGIOGRAPHY;  Surgeon: Mady Bruckner, MD;  Location: [facility] INVASIVE CV LAB;  Service: Cardiovascular;  Laterality: N/A;   POLYPECTOMY  02/26/2021   Procedure: POLYPECTOMY;  Surgeon: Rollin Dover, MD;  Location: [facility];  Service: Endoscopy;;    Current Medications: Current Meds  Medication Sig   atorvastatin  (LIPITOR ) 80 MG tablet Take 1 tablet by mouth once daily   clopidogrel  (  PLAVIX ) 75 MG tablet Take 1 tablet by mouth once daily   famotidine  (PEPCID ) 20 MG tablet TAKE 1 TABLET BY MOUTH ONCE OR TWICE DAILY FOR  REFLUX   JARDIANCE  10 MG TABS tablet TAKE 1 TABLET BY MOUTH ONCE DAILY BEFORE BREAKFAST   metoprolol  (TOPROL -XL) 200 MG 24 hr tablet TAKE 1 TABLET BY MOUTH ONCE DAILY (STOPPING CARVEDILOL )   Multiple Vitamins-Minerals (CENTRUM ADULT PO) Take 1 tablet by mouth daily.   pantoprazole  (PROTONIX ) 40 MG tablet TAKE 1 TABLET BY MOUTH ONCE DAILY 45 MINUTES  BEFORE BREAKFAST   sacubitril-valsartan (ENTRESTO ) 97-103 MG Take 1 tablet by mouth 2 (two) times daily.   spironolactone  (ALDACTONE ) 25 MG tablet Take 1 tablet by mouth once daily   VASCEPA  1 g capsule Take 2 capsules by mouth twice daily   warfarin (COUMADIN ) 5 MG tablet TAKE 1/2 TO 1 (ONE-HALF TO ONE) TABLET BY MOUTH ONCE DAILY AS DIRECTED BY THE ANTICOAGULATION CLINIC     Allergies:   Patient has no known allergies.   Social History   Socioeconomic History   Marital status: Married    Spouse name: Not on file   Number of children: Not on file   Years of education: Not on file   Highest education level: Not on file  Occupational History   Not on file  Tobacco Use   Smoking status: Former   Smokeless tobacco: Never  Vaping Use   Vaping status: Never Used  Substance and Sexual Activity   Alcohol use: Not Currently    Alcohol/week: 3.0 standard drinks of alcohol    Types: 3 Cans of beer per week    Comment: on weekend    Drug use: Not Currently   Sexual activity: Not on file  Other Topics Concern   Not on file  Social History Narrative   Married, 10 children, 4 grandchildren.  Former Engineer, civil (consulting) at [employer].  Walks for exercise.   Disabled   05/2023   Social Drivers of Health   Financial Resource Strain: Low Risk  (06/06/2023)   Overall Financial Resource Strain (CARDIA)    Difficulty of Paying Living Expenses: Not hard at all  Food Insecurity: No Food Insecurity (06/06/2023)   Hunger Vital Sign    Worried About Running Out of Food in the Last Year: Never true    Ran Out of Food in the Last Year: Never true  Transportation Needs: No Transportation Needs (06/06/2023)   PRAPARE - Administrator, Civil Service (Medical): No    Lack of Transportation (Non-Medical): No  Physical Activity: Sufficiently Active (06/06/2023)   Exercise Vital Sign    Days of Exercise per Week: 7 days    Minutes of Exercise per Session: 40 min  Stress: No Stress Concern Present  (06/06/2023)   [employer] of Occupational Health - Occupational Stress Questionnaire    Feeling of Stress : Not at all  Social Connections: Moderately Integrated (06/06/2023)   Social Connection and Isolation Panel    Frequency of Communication with Friends and Family: Three times a week    Frequency of Social Gatherings with Friends and Family: Not on file    Attends Religious Services: More than 4 times per year    Active Member of [employer] or Organizations: No    Attends Banker Meetings: Never    Marital Status: Married     Family History: The patient's family history includes Heart disease in his father; Hypertension in his brother and brother; Stroke  in his brother and mother. There is no history of Cancer.  ROS:   Please see the history of present illness.    ROS  All other systems reviewed and negative.   EKGs/Labs/Other Studies Reviewed:    The following studies were reviewed today:  2D echo 10/2018 IMPRESSIONS    1. The left ventricle has severely reduced systolic function, with an  ejection fraction of 25-30%. The cavity size was normal. Severe basal  septal hypertrophy. Left ventricular diastolic Doppler parameters are  consistent with pseudonormalization.   2. There is akinessis of the mid anteroseptal and inferoseptal, apical  and apical lateral walls. There is akinesis of the mid and apical  inferior, anterior and apical inferolateral walls. No evidence of apical  thrombus by definity  contrast study.   3. The right ventricle has normal systolic function. The cavity was  normal. There is no increase in right ventricular wall thickness. Right  ventricular systolic pressure is normal with an estimated pressure of 32.3  mmHg.   4. Left atrial size was mildly dilated.   5. The aortic valve is tricuspid. Moderate sclerosis of the aortic valve.  Aortic valve regurgitation is trivial by color flow Doppler.   6. The aorta is normal unless otherwise noted.     Recent Labs: 06/22/2023: ALT 42; BUN 20; Creatinine, Ser 1.24; Hemoglobin 18.9; Platelets 236; Potassium 4.1; Sodium 142   Recent Lipid Panel    Component Value Date/Time   CHOL 108 06/22/2023 0837   TRIG 143 06/22/2023 0837   HDL 27 (L) 06/22/2023 0837   CHOLHDL 4.0 06/22/2023 0837   CHOLHDL 9.5 05/07/2018 0654   VLDL 40 05/07/2018 0654   LDLCALC 56 06/22/2023 0837   LDLCALC 85 03/06/2017 1138    Physical Exam:    VS:  BP 110/70   Pulse 80   Ht 5' 8 (1.727 m)   Wt 214 lb (97.1 kg)   SpO2 96%   BMI 32.54 kg/m     Wt Readings from Last 3 Encounters:  10/06/23 214 lb (97.1 kg)  06/22/23 205 lb 9.6 oz (93.3 kg)  12/15/22 209 lb 6.4 oz (95 kg)    GEN: Well nourished, well developed in no acute distress HEENT: Normal NECK: No JVD; No carotid bruits LYMPHATICS: No lymphadenopathy CARDIAC:RRR, no murmurs, rubs, gallops RESPIRATORY:  Clear to auscultation without rales, wheezing or rhonchi  ABDOMEN: Soft, non-tender, non-distended MUSCULOSKELETAL:  No edema; No deformity  SKIN: Warm and dry NEUROLOGIC:  Alert and oriented x 3 PSYCHIATRIC:  Normal affect  ASSESSMENT:    1. Coronary artery disease involving native coronary artery of native heart without angina pectoris   2. Ischemic cardiomyopathy   3. Chronic systolic (congestive) heart failure (HCC)   4. Essential hypertension   5. Pure hypercholesterolemia   6. LV (left ventricular) mural thrombus   7. Erythrocytosis      PLAN:    In order of problems listed above:  ASCAD -s/p Anterior STEMI 04/2018 with cath showing severe single-vessel CAD with occluded mid LAD and faint left-to-left collaterals. Given onset of symptoms 3-4 days ago and lack of ongoing chest pain,  recommended medical therapy -He has not had any anginal symptoms since I saw him last -Continue Plavix  75 mg daily, atorvastatin  80 mg daily, Toprol  XL 200 mg daily with as needed refills -No ASA due to need for warfarin for history of LV  thrombus and persistent LV dysfunction  Ischemic DCM/chronic systolic CHF - Cardiac MRI in 7979 revealed EF  38% with akinesis of the mid anterior, anterior septal, inferoseptal and apical anterior and septal walls.  - EF 25-30% by echo 01/2019 s/p AICD for primary prevention -Repeat 2D echo 01/2021 showed EF 30 to 35% with akinesis of the mid to apical septal wall, anterior wall and inferior wall with no LV thrombus by Definity  -s/p AICD followed in device clinic by EP -He appears euvolemic on exam today -His last hemoglobin was high at 18.9.  It has been slowly trending upward from 15.6 in 2020.  I am going to check a ferritin level to rule out hemochromatosis and also a home sleep study to rule out obstructive sleep apnea -Continue Jardiance  10 mg daily, Toprol  XL 200 mg daily, Entresto  97-23 mg twice daily, spironolactone  25 mg daily with as needed refill -I have personally reviewed and interpreted outside labs performed by patient's PCP which showed serum creatinine 1.2, potassium 4.1 on 06/22/2023  HTN -BP is controlled on exam today - Continue spironolactone  25 mg daily, Entresto  97-23 mg twice daily, Toprol -XL 200 mg daily, Jardiance  10 mg daily with as needed refills  HLD -LDL goal < 70 -I have personally reviewed and interpreted outside labs performed by patient's PCP which showed LDL 56, HDL 27, ALT 42 on 5/75/7974 -Continue atorvastatin  80 mg daily and vascepa  2gm BID with as needed refills . LV thrombus -found to have LV thrombus on Cardiac MRI - not noted on echo with contrast and is on warfarin -INR followed in coumadin  clinic -No bleeding problems on warfarin -I have personally reviewed and interpreted outside labs performed by patient's PCP which showed hemoglobin 18.9 on 06/22/2023  Erythrocytosis - Will get a home sleep study to rule out obstructive sleep apnea>>Stop Bang score 5 - Check a ferritin level rule out hemochromatosis  Medication Adjustments/Labs and Tests  Ordered: Current medicines are reviewed at length with the patient today.  Concerns regarding medicines are outlined above.  No orders of the defined types were placed in this encounter.   No orders of the defined types were placed in this encounter.  Followup with me in 1 year  Signed, Wilbert Bihari, MD  10/06/2023 1:08 PM    [facility]

## 2023-10-07 LAB — FERRITIN: Ferritin: 181 ng/mL (ref 30–400)

## 2023-10-08 ENCOUNTER — Ambulatory Visit: Payer: Self-pay | Admitting: Cardiology

## 2023-10-20 ENCOUNTER — Other Ambulatory Visit: Payer: Self-pay | Admitting: Cardiology

## 2023-10-24 ENCOUNTER — Ambulatory Visit: Attending: Cardiovascular Disease | Admitting: Pharmacist

## 2023-10-24 DIAGNOSIS — I2102 ST elevation (STEMI) myocardial infarction involving left anterior descending coronary artery: Secondary | ICD-10-CM

## 2023-10-24 DIAGNOSIS — I513 Intracardiac thrombosis, not elsewhere classified: Secondary | ICD-10-CM

## 2023-10-24 DIAGNOSIS — Z5181 Encounter for therapeutic drug level monitoring: Secondary | ICD-10-CM

## 2023-10-24 LAB — POCT INR: INR: 2.8 (ref 2.0–3.0)

## 2023-10-24 NOTE — Patient Instructions (Signed)
 Description   Continue taking warfarin 1 tablet daily except 1/2 tablet on Monday, Wednesday and Friday. Recheck INR in 6 weeks.  Coumadin  Clinic [phone] Main [phone]

## 2023-10-24 NOTE — Progress Notes (Signed)
 INR 2.8; Please see anticoagulation encounter   Description   Continue taking warfarin 1 tablet daily except 1/2 tablet on Monday, Wednesday and Friday. Recheck INR in 6 weeks.  Coumadin  Clinic [phone] Main [phone]

## 2023-10-27 NOTE — Progress Notes (Signed)
 Remote ICD transmission.

## 2023-11-07 ENCOUNTER — Other Ambulatory Visit: Payer: Self-pay | Admitting: Cardiology

## 2023-11-08 ENCOUNTER — Other Ambulatory Visit: Payer: Self-pay | Admitting: Cardiology

## 2023-11-22 ENCOUNTER — Other Ambulatory Visit: Payer: Self-pay | Admitting: Cardiology

## 2023-11-22 DIAGNOSIS — I513 Intracardiac thrombosis, not elsewhere classified: Secondary | ICD-10-CM

## 2023-11-22 NOTE — Telephone Encounter (Signed)
 Refill request for warfarin:  Last INR was 2.8 on 10/24/23 Next INR due 12/05/23 LOV was 10/06/23  Refill approved.

## 2023-11-24 ENCOUNTER — Ambulatory Visit (INDEPENDENT_AMBULATORY_CARE_PROVIDER_SITE_OTHER): Payer: Medicare HMO

## 2023-11-24 DIAGNOSIS — I255 Ischemic cardiomyopathy: Secondary | ICD-10-CM | POA: Diagnosis not present

## 2023-11-25 LAB — CUP PACEART REMOTE DEVICE CHECK
Date Time Interrogation Session: 20250926103311
Implantable Lead Connection Status: 753985
Implantable Lead Implant Date: 20201210
Implantable Lead Location: 753860
Implantable Lead Model: 436909
Implantable Lead Serial Number: 8102423
Implantable Pulse Generator Implant Date: 20201210
Pulse Gen Model: 429525
Pulse Gen Serial Number: 84745362

## 2023-11-26 ENCOUNTER — Ambulatory Visit: Payer: Self-pay | Admitting: Internal Medicine

## 2023-11-29 NOTE — Progress Notes (Signed)
 Remote ICD Transmission

## 2023-12-05 ENCOUNTER — Ambulatory Visit: Attending: Cardiovascular Disease | Admitting: Pharmacist

## 2023-12-05 DIAGNOSIS — I2102 ST elevation (STEMI) myocardial infarction involving left anterior descending coronary artery: Secondary | ICD-10-CM | POA: Diagnosis not present

## 2023-12-05 DIAGNOSIS — Z5181 Encounter for therapeutic drug level monitoring: Secondary | ICD-10-CM | POA: Diagnosis not present

## 2023-12-05 DIAGNOSIS — I513 Intracardiac thrombosis, not elsewhere classified: Secondary | ICD-10-CM | POA: Diagnosis not present

## 2023-12-05 DIAGNOSIS — Z7902 Long term (current) use of antithrombotics/antiplatelets: Secondary | ICD-10-CM | POA: Diagnosis not present

## 2023-12-05 LAB — POCT INR: INR: 2 (ref 2.0–3.0)

## 2023-12-05 NOTE — Patient Instructions (Signed)
 Description   INR 2.0: Continue taking warfarin 1 tablet daily except 1/2 tablet on Monday, Wednesday and Friday. Recheck INR in 6 weeks.  Coumadin  Clinic [phone] Main [phone]

## 2023-12-05 NOTE — Progress Notes (Signed)
 Description   INR 2.0: Continue taking warfarin 1 tablet daily except 1/2 tablet on Monday, Wednesday and Friday. Recheck INR in 6 weeks.  Coumadin  Clinic [phone] Main [phone]

## 2023-12-08 ENCOUNTER — Telehealth: Payer: Self-pay

## 2023-12-08 NOTE — Telephone Encounter (Signed)
 Ordering provider: Dr. Shlomo Associated diagnoses: Snoring [R06.83]; Daytime sleepiness [R40.0]  Patient NOT notified of PIN (1234) on 12/08/2023   Spoke with patient and patient stated that he will come by next week to pick up device.  Patient verbalized understanding of the pick up days and hours for that week.    Phone note routed to covering staff for follow-up.

## 2023-12-11 NOTE — Telephone Encounter (Signed)
**Note De-Identified Beck Cofer Obfuscation** Patient agreement reviewed and signed on 12/11/2023.  WatchPAT issued to patient on 12/11/2023 by Allyanna Appleman, Avelina HERO, LPN. Patient aware to not open the WatchPAT box until contacted with the activation PIN. Patient profile initialized in CloudPAT on 12/11/2023 by Avelina Genevieve Arbaugh, LPN. Device serial number: [id number]  Please list Reason for Call as Advice Only and type WatchPAT issued to patient in the comment box.

## 2023-12-13 ENCOUNTER — Encounter (HOSPITAL_BASED_OUTPATIENT_CLINIC_OR_DEPARTMENT_OTHER): Payer: Self-pay | Admitting: Cardiology

## 2023-12-13 DIAGNOSIS — G4733 Obstructive sleep apnea (adult) (pediatric): Secondary | ICD-10-CM | POA: Diagnosis not present

## 2023-12-14 ENCOUNTER — Ambulatory Visit: Attending: Cardiology

## 2023-12-14 DIAGNOSIS — R0683 Snoring: Secondary | ICD-10-CM

## 2023-12-14 DIAGNOSIS — R400 Somnolence: Secondary | ICD-10-CM

## 2023-12-14 NOTE — Procedures (Signed)
     SLEEP STUDY REPORT Patient Information Study Date: 12/13/2023 Patient Name: Caleb Thomas Patient ID: [id number] Birth Date: Jun 21, 1960 Age: 63 Gender: Male BMI: 32.4 (W=214 lb, H=5' 8'') Referring Physician: Wilbert Bihari, MD  TEST DESCRIPTION: Home sleep apnea testing was completed using the WatchPat, a Type 1 device, utilizing  peripheral arterial tonometry (PAT), chest movement, actigraphy, pulse oximetry, pulse rate, body position and snore.  AHI was calculated with apnea and hypopnea using valid sleep time as the denominator. RDI includes apneas,  hypopneas, and RERAs. The data acquired and the scoring of sleep and all associated events were performed in  accordance with the recommended standards and specifications as outlined in the AASM Manual for the Scoring of  Sleep and Associated Events 2.2.0 (2015).   FINDINGS:   1. Mild Obstructive Sleep Apnea with AHI 13.4/hr overall and moderate during REM sleep with REM AHI 25.5/hr.   2. No Central Sleep Apnea with pAHIc 5.3/hr.   3. Oxygen desaturations as low as 86%.   4. Severe snoring was present. O2 sats were < 88% for 0.2 min.   5. Total sleep time was 4 hrs and 11 min.   6. 30.6% of total sleep time was spent in REM sleep.   7. Normal sleep onset latency at 30 min.   8. Shortened REM sleep onset latency at 57 min.   9. Total awakenings were 5.  10. Arrhythmia detection: None  DIAGNOSIS: Mild to Moderate Obstructive Sleep Apnea (G47.33)  RECOMMENDATIONS: 1. Clinical correlation of these findings is necessary. The decision to treat obstructive sleep apnea (OSA) is usually  based on the presence of apnea symptoms or the presence of associated medical conditions such as Hypertension,  Congestive Heart Failure, Atrial Fibrillation or Obesity. The most common symptoms of OSA are snoring, gasping for  breath while sleeping, daytime sleepiness and fatigue.  2. Initiating apnea therapy is recommended given the presence of  symptoms and/or associated conditions.  Recommend proceeding with one of the following:  a. Auto-CPAP therapy with a pressure range of 5-20cm H2O.  b. An oral appliance (OA) that can be obtained from certain dentists with expertise in sleep medicine. These are  primarily of use in non-obese patients with mild and moderate disease.  c. An ENT consultation which may be useful to look for specific causes of obstruction and possible treatment  options.  d. If patient is intolerant to PAP therapy, consider referral to ENT for evaluation for hypoglossal nerve stimulator.  3. Close follow-up is necessary to ensure success with CPAP or oral appliance therapy for maximum benefit . 4. A follow-up oximetry study on CPAP is recommended to assess the adequacy of therapy and determine the need  for supplemental oxygen or the potential need for Bi-level therapy. An arterial blood gas to determine the adequacy of  baseline ventilation and oxygenation should also be considered. 5. Healthy sleep recommendations include: adequate nightly sleep (normal 7-9 hrs/night), avoidance of caffeine after  noon and alcohol near bedtime, and maintaining a sleep environment that is cool, dark and quiet. 6. Weight loss for overweight patients is recommended. Even modest amounts of weight loss can significantly  improve the severity of sleep apnea. 7. Snoring recommendations include: weight loss where appropriate, side sleeping, and avoidance of alcohol before  bed. 8. Operation of motor vehicle should be avoided when sleepy.  Signature: Wilbert Bihari, MD; [facility]; Diplomat, American Board of Sleep  Medicine Electronically Signed: 12/14/2023 2:49:41 PM

## 2023-12-30 DIAGNOSIS — Z7901 Long term (current) use of anticoagulants: Secondary | ICD-10-CM | POA: Diagnosis not present

## 2023-12-30 DIAGNOSIS — Z7984 Long term (current) use of oral hypoglycemic drugs: Secondary | ICD-10-CM | POA: Diagnosis not present

## 2023-12-30 DIAGNOSIS — I252 Old myocardial infarction: Secondary | ICD-10-CM | POA: Diagnosis not present

## 2023-12-30 DIAGNOSIS — E785 Hyperlipidemia, unspecified: Secondary | ICD-10-CM | POA: Diagnosis not present

## 2023-12-30 DIAGNOSIS — K219 Gastro-esophageal reflux disease without esophagitis: Secondary | ICD-10-CM | POA: Diagnosis not present

## 2023-12-30 DIAGNOSIS — Z87891 Personal history of nicotine dependence: Secondary | ICD-10-CM | POA: Diagnosis not present

## 2023-12-30 DIAGNOSIS — Z8249 Family history of ischemic heart disease and other diseases of the circulatory system: Secondary | ICD-10-CM | POA: Diagnosis not present

## 2023-12-30 DIAGNOSIS — I110 Hypertensive heart disease with heart failure: Secondary | ICD-10-CM | POA: Diagnosis not present

## 2023-12-30 DIAGNOSIS — I509 Heart failure, unspecified: Secondary | ICD-10-CM | POA: Diagnosis not present

## 2024-01-12 ENCOUNTER — Other Ambulatory Visit: Payer: Self-pay | Admitting: Cardiology

## 2024-01-16 ENCOUNTER — Ambulatory Visit: Attending: Cardiovascular Disease | Admitting: *Deleted

## 2024-01-16 DIAGNOSIS — Z7902 Long term (current) use of antithrombotics/antiplatelets: Secondary | ICD-10-CM | POA: Diagnosis not present

## 2024-01-16 DIAGNOSIS — I513 Intracardiac thrombosis, not elsewhere classified: Secondary | ICD-10-CM | POA: Diagnosis not present

## 2024-01-16 DIAGNOSIS — Z5181 Encounter for therapeutic drug level monitoring: Secondary | ICD-10-CM

## 2024-01-16 LAB — POCT INR: INR: 2.9 (ref 2.0–3.0)

## 2024-01-16 NOTE — Patient Instructions (Signed)
 Description   INR 2.9; Continue taking warfarin 1 tablet daily except 1/2 tablet on Monday, Wednesday and Friday. Recheck INR in 6 weeks.  Coumadin  Clinic [phone] Main [phone]

## 2024-01-16 NOTE — Progress Notes (Signed)
 Description   INR 2.9; Continue taking warfarin 1 tablet daily except 1/2 tablet on Monday, Wednesday and Friday. Recheck INR in 6 weeks.  Coumadin  Clinic [phone] Main [phone]

## 2024-01-17 ENCOUNTER — Other Ambulatory Visit: Payer: Self-pay | Admitting: Medical

## 2024-01-29 ENCOUNTER — Telehealth: Payer: Self-pay

## 2024-01-29 NOTE — Telephone Encounter (Signed)
 Biotronik alert received for 1 VF event 01/27/24 @ 09:03 AM that was successfully terminated w/ clean break X1 40J shock then converted to regular rhythm.   Attempted to contact patient. No answer, left message to call back.  Mychart message sent.

## 2024-01-30 ENCOUNTER — Telehealth: Payer: Self-pay | Admitting: *Deleted

## 2024-01-30 DIAGNOSIS — R0683 Snoring: Secondary | ICD-10-CM

## 2024-01-30 DIAGNOSIS — I2510 Atherosclerotic heart disease of native coronary artery without angina pectoris: Secondary | ICD-10-CM

## 2024-01-30 DIAGNOSIS — R400 Somnolence: Secondary | ICD-10-CM

## 2024-01-30 DIAGNOSIS — G4733 Obstructive sleep apnea (adult) (pediatric): Secondary | ICD-10-CM

## 2024-01-30 DIAGNOSIS — I5022 Chronic systolic (congestive) heart failure: Secondary | ICD-10-CM

## 2024-01-30 DIAGNOSIS — I10 Essential (primary) hypertension: Secondary | ICD-10-CM

## 2024-01-30 NOTE — Telephone Encounter (Signed)
 The patient has been notified of the result and verbalized understanding.  All questions (if any) were answered. Joshua Dalton Seip, CMA 01/30/2024 11:06 AM    Precet Titration

## 2024-01-30 NOTE — Telephone Encounter (Signed)
-----   Message from Wilbert Bihari sent at 12/14/2023  2:50 PM EDT ----- Please let patient know that they have sleep apnea.  Recommend therapeutic CPAP titration for treatment of patient's sleep disordered breathing.

## 2024-01-30 NOTE — Telephone Encounter (Signed)
 I called and spoke with the patient regarding VF episode and shock that occurred Saturday morning (01/27/24) at 0903. Inquired if the patient was aware of the shock. Per [name], he was not aware he was shocked and stated he did not wake up until ~ 1000 that day.  Confirmed with [name] that he did not have any symptoms once he got up that morning nor has he had symptoms since that time.   Also confirmed no new change in medications, no missed doses of medications, and no new illness.   Advised the patient that we would like to follow up with him in the office. I have also advised the patient of [place] DMV driving restrictions x 6 months from the date of his shock. The patient voices understanding and is agreeable.   There is currently no EP APP availability or appts with Dr. Waddell. Will review with Dr. Cindie if he is willing to see the patient and call him back.

## 2024-01-30 NOTE — Telephone Encounter (Signed)
 Reviewed with Dr. Cindie via secure chat.  He is ok with seeing the patient in follow up.   I spoke with the patient and is he agreeable with seeing Dr. Cindie on Monday 02/05/24 at 10:30 am. Caleb Thomas is aware of our location and to check in on the 5th floor about 20 minutes ahead of his scheduled appointment.  He voices understanding and was appreciative of the call.

## 2024-02-04 NOTE — Progress Notes (Unsigned)
  Electrophysiology Office Follow up Visit Note:    Date:  02/04/2024   ID:  Caleb Thomas, DOB 11/09/1960, MRN [id number]  PCP:  Bulah Alm RAMAN, PA-C  [facility] Cardiologist:  Wilbert Bihari, MD  [facility] Electrophysiologist:  Danelle Birmingham, MD    Interval History:     Caleb Thomas is a 63 y.o. male who presents for a follow up visit.   The patient last saw December 15, 2022.  The patient has a history of coronary artery disease, chronic systolic heart failure secondary to ischemic cardiomyopathy, ICD in situ, LV mural thrombus, hypertension, hyperlipidemia.  The patient was previously followed by Dr. Birmingham.  At the appointment with Brandi in 2024, the patient was doing well without any signs of heart failure.  The patient received an ICD shock on November 29.  There was appropriate detection and treatment of the arrhythmia.  The patient was asleep at the time of his shock.      Past medical, surgical, social and family history were reviewed.  ROS:   Please see the history of present illness.    All other systems reviewed and are negative.  EKGs/Labs/Other Studies Reviewed:    The following studies were reviewed today:  February 05, 2024 in-clinic device interrogation personally reviewed Reviewed the above ICD shock episode.  There was appropriate sensing of ventricular fibrillation.  He actually had another episode of ventricular tachycardia yesterday that required ATP.  It was at 3 AM.  He did not feel anything with that episode.  Battery and lead parameters are stable.        Physical Exam:    VS:  There were no vitals taken for this visit.    Wt Readings from Last 3 Encounters:  10/06/23 214 lb (97.1 kg)  06/22/23 205 lb 9.6 oz (93.3 kg)  12/15/22 209 lb 6.4 oz (95 kg)     GEN: no distress CARD: RRR, No MRG.  Generator pocket well-healed RESP: No IWOB. CTAB.      ASSESSMENT:    No diagnosis found. PLAN:    In order of problems listed  above:  #Ventricular tachycardia The patient has had multiple episodes of treated ventricular arrhythmias.  These are secondary to his ischemic cardiomyopathy.  Given the recurrent nature of these arrhythmias, I have recommended suppression with an antiarrhythmic drug.  I discussed amiodarone and mexiletine with the patient.  Given his relatively young age, favor starting with mexiletine.  If he has breakthrough episodes of arrhythmias, favor adding amiodarone.  Also discussed ventricular tachycardia ablation.  This could also be entertained if he fails medical therapy.  Start mexiletine 150 mg by mouth twice daily.  Check a BMP and magnesium .  #ICD in situ Device functioning appropriately.  Continue remote monitoring  #Chronic systolic heart failure NYHA class II.  Warm and dry on exam Continue Jardiance , metoprolol , Entresto , spironolactone   I discussed my upcoming departure from [facility] during today's clinic appointment.  The patient will continue to follow-up with one of my EP partners moving forward.   Follow-up with EP APP in 6 to 8 weeks.    Signed, Ole Holts, MD, [facility], [facility] 02/04/2024 11:56 AM    Electrophysiology [facility]

## 2024-02-05 ENCOUNTER — Telehealth: Payer: Self-pay | Admitting: Pharmacy Technician

## 2024-02-05 ENCOUNTER — Other Ambulatory Visit: Payer: Self-pay

## 2024-02-05 ENCOUNTER — Ambulatory Visit: Attending: Cardiology | Admitting: Cardiology

## 2024-02-05 ENCOUNTER — Encounter: Payer: Self-pay | Admitting: Cardiology

## 2024-02-05 VITALS — BP 130/66 | HR 77 | Ht 68.0 in | Wt 216.0 lb

## 2024-02-05 DIAGNOSIS — I472 Ventricular tachycardia, unspecified: Secondary | ICD-10-CM

## 2024-02-05 DIAGNOSIS — I5022 Chronic systolic (congestive) heart failure: Secondary | ICD-10-CM

## 2024-02-05 DIAGNOSIS — Z95810 Presence of automatic (implantable) cardiac defibrillator: Secondary | ICD-10-CM | POA: Diagnosis not present

## 2024-02-05 DIAGNOSIS — I2510 Atherosclerotic heart disease of native coronary artery without angina pectoris: Secondary | ICD-10-CM

## 2024-02-05 MED ORDER — MEXILETINE HCL 150 MG PO CAPS
150.0000 mg | ORAL_CAPSULE | Freq: Two times a day (BID) | ORAL | 3 refills | Status: AC
  Filled 2024-02-27: qty 180, 90d supply, fill #0

## 2024-02-05 NOTE — Patient Instructions (Addendum)
 Medication Instructions:  Your physician has recommended you make the following change in your medication:  1) START taking mexiletine 150 mg twice daily   *If you need a refill on your cardiac medications before your next appointment, please call your pharmacy*  Labs: TODAY: BMET and Mag - please stop at [facility] on the 1st floor  Follow-Up: At [facility], you and your health needs are our priority.  As part of our continuing mission to provide you with exceptional heart care, our providers are all part of one team.  This team includes your primary Cardiologist (physician) and Advanced Practice Providers or APPs (Physician Assistants and Nurse Practitioners) who all work together to provide you with the care you need, when you need it.  Your next appointment:   3 months  Provider:   You will see one of the following Advanced Practice Providers on your designated Care Team:   Charlies Arthur, [place] Ozell Jodie Passey, PA-C Suzann Riddle, NP Daphne Barrack, NP Artist Pouch, PA-C

## 2024-02-05 NOTE — Telephone Encounter (Signed)
   Pharmacy Patient Advocate Encounter   Received notification from CoverMyMeds that prior authorization for MEXILETINE is required/requested.   Insurance verification completed.   The patient is insured through [employer].   Per test claim: PA required; PA started via CoverMyMeds. KEY [id number] . Waiting for clinical questions to populate.

## 2024-02-06 LAB — CUP PACEART INCLINIC DEVICE CHECK
Date Time Interrogation Session: 20251208120637
Implantable Lead Connection Status: 753985
Implantable Lead Implant Date: 20201210
Implantable Lead Location: 753860
Implantable Lead Model: 436909
Implantable Lead Serial Number: 8102423
Implantable Pulse Generator Implant Date: 20201210
Pulse Gen Model: 429525
Pulse Gen Serial Number: 84745362

## 2024-02-06 LAB — BASIC METABOLIC PANEL WITH GFR
BUN/Creatinine Ratio: 10 (ref 10–24)
BUN: 13 mg/dL (ref 8–27)
CO2: 21 mmol/L (ref 20–29)
Calcium: 8.8 mg/dL (ref 8.6–10.2)
Chloride: 110 mmol/L — ABNORMAL HIGH (ref 96–106)
Creatinine, Ser: 1.29 mg/dL — ABNORMAL HIGH (ref 0.76–1.27)
Glucose: 82 mg/dL (ref 70–99)
Potassium: 4.1 mmol/L (ref 3.5–5.2)
Sodium: 143 mmol/L (ref 134–144)
eGFR: 62 mL/min/1.73 (ref >59)

## 2024-02-06 LAB — MAGNESIUM: Magnesium: 2.1 mg/dL (ref 1.6–2.3)

## 2024-02-06 NOTE — Telephone Encounter (Signed)
 Still saying: [name] is processing your PA request and will respond shortly with next steps. You may close this dialog, return to your dashboard, and perform other tasks. To check for an update later, open this request again from your dashboard. If you need assistance, please chat with CoverMyMeds or call us  at [phone].

## 2024-02-07 ENCOUNTER — Other Ambulatory Visit: Payer: Self-pay | Admitting: Cardiology

## 2024-02-07 DIAGNOSIS — E7800 Pure hypercholesterolemia, unspecified: Secondary | ICD-10-CM

## 2024-02-08 ENCOUNTER — Other Ambulatory Visit (HOSPITAL_COMMUNITY): Payer: Self-pay

## 2024-02-09 ENCOUNTER — Other Ambulatory Visit (HOSPITAL_COMMUNITY): Payer: Self-pay

## 2024-02-09 NOTE — Telephone Encounter (Signed)
 I called insurance at [phone] and they said still under review  [id number] eoc

## 2024-02-12 ENCOUNTER — Other Ambulatory Visit (HOSPITAL_COMMUNITY): Payer: Self-pay

## 2024-02-13 ENCOUNTER — Other Ambulatory Visit (HOSPITAL_COMMUNITY): Payer: Self-pay

## 2024-02-14 ENCOUNTER — Other Ambulatory Visit (HOSPITAL_COMMUNITY): Payer: Self-pay

## 2024-02-14 NOTE — Telephone Encounter (Signed)
 Pharmacy Patient Advocate Encounter  Received notification from [employer] that Prior Authorization for mexiletine 150mg   has been APPROVED from 02/14/24 to 02/27/25   PA #/Case ID/Reference #: [id number]  Jluy#[id number] had to call [employer] to answer more questions over the phone

## 2024-02-23 ENCOUNTER — Ambulatory Visit: Payer: Medicare HMO

## 2024-02-23 DIAGNOSIS — I472 Ventricular tachycardia, unspecified: Secondary | ICD-10-CM | POA: Diagnosis not present

## 2024-02-25 ENCOUNTER — Ambulatory Visit: Payer: Self-pay | Admitting: Cardiology

## 2024-02-25 LAB — CUP PACEART REMOTE DEVICE CHECK
Date Time Interrogation Session: 20251226172235
Implantable Lead Connection Status: 753985
Implantable Lead Implant Date: 20201210
Implantable Lead Location: 753860
Implantable Lead Model: 436909
Implantable Lead Serial Number: 8102423
Implantable Pulse Generator Implant Date: 20201210
Pulse Gen Model: 429525
Pulse Gen Serial Number: 84745362

## 2024-02-26 NOTE — Telephone Encounter (Signed)
**Note De-Identified Bryn Saline Obfuscation** I started a CPAP Titration PA through the Avail[facility] Provider Portal and it is currently pending review.

## 2024-02-26 NOTE — Progress Notes (Signed)
 Remote ICD Transmission

## 2024-02-27 ENCOUNTER — Other Ambulatory Visit (HOSPITAL_COMMUNITY): Payer: Self-pay

## 2024-02-27 ENCOUNTER — Other Ambulatory Visit: Payer: Self-pay

## 2024-02-27 ENCOUNTER — Ambulatory Visit: Attending: Cardiovascular Disease | Admitting: *Deleted

## 2024-02-27 DIAGNOSIS — I513 Intracardiac thrombosis, not elsewhere classified: Secondary | ICD-10-CM | POA: Diagnosis not present

## 2024-02-27 DIAGNOSIS — Z7902 Long term (current) use of antithrombotics/antiplatelets: Secondary | ICD-10-CM

## 2024-02-27 DIAGNOSIS — Z5181 Encounter for therapeutic drug level monitoring: Secondary | ICD-10-CM | POA: Diagnosis not present

## 2024-02-27 LAB — POCT INR: INR: 2.2 (ref 2.0–3.0)

## 2024-02-27 NOTE — Patient Instructions (Signed)
 Description   INR 2.2; Continue taking warfarin 1 tablet daily except 1/2 tablet on Monday, Wednesday and Friday. Recheck INR in 6 weeks.  Coumadin  Clinic [phone] Main [phone] Procedure Fax [phone]

## 2024-02-27 NOTE — Progress Notes (Signed)
 Description   INR 2.2; Continue taking warfarin 1 tablet daily except 1/2 tablet on Monday, Wednesday and Friday. Recheck INR in 6 weeks.  Coumadin  Clinic [phone] Main [phone] Procedure Fax [phone]

## 2024-02-27 NOTE — Telephone Encounter (Signed)
**Note De-Identified Caleb Thomas Obfuscation** Letter received Corrine Tillis fax from [facility] stating that they have approved the pts CPAP Titration from 03/31/2024-06/28/2024. Reference #: [id number]  I have transferred the order to the sleep lab.  I called the pt to make him aware of this CPAP Titration PA approval and to give him the Sleep Lab's phone number so he can call them to be scheduled but I got no answer. I did leave a message on the pts VM advising him of this CPAP Titration PA approval and I provided him with the Sleep Lab's phone number and I left the office number in the message so he can call us  back if he has any questions or concerns.  I have also sent the pt a [facility] message concerning this.

## 2024-02-28 ENCOUNTER — Telehealth (HOSPITAL_BASED_OUTPATIENT_CLINIC_OR_DEPARTMENT_OTHER): Payer: Self-pay

## 2024-02-28 ENCOUNTER — Encounter: Payer: Self-pay | Admitting: Cardiology

## 2024-02-28 DIAGNOSIS — I513 Intracardiac thrombosis, not elsewhere classified: Secondary | ICD-10-CM

## 2024-02-28 DIAGNOSIS — Z5181 Encounter for therapeutic drug level monitoring: Secondary | ICD-10-CM

## 2024-02-28 NOTE — Telephone Encounter (Signed)
 Dr. Cindie  You saw this patient on 02/05/2024. Per protocol we request that you comment on his cardiac risk to proceed with colonoscopy since it has been less than 2 months since evaluated in the office. Please send your comment to P CV Pre-Op Pool.  Thank you, Lamarr Satterfield DNP, ANP, AACC.

## 2024-02-28 NOTE — Telephone Encounter (Signed)
"  ° °  Pre-operative Risk Assessment    Patient Name: Caleb Thomas  DOB: [birth year] MRN: [id number]   Date of last office visit: 02/05/2024 with Dr. Cindie Date of next office visit: 05/07/2024 with Artist Pouch, PA-C  Request for Surgical Clearance    Procedure:  Colonoscopy  Date of Surgery:  Clearance 04/05/24                                 Surgeon:  Dr. Rollin Socks Group or Practice Name:  [facility]  Phone number:  [phone] Fax number:  [phone]   Type of Clearance Requested:   - Medical  - Pharmacy:  Hold Clopidogrel  (Plavix ) and Warfarin (Coumadin ) -does not specify   Type of Anesthesia:  Propofol    Additional requests/questions:  PT HAS A BIOTRONIK ICD  Bonney Patrcia Iverson LITTIE   02/28/2024, 10:42 AM   "

## 2024-02-28 NOTE — Progress Notes (Signed)
 PERIOPERATIVE PRESCRIPTION FOR IMPLANTED CARDIAC DEVICE PROGRAMMING  Patient Information: Name:  Petar Mucci  DOB:  [birth year]  MRN:  [id number]   Procedure:  Colonoscopy   Date of Surgery:  Clearance 04/05/24                                  Surgeon:  Dr. Rollin Socks Group or Practice Name:  [facility]  Phone number:  [phone] Fax number:  [phone]  Device Information:  Clinic EP Physician: Fonda Kitty, MD  Device Type:  Defibrillator Manufacturer and Phone #:  [employer]ron[employer]: [phone] Pacemaker Dependent?:  No. Date of Last Device Check:  02/23/2024  Normal Device Function?:  Yes.    Electrophysiologist's Recommendations:  Have magnet available. Provide continuous ECG monitoring when magnet is used or reprogramming is to be performed.  Procedure may interfere with device function.  Magnet should be placed over device during procedure.  Per Device Clinic Standing Orders, Rozelle JONELLE Banter, [place]  10:50 AM 02/28/2024

## 2024-02-28 NOTE — Telephone Encounter (Signed)
 Pharmacy please advise on holding warfarin prior to colonoscopy scheduled for 04/05/2024. Thank you.

## 2024-02-28 NOTE — Telephone Encounter (Signed)
 Device Clearance complete and faxed back to [facility].

## 2024-03-04 DIAGNOSIS — K222 Esophageal obstruction: Secondary | ICD-10-CM | POA: Insufficient documentation

## 2024-03-04 DIAGNOSIS — K2100 Gastro-esophageal reflux disease with esophagitis, without bleeding: Secondary | ICD-10-CM | POA: Insufficient documentation

## 2024-03-04 DIAGNOSIS — R1310 Dysphagia, unspecified: Secondary | ICD-10-CM | POA: Insufficient documentation

## 2024-03-04 DIAGNOSIS — I499 Cardiac arrhythmia, unspecified: Secondary | ICD-10-CM | POA: Insufficient documentation

## 2024-03-04 NOTE — Telephone Encounter (Signed)
 Dr. Kennyth,   This is a patient of Dr. Cindie. Do you feel comfortable clearing this patient for a colonoscopy? Please send response to pre-op pool. Thank you.   Lamarr Satterfield, NP [facility]

## 2024-03-04 NOTE — Telephone Encounter (Signed)
 Patient with diagnosis of LV thrombus on Eliquis for anticoagulation.    Procedure: colonoscopy Date of procedure: 03/624   CrCl 79 ml/min Platelet count 236k   Per office protocol, patient can hold Eliquis for 2 days prior to procedure.    **This guidance is not considered finalized until pre-operative APP has relayed final recommendations.**

## 2024-03-13 ENCOUNTER — Other Ambulatory Visit: Payer: Self-pay | Admitting: Gastroenterology

## 2024-03-13 NOTE — Telephone Encounter (Signed)
" ° °  Name: Caleb Thomas  DOB: 11/13/1960  MRN: [id number]  Primary Cardiologist: Wilbert Bihari, MD   Preoperative team, given new mexiletine and follow-up appointment scheduled for after his procedure, please contact this patient and set up a phone call appointment for further preoperative risk assessment. Please obtain consent and complete medication review. Thank you for your help.  I confirm that guidance regarding antiplatelet and oral anticoagulation therapy has been completed and, if necessary, noted below.  Per office protocol, patient can hold Eliquis for 2 days prior to procedure. -PharmD  I also confirmed the patient resides in the state of [place] . As per [facility] Medical Board telemedicine laws, the patient must reside in the state in which the provider is licensed.   Caleb GORMAN Cleaves, NP 03/13/2024, 8:09 AM [facility]    "

## 2024-03-13 NOTE — Telephone Encounter (Signed)
 Patient scheduled for pre-op clearance on 03/28/24 with Caleb Beauvais, NP.     Patient Consent for Virtual Visit        Caleb Thomas has provided verbal consent on 03/13/2024 for a virtual visit (video or telephone).   CONSENT FOR VIRTUAL VISIT FOR:  Caleb Thomas  By participating in this virtual visit I agree to the following:  I hereby voluntarily request, consent and authorize [facility] and its employed or contracted physicians, physician assistants, nurse practitioners or other licensed health care professionals (the Practitioner), to provide me with telemedicine health care services (the Services) as deemed necessary by the treating Practitioner. I acknowledge and consent to receive the Services by the Practitioner via telemedicine. I understand that the telemedicine visit will involve communicating with the Practitioner through live audiovisual communication technology and the disclosure of certain medical information by electronic transmission. I acknowledge that I have been given the opportunity to request an in-person assessment or other available alternative prior to the telemedicine visit and am voluntarily participating in the telemedicine visit.  I understand that I have the right to withhold or withdraw my consent to the use of telemedicine in the course of my care at any time, without affecting my right to future care or treatment, and that the Practitioner or I may terminate the telemedicine visit at any time. I understand that I have the right to inspect all information obtained and/or recorded in the course of the telemedicine visit and may receive copies of available information for a reasonable fee.  I understand that some of the potential risks of receiving the Services via telemedicine include:  Delay or interruption in medical evaluation due to technological equipment failure or disruption; Information transmitted may not be sufficient (e.g. poor resolution of  images) to allow for appropriate medical decision making by the Practitioner; and/or  In rare instances, security protocols could fail, causing a breach of personal health information.  Furthermore, I acknowledge that it is my responsibility to provide information about my medical history, conditions and care that is complete and accurate to the best of my ability. I acknowledge that Practitioner's advice, recommendations, and/or decision may be based on factors not within their control, such as incomplete or inaccurate data provided by me or distortions of diagnostic images or specimens that may result from electronic transmissions. I understand that the practice of medicine is not an exact science and that Practitioner makes no warranties or guarantees regarding treatment outcomes. I acknowledge that a copy of this consent can be made available to me via my patient portal [facility] MyChart), or I can request a printed copy by calling the office of [facility].    I understand that my insurance will be billed for this visit.   I have read or had this consent read to me. I understand the contents of this consent, which adequately explains the benefits and risks of the Services being provided via telemedicine.  I have been provided ample opportunity to ask questions regarding this consent and the Services and have had my questions answered to my satisfaction. I give my informed consent for the services to be provided through the use of telemedicine in my medical care

## 2024-03-14 ENCOUNTER — Other Ambulatory Visit: Payer: Self-pay | Admitting: Cardiology

## 2024-03-15 NOTE — Telephone Encounter (Signed)
 At last Lab, creatine was outside of Normal.   In accordance with refill protocols, please review and address the following requirements before this medication refill can be authorized:  Labs

## 2024-03-18 ENCOUNTER — Telehealth: Payer: Self-pay | Admitting: Cardiology

## 2024-03-18 ENCOUNTER — Telehealth: Payer: Self-pay

## 2024-03-18 DIAGNOSIS — Z79899 Other long term (current) drug therapy: Secondary | ICD-10-CM

## 2024-03-18 DIAGNOSIS — I255 Ischemic cardiomyopathy: Secondary | ICD-10-CM

## 2024-03-18 DIAGNOSIS — I10 Essential (primary) hypertension: Secondary | ICD-10-CM

## 2024-03-18 NOTE — Telephone Encounter (Signed)
 Pt c/o medication issue:  1. Name of Medication:   sacubitril-valsartan (ENTRESTO ) 97-103 MG   2. How are you currently taking this medication (dosage and times per day)?   As prescribed  3. Are you having a reaction (difficulty breathing--STAT)?   4. What is your medication issue?   Patient called to follow-up on getting his grant for this medication renewed.

## 2024-03-18 NOTE — Telephone Encounter (Signed)
 Call to patient to advise we need lab work completed. Patient agrees to get BMET within 1 week, order placed and released. Patient states he thinks his grant 3expired which covered his entresto , he is requesting assistance with signing up for it again. Forwarded request to Rx Med assistance team.

## 2024-03-18 NOTE — Telephone Encounter (Signed)
-----   Message from Wilbert Bihari, MD sent at 03/16/2024  8:59 AM EST ----- Needs BMET

## 2024-03-19 ENCOUNTER — Telehealth: Payer: Self-pay | Admitting: Pharmacy Technician

## 2024-03-19 NOTE — Telephone Encounter (Signed)
" ° °  Patient Advocate Encounter   The patient was approved for a [employer] grant that will help cover the cost of ENTRESTO  Total amount awarded, 7500.  Effective: 02/18/24 - 02/16/25   APW:[id number] ERW:EKKEIFP Group:[id number] PI:[id number] [employer] ID: [id number]   Pharmacy provided with approval and processing information. Patient informed via [facility]    "

## 2024-03-19 NOTE — Telephone Encounter (Signed)
 Effective: 02/18/24 - 02/16/25   APW:[id number] ERW:EKKEIFP Hmnle:[id number] PI:[id number]  Lorrene renewed

## 2024-03-25 ENCOUNTER — Encounter (HOSPITAL_COMMUNITY): Payer: Self-pay | Admitting: Gastroenterology

## 2024-03-25 NOTE — Progress Notes (Signed)
 Attempted to obtain medical history for pre op call via telephone, unable to reach at this time. HIPAA compliant voicemail message left requesting return call to pre surgical testing department.

## 2024-03-28 ENCOUNTER — Ambulatory Visit: Attending: Cardiovascular Disease

## 2024-03-28 DIAGNOSIS — Z01810 Encounter for preprocedural cardiovascular examination: Secondary | ICD-10-CM | POA: Diagnosis not present

## 2024-03-28 NOTE — Telephone Encounter (Addendum)
 Patient with diagnosis of LV (left ventricular) mural thrombus on warfarin for anticoagulation.    Procedure: colonoscopy Date of procedure: 04/05/24  CrCl 79 ml/min Platelet count 236k  LV thrombus >3 months ago (~ 4 years ago). No LV thrombus on echo 02/04/21. He was previously bridged with warfarin 2 years ago. But guidelines do not recommend bridge outside of 3 months.   Per office protocol, patient can hold warfarin for 5 days prior to procedure.    Patient will not need bridging with Lovenox  (enoxaparin ) around procedure.  Confirmed with Dr. Shlomo that not bridge is needed.  **This guidance is not considered finalized until pre-operative APP has relayed final recommendations.**

## 2024-03-29 LAB — BASIC METABOLIC PANEL WITH GFR
BUN/Creatinine Ratio: 11 (ref 10–24)
BUN: 14 mg/dL (ref 8–27)
CO2: 21 mmol/L (ref 20–29)
Calcium: 8.5 mg/dL — ABNORMAL LOW (ref 8.6–10.2)
Chloride: 107 mmol/L — ABNORMAL HIGH (ref 96–106)
Creatinine, Ser: 1.29 mg/dL — ABNORMAL HIGH (ref 0.76–1.27)
Glucose: 112 mg/dL — ABNORMAL HIGH (ref 70–99)
Potassium: 3.9 mmol/L (ref 3.5–5.2)
Sodium: 145 mmol/L — ABNORMAL HIGH (ref 134–144)
eGFR: 62 mL/min/{1.73_m2}

## 2024-03-31 ENCOUNTER — Ambulatory Visit: Payer: Self-pay | Admitting: Cardiology

## 2024-04-01 ENCOUNTER — Other Ambulatory Visit (HOSPITAL_COMMUNITY): Payer: Self-pay

## 2024-04-01 LAB — CBC
Hematocrit: 57.5 % — ABNORMAL HIGH (ref 37.5–51.0)
Hemoglobin: 19.7 g/dL — ABNORMAL HIGH (ref 13.0–17.7)
MCH: 28.8 pg (ref 26.6–33.0)
MCHC: 34.3 g/dL (ref 31.5–35.7)
MCV: 84 fL (ref 79–97)
Platelets: 286 10*3/uL (ref 150–450)
RBC: 6.83 x10E6/uL — ABNORMAL HIGH (ref 4.14–5.80)
RDW: 15.4 % (ref 11.6–15.4)
WBC: 6.1 10*3/uL (ref 3.4–10.8)

## 2024-04-01 MED ORDER — ENOXAPARIN SODIUM 100 MG/ML IJ SOSY
100.0000 mg | PREFILLED_SYRINGE | Freq: Two times a day (BID) | INTRAMUSCULAR | 0 refills | Status: AC
  Filled 2024-04-01: qty 20, 10d supply, fill #0

## 2024-04-01 NOTE — Telephone Encounter (Signed)
 No the guidelines only talk about length of time since thrombus. The guidelines find, unless at very high clot risk, that the risk of bleeding with bridging is higher than the clot risk.  He started holding yesterday. I would have to get a CBC on him (last one was in Apr 2025), but he could be bridged starting tomorrow AM, or this evening if his labs came back quick.   Crcl 65   1/31: Last dose of warfarin.  2/1: No warfarin or enoxaparin  (Lovenox ).  2/2: Inject enoxaparin  100mg  in the fatty abdominal tissue at least 2 inches from the belly button twice a day about 12 hours apart, 8am and 8pm rotate sites. No warfarin.  2/3: Inject enoxaparin  in the fatty tissue every 12 hours, 8am and 8pm. No warfarin.  2/4: Inject enoxaparin  in the fatty tissue every 12 hours, 8am and 8pm. No warfarin.  2/5: Inject enoxaparin  in the fatty tissue in the morning at 8 am (No PM dose). No warfarin.  2/6: Procedure Day - No enoxaparin  - Resume warfarin in the evening or as directed by doctor (take an extra half tablet with usual dose for 2 days then resume normal dose).  2/7: Resume enoxaparin  inject in the fatty tissue every 12 hours and take warfarin  2/8: Inject enoxaparin  in the fatty tissue every 12 hours and take warfarin  2/9: Inject enoxaparin  in the fatty tissue every 12 hours and take warfarin  2/10: Inject enoxaparin  in the fatty tissue every 12 hours and take warfarin  : Inject enoxaparin  in the fatty tissue every 12 hours and take warfarin  : warfarin appt to check INR.

## 2024-04-02 ENCOUNTER — Ambulatory Visit: Payer: Self-pay | Admitting: Pharmacist

## 2024-04-03 ENCOUNTER — Ambulatory Visit: Admitting: Physician Assistant

## 2024-04-03 ENCOUNTER — Telehealth (HOSPITAL_COMMUNITY): Payer: Self-pay | Admitting: Emergency Medicine

## 2024-04-03 ENCOUNTER — Ambulatory Visit (HOSPITAL_BASED_OUTPATIENT_CLINIC_OR_DEPARTMENT_OTHER): Admitting: Cardiology

## 2024-04-03 ENCOUNTER — Telehealth: Payer: Self-pay

## 2024-04-03 VITALS — BP 126/80 | Ht 69.0 in | Wt 219.0 lb

## 2024-04-03 DIAGNOSIS — I513 Intracardiac thrombosis, not elsewhere classified: Secondary | ICD-10-CM

## 2024-04-03 DIAGNOSIS — I255 Ischemic cardiomyopathy: Secondary | ICD-10-CM | POA: Diagnosis not present

## 2024-04-03 DIAGNOSIS — I2510 Atherosclerotic heart disease of native coronary artery without angina pectoris: Secondary | ICD-10-CM

## 2024-04-03 DIAGNOSIS — Z95810 Presence of automatic (implantable) cardiac defibrillator: Secondary | ICD-10-CM

## 2024-04-03 DIAGNOSIS — I472 Ventricular tachycardia, unspecified: Secondary | ICD-10-CM

## 2024-04-03 LAB — CUP PACEART INCLINIC DEVICE CHECK
Date Time Interrogation Session: 20260204165007
Implantable Lead Connection Status: 753985
Implantable Lead Implant Date: 20201210
Implantable Lead Location: 753860
Implantable Lead Model: 436909
Implantable Lead Serial Number: 8102423
Implantable Pulse Generator Implant Date: 20201210
Pulse Gen Model: 429525
Pulse Gen Serial Number: 84745362

## 2024-04-03 MED ORDER — AMIODARONE HCL 200 MG PO TABS: 200.0000 mg | ORAL_TABLET | Freq: Every day | ORAL | 3 refills | Status: AC

## 2024-04-03 NOTE — Progress Notes (Signed)
 "   Cardiology Office Note Date:  04/03/2024  Patient ID:  Caleb Thomas, DOB 1960-10-17, MRN [id number] PCP:  Bulah Alm RAMAN, PA-C  Cardiologist:  Dr. Shlomo Electrophysiologist: Dr. Waddell    Chief Complaint:  add on for device treated VT  History of Present Illness: Caleb Thomas is a 64 y.o. male with history of  HTN, HLD CAD (04/2018 with Anterior STEMI. Cath showed severe single-vessel CAD with occluded mid LAD and faint left-to-left collaterals, medical management),  LV thrombus,  chronic CHF (systolic), ICM,  ICD  He saw D. Turner 10/06/23, doing well, mild DOE that was chronic/unchanged.  Discussed erythrocytosis on labs and planned sleep study, and further labs  He saw Dr. Cindie 02/05/24, seen because her had been shocked 01/27/24, was appropriate, the pt was asleep and unaware At this visit noted that the he had VT the day prior to that visit that he had gotten ATP for 3AM, he was unaware Noted: --- has had multiple episodes of treated ventricular arrhythmias. These are secondary to his ischemic cardiomyopathy. Given the recurrent nature of these arrhythmias, I have recommended suppression with an antiarrhythmic drug. I discussed amiodarone  and mexiletine with the patient. Given his relatively young age, favor starting with mexiletine. If he has breakthrough episodes of arrhythmias, favor adding amiodarone . Also discussed ventricular tachycardia ablation. This could also be entertained if he fails medical therapy.   He  was started on mexiletine 150mg  BID  1/29:  K+ 3.9 Creat 1.29  PENDING: colonoscopy scheduled 04/05/24 Via telephone note 03/28/24, pt without symptoms, conerns, and flet an acceptable risk for colonoscopy Rec: lovenox  while off warfarin   TODAY Device clinic add on to my schedule for treated episode on 04/02/24 Pt reported aware of the event, felt weak  He reports he did feel sudden onset of weak, knew what was happening and laid down, he does think he  passed out briefly, did not feel shock. He felt the same way back in November  He otherwise really feels very well Active, walks a mile most days either outside or on the treadmill with good exertional capacity No CP, SOB, DOE Reports good medication compliance  Is off warfarin, on lovenox  pending his colonoscopy   Device information Biotronik single chamber ICD implanted 02/07/2019 Has VDD lead  + appropriate tx. Nov and Dec 2025  Arrhythmia/AAD hx VT Mexilletine started Dec 2025   Past Medical History:  Diagnosis Date   CAD (coronary artery disease), native coronary artery 04/2018   s/p anterior STEMI with severe single-vessel CAD with occluded mid LAD and faint left-to-left collaterals.   Chronic systolic (congestive) heart failure (HCC)    Current use of long term anticoagulation    GERD (gastroesophageal reflux disease)    Hyperlipidemia    Hypertension 2015   ICD (implantable cardioverter-defibrillator) in place    Impaired fasting blood sugar    Ischemic cardiomyopathy    EF 25-30% by echo 01/2019 s/p AICD implant   LV (left ventricular) mural thrombus    noted on cardiac MRI but not on echo with contrast>>on chronic warfarin    Past Surgical History:  Procedure Laterality Date   COLONOSCOPY  2013   normal per patient, [place]   COLONOSCOPY WITH PROPOFOL  N/A 02/26/2021   Procedure: COLONOSCOPY WITH PROPOFOL ;  Surgeon: Rollin Dover, MD;  Location: [facility];  Service: Endoscopy;  Laterality: N/A;   ESOPHAGOGASTRODUODENOSCOPY (EGD) WITH PROPOFOL  N/A 02/26/2021   Procedure: ESOPHAGOGASTRODUODENOSCOPY (EGD) WITH PROPOFOL ;  Surgeon: Rollin Dover, MD;  Location:  [facility];  Service: Endoscopy;  Laterality: N/A;   ICD IMPLANT N/A 02/07/2019   Procedure: ICD IMPLANT;  Surgeon: Waddell Danelle ORN, MD;  Location: [facility] INVASIVE CV LAB;  Service: Cardiovascular;  Laterality: N/A;   LEFT HEART CATH AND CORONARY ANGIOGRAPHY N/A 05/06/2018   Procedure: LEFT HEART CATH  AND CORONARY ANGIOGRAPHY;  Surgeon: Mady Bruckner, MD;  Location: [facility] INVASIVE CV LAB;  Service: Cardiovascular;  Laterality: N/A;   POLYPECTOMY  02/26/2021   Procedure: POLYPECTOMY;  Surgeon: Rollin Dover, MD;  Location: [facility];  Service: Endoscopy;;    Current Outpatient Medications  Medication Sig Dispense Refill   atorvastatin  (LIPITOR ) 80 MG tablet Take 1 tablet by mouth once daily 90 tablet 3   clopidogrel  (PLAVIX ) 75 MG tablet Take 1 tablet by mouth once daily 90 tablet 3   enoxaparin  (LOVENOX ) 100 MG/ML injection Inject 1 mL (100 mg total) into the skin every 12 (twelve) hours. 20 mL 0   famotidine  (PEPCID ) 20 MG tablet TAKE 1 TABLET BY MOUTH ONCE OR TWICE DAILY FOR  REFLUX 180 tablet 1   JARDIANCE  10 MG TABS tablet Take 1 tablet (10 mg total) by mouth daily. 90 tablet 3   metoprolol  (TOPROL -XL) 200 MG 24 hr tablet Take 1 tablet (200 mg total) by mouth daily. (STOPPING CARVEDILOL ) 90 tablet 2   mexiletine (MEXITIL ) 150 MG capsule Take 1 capsule (150 mg total) by mouth 2 (two) times daily. 180 capsule 3   Multiple Vitamins-Minerals (CENTRUM ADULT PO) Take 1 tablet by mouth daily.     pantoprazole  (PROTONIX ) 40 MG tablet TAKE 1 TABLET BY MOUTH ONCE DAILY 45 MINUTES BEFORE BREAKFAST 90 tablet 2   sacubitril -valsartan  (ENTRESTO ) 97-103 MG Take 1 tablet by mouth twice daily 180 tablet 0   spironolactone  (ALDACTONE ) 25 MG tablet Take 1 tablet by mouth once daily 90 tablet 2   VASCEPA  1 g capsule Take 2 capsules by mouth twice daily 360 capsule 3   warfarin (COUMADIN ) 5 MG tablet TAKE 1/2 TO 1 (ONE-HALF TO ONE) TABLET BY MOUTH ONCE DAILY AS DIRECTED BY ANTICOAGULATION CLINIC. 35 tablet 5   No current facility-administered medications for this visit.    Allergies:   Patient has no known allergies.   Social History:  The patient  reports that he has quit smoking. He has never used smokeless tobacco. He reports that he does not currently use alcohol after a past usage of about 3.0  standard drinks of alcohol per week. He reports that he does not currently use drugs.   Family History:  The patient's family history includes Heart disease in his father; Hypertension in his brother and brother; Stroke in his brother and mother.  ROS:  Please see the history of present illness.    All other systems are reviewed and otherwise negative.   PHYSICAL EXAM:  VS:  There were no vitals taken for this visit. BMI: There is no height or weight on file to calculate BMI. Well nourished, well developed, in no acute distress HEENT: normocephalic, atraumatic Neck: no JVD, carotid bruits or masses Cardiac: RRR; no significant murmurs, no rubs, or gallops Lungs: CTA b/l, no wheezing, rhonchi or rales Abd: soft, nontender MS: no deformity or atrophy Ext: no edema Skin: warm and dry, no rash Neuro:  No gross deficits appreciated Psych: euthymic mood, full affect  ICD site is stable, no tethering or discomfort   EKG:  done today and reviewed by myself SR 84bpm, , LAD, no acute changes from prior  Device interrogation done today and reviewed by myself:  Battery and auto lead measurements are good 04/02/24: PMVT > VF > HV tx > success None since then    02/04/21: TTE  1. Left ventricular ejection fraction, by estimation, is 30 to 35%. The  left ventricle has moderately decreased function. There is mild left  ventricular hypertrophy of the basal-septal segment. Left ventricular  diastolic parameters are consistent with  Grade I diastolic dysfunction (impaired relaxation). There is dyskinesis  of the entire left ventricular apex. There is akinesis of the left  ventricular, mid-apical septal wall, anterior wall and inferior wall.  There is no left ventricular thrombus  (Definity  was administered).   2. Right ventricular systolic function is normal. The right ventricular  size is normal. There is mildly elevated pulmonary artery systolic  pressure. The estimated right ventricular  systolic pressure is 38.3 mmHg.   3. Left atrial size was mildly dilated.   4. The mitral valve is normal in structure. Mild to moderate mitral valve  regurgitation.   5. Tricuspid valve regurgitation is mild to moderate.   6. The aortic valve is tricuspid. There is mild calcification of the  aortic valve. There is mild thickening of the aortic valve. Aortic valve  regurgitation is not visualized. Aortic valve sclerosis is present, with  no evidence of aortic valve stenosis.   7. The inferior vena cava is normal in size with greater than 50%  respiratory variability, suggesting right atrial pressure of 3 mmHg.   Comparison(s): No significant change from prior study. Prior images  reviewed side by side.      2D echo 10/2018 IMPRESSIONS   1. The left ventricle has severely reduced systolic function, with an  ejection fraction of 25-30%. The cavity size was normal. Severe basal  septal hypertrophy. Left ventricular diastolic Doppler parameters are  consistent with pseudonormalization.   2. There is akinessis of the mid anteroseptal and inferoseptal, apical  and apical lateral walls. There is akinesis of the mid and apical  inferior, anterior and apical inferolateral walls. No evidence of apical  thrombus by definity  contrast study.   3. The right ventricle has normal systolic function. The cavity was  normal. There is no increase in right ventricular wall thickness. Right  ventricular systolic pressure is normal with an estimated pressure of 32.3  mmHg.   4. Left atrial size was mildly dilated.   5. The aortic valve is tricuspid. Moderate sclerosis of the aortic valve.  Aortic valve regurgitation is trivial by color flow Doppler.   6. The aorta is normal unless otherwise noted.    05/08/2018: c.MRI IMPRESSION: 1. Normal left ventricular size, thickness and systolic function (LVEF = 38%). There are akinesis of the mid anterior, anteroseptal, inferoseptal and apical anterior and  septal walls. There is endocardial late gadolinium enhancement in the mid anterior, anteroseptal, inferoseptal and apical anterior and septal walls with 75-100% trans-murality and signs of reperfusion. There is also severe edema in those segments. A large multilobular thrombus measuring 26 x 19 x 16 mm is present in the left ventricular apex.   2. Normal right ventricular size, thickness and systolic function (LVEF = 50%). There are no regional wall motion abnormalities.   3. Mildly dilated left atrium. Normal right atrial size.   4. Normal size of the aortic root, ascending aorta. Mildly dilated pulmonary artery measuring 34 mm.   5. Mild mitral and tricuspid regurgitation.   6. Normal pericardium.  No pericardial effusion.   A  repeat MRI or echocardiogram is recommended as significant late gadolinium enhancement might represent severe edema. An anticoagulation is recommended for the apical thrombus.    05/06/2018: [facility] Conclusions: Severe single-vessel CAD with occluded mid LAD and faint left-to-left collaterals. Severely reduced LVEF with anterior and apical akinesis; question LV apical thrombus. Mildly elevated left ventricular filling pressure (LVEDP 15-20 mmHg).   Recommendations: Given onset of symptoms 3-4 days ago and lack of ongoing chest pain, I recommend medical therapy, including 12 months of dual antiplatelet therapy with aspirin  and clopidogrel . Obtain echo with Definity  to assess for LV apical thrombus.  I will restart heparin  infusion 2 hours after TR band deflation pending echo results. Aggressive secondary prevention. Given mildly elevated LVEDP and acute kidney injury, maintain net even fluid balance.  May need to consider gentle diuresis if dyspnea develops.     Recent Labs: 06/22/2023: ALT 42 02/05/2024: Magnesium  2.1 03/28/2024: BUN 14; Creatinine, Ser 1.29; Potassium 3.9; Sodium 145 04/01/2024: Hemoglobin 19.7; Platelets 286  06/22/2023: Chol/HDL Ratio 4.0;  Cholesterol, Total 108; HDL 27; LDL Chol Calc (NIH) 56; Triglycerides 143   Estimated Creatinine Clearance: 64.8 mL/min (A) (by C-G formula based on SCr of 1.29 mg/dL (H)).   Wt Readings from Last 3 Encounters:  02/05/24 216 lb (98 kg)  10/06/23 214 lb (97.1 kg)  06/22/23 205 lb 9.6 oz (93.3 kg)     Other studies reviewed: Additional studies/records reviewed today include: summarized above  ASSESSMENT AND PLAN:  1. ICD     Intact function     no programming changes made  2. CAD     No anginal symptoms     On plavix , BB, statin     C/w Dr. Asberry  3. ICM 4. Chronic CHF (systolic)     No symptoms or exam findings of volume OL     C/w Dr. Asberry   5. Hx of LV thrombus     On warfarin, Monitored and managed here   6.  VT Clear up tick in arrhythmia burden of late + symptomatic, syncope Prior episodes reviewed March 2025, Nov 2025 and Dec 2025, some as well PMVT  Reviewed with Dr. Court, DOD Given no anginal symptoms and known ICM, akinetic WMA on his echos, likely scar and no need for new ischemic evaluation Agreed, should not have colonoscopy until arrhythmia burden is improved Agreed with amiodarone  To see his new EP MD  Plan Labs today Amiodarone  400mg  BID x10 days > 200mg  BID x 10 days > 200mg  daily  He is aware to cancel his colonoscopy In d/w [facility]/coumadin  clinic Resume warfarin today, continue lovenox  until his CC visit already scheduled for 2/10  Resume Plavix  Update echo No driving 6 months, he was aware  He has been assigned to Dr. Kennyth in [place]  Disposition:  will have him back in 1 mo to see Dr. Kennyth, sooner if needed.   Current medicines are reviewed at length with the patient today.  The patient did not have any concerns regarding medicines.  Bonney Charlies Arthur, PA-C 04/03/2024 12:59 PM     [facility] [address] [place] [address] [phone] (office)  [phone] (fax)   "

## 2024-04-03 NOTE — Telephone Encounter (Signed)
 Alert received for VF episode treated with shock.

## 2024-04-03 NOTE — Telephone Encounter (Signed)
 Call to patient to review labs, patient states he does have a hard time getting enough water during the day. Discussed to drink water with every meal and to do a water chaser with any coffee, tea, or soda. Patient verbalizes understanding and asks if he needs to repeat labs. Fowarded to Dr. Shlomo for recommendations.

## 2024-04-03 NOTE — Telephone Encounter (Signed)
 Left message requesting call back.  Will attempt to schedule with EP APP today at 2:45 pm.

## 2024-04-03 NOTE — Telephone Encounter (Signed)
 Caleb Thomas was scheduled for colonoscopy (Procedure) with Dr. rollin on 04/05/24 (date), at [facility].   Patient/or family called on 04/03/24 (date) to cancel their procedure due to defibrillator issues (reason)  Patient instructed to call physician's office to reschedule their procedure. Patient demonstrated understanding.

## 2024-04-03 NOTE — Telephone Encounter (Signed)
-----   Message from Wilbert Bihari, MD sent at 03/31/2024  9:33 PM EST ----- Renal function slightly elevated.  Na and Cl mildly elevated - please find out how much fluids patient is drinking during the day as Jardiance  can cause dehydration and elevated Na and Cl

## 2024-04-03 NOTE — Patient Instructions (Addendum)
 Medication Instructions:   START  TAKING:  AMIODARONE   200 MG  TAKE AS PRESCRIBED BELOW :  TAKE FOR 10 DAYS ONLY :   400 MG AM  TWICE  A  DAY   2.   TAKE  FOR 10 DAYS ONLY  :   200 MG  TWICE  A  DAY    3.   RESUME TAKING  200 MG ONCE A   DAY     RESUME  BACK TAKING  PLAVIX  AND  WAFARIN  WITH  LOVENOX   AS  NORMAL   DO NOT HOLD   AT  THIS TIME  AND FOLLOW UP WITH COUMADIN  CLINIC    *If you need a refill on your cardiac medications before your next appointment, please call your pharmacy*    Lab Work:  PLEASE GO DOWN STAIRS  [facility]  FIRST FLOOR   ( GET OFF ELEVATORS WALK TOWARDS WAITING AREA LAB LOCATED BY PHARMACY):  CMET  MAG AND THYROID  PANEL      If you have labs (blood work) drawn today and your tests are completely normal, you will receive your results only by: MyChart Message (if you have MyChart) OR A paper copy in the mail If you have any lab test that is abnormal or we need to change your treatment, we will call you to review the results.   Testing/Procedures: Your physician has requested that you have an echocardiogram. Echocardiography is a painless test that uses sound waves to create images of your heart. It provides your doctor with information about the size and shape of your heart and how well your hearts chambers and valves are working. This procedure takes approximately one hour. There are no restrictions for this procedure. Please do NOT wear cologne, perfume, aftershave, or lotions (deodorant is allowed). Please arrive 15 minutes prior to your appointment time.  Please note: We ask at that you not bring children with you during ultrasound (echo/ vascular) testing. Due to room size and safety concerns, children are not allowed in the ultrasound rooms during exams. Our front office staff cannot provide observation of children in our lobby area while testing is being conducted. An adult accompanying a patient to their appointment will only be allowed in the  ultrasound room at the discretion of the ultrasound technician under special circumstances. We apologize for any inconvenience.   Follow-Up: At [facility], you and your health needs are our priority.  As part of our continuing mission to provide you with exceptional heart care, our providers are all part of one team.  This team includes your primary Cardiologist (physician) and Advanced Practice Providers or APPs (Physician Assistants and Nurse Practitioners) who all work together to provide you with the care you need, when you need it.  Your next appointment:  NEW PATIENT   6 -8  week(s)   Provider:  Fonda Kitty, MD  ( CONTACT  CASSIE HALL/ ANGELINE HAMMER FOR EP SCHEDULING ISSUES )   We recommend signing up for the patient portal called MyChart.  Sign up information is provided on this After Visit Summary.  MyChart is used to connect with patients for Virtual Visits (Telemedicine).  Patients are able to view lab/test results, encounter notes, upcoming appointments, etc.  Non-urgent messages can be sent to your provider as well.   To learn more about what you can do with MyChart, go to [email or web address].   Other Instructions

## 2024-04-03 NOTE — Telephone Encounter (Signed)
 Spoke with Pt.  Per Pt he remembers this episode.  He thinks it was because he was really hungry and his body just weakened.  Pt agreeable to appointment this afternoon for evaluation.  Pt with history of VT on mexiletine.  Per last OV note-would add amiodarone  if further VT.  Pt advised not to drive.  He will have someone bring him to appointment this afternoon at 2:45 pm.  Pt scheduled for colonoscopy on 04/05/2024.

## 2024-04-04 ENCOUNTER — Other Ambulatory Visit: Payer: Self-pay

## 2024-04-04 ENCOUNTER — Ambulatory Visit: Payer: Self-pay | Admitting: Physician Assistant

## 2024-04-04 ENCOUNTER — Emergency Department (HOSPITAL_COMMUNITY)

## 2024-04-04 ENCOUNTER — Telehealth: Payer: Self-pay

## 2024-04-04 ENCOUNTER — Inpatient Hospital Stay (HOSPITAL_COMMUNITY): Admission: EM | Admit: 2024-04-04 | Source: Home / Self Care | Admitting: Cardiology

## 2024-04-04 ENCOUNTER — Encounter (HOSPITAL_COMMUNITY): Payer: Self-pay

## 2024-04-04 DIAGNOSIS — Z4502 Encounter for adjustment and management of automatic implantable cardiac defibrillator: Secondary | ICD-10-CM

## 2024-04-04 DIAGNOSIS — I472 Ventricular tachycardia, unspecified: Principal | ICD-10-CM | POA: Diagnosis present

## 2024-04-04 LAB — THYROID PANEL WITH TSH
Free Thyroxine Index: 3.1 (ref 1.2–4.9)
T3 Uptake Ratio: 33 % (ref 24–39)
T4, Total: 9.3 ug/dL (ref 4.5–12.0)
TSH: 2.52 u[IU]/mL (ref 0.450–4.500)

## 2024-04-04 LAB — TROPONIN T, HIGH SENSITIVITY
Troponin T High Sensitivity: 20 ng/L — ABNORMAL HIGH (ref 0–19)
Troponin T High Sensitivity: 18 ng/L (ref 0–19)

## 2024-04-04 LAB — COMPREHENSIVE METABOLIC PANEL WITH GFR
ALT: 37 [IU]/L (ref 0–44)
AST: 27 [IU]/L (ref 0–40)
Albumin: 3.7 g/dL — ABNORMAL LOW (ref 3.9–4.9)
Alkaline Phosphatase: 74 [IU]/L (ref 47–123)
BUN/Creatinine Ratio: 13 (ref 10–24)
BUN: 17 mg/dL (ref 8–27)
Bilirubin Total: 0.6 mg/dL (ref 0.0–1.2)
CO2: 20 mmol/L (ref 20–29)
Calcium: 8.9 mg/dL (ref 8.6–10.2)
Chloride: 107 mmol/L — ABNORMAL HIGH (ref 96–106)
Creatinine, Ser: 1.33 mg/dL — ABNORMAL HIGH (ref 0.76–1.27)
Globulin, Total: 3.3 g/dL (ref 1.5–4.5)
Glucose: 88 mg/dL (ref 70–99)
Potassium: 4.1 mmol/L (ref 3.5–5.2)
Sodium: 142 mmol/L (ref 134–144)
Total Protein: 7 g/dL (ref 6.0–8.5)
eGFR: 59 mL/min/{1.73_m2} — ABNORMAL LOW

## 2024-04-04 LAB — BASIC METABOLIC PANEL WITH GFR
Anion gap: 11 (ref 5–15)
BUN: 13 mg/dL (ref 8–23)
CO2: 22 mmol/L (ref 22–32)
Calcium: 8.8 mg/dL — ABNORMAL LOW (ref 8.9–10.3)
Chloride: 107 mmol/L (ref 98–111)
Creatinine, Ser: 1.3 mg/dL — ABNORMAL HIGH (ref 0.61–1.24)
GFR, Estimated: >60 mL/min
Glucose, Bld: 121 mg/dL — ABNORMAL HIGH (ref 70–99)
Potassium: 4 mmol/L (ref 3.5–5.1)
Sodium: 140 mmol/L (ref 135–145)

## 2024-04-04 LAB — CBC
HCT: 51.2 % (ref 39.0–52.0)
Hemoglobin: 18.9 g/dL — ABNORMAL HIGH (ref 13.0–17.0)
MCH: 30 pg (ref 26.0–34.0)
MCHC: 36.9 g/dL — ABNORMAL HIGH (ref 30.0–36.0)
MCV: 81.4 fL (ref 80.0–100.0)
Platelets: 251 10*3/uL (ref 150–400)
RBC: 6.29 MIL/uL — ABNORMAL HIGH (ref 4.22–5.81)
RDW: 14.6 % (ref 11.5–15.5)
WBC: 5.7 10*3/uL (ref 4.0–10.5)
nRBC: 0 % (ref 0.0–0.2)

## 2024-04-04 LAB — PROTIME-INR
INR: 1.1 (ref 0.8–1.2)
Prothrombin Time: 14.9 s (ref 11.4–15.2)

## 2024-04-04 LAB — MAGNESIUM: Magnesium: 2 mg/dL (ref 1.6–2.3)

## 2024-04-04 MED ORDER — MEXILETINE HCL 150 MG PO CAPS
150.0000 mg | ORAL_CAPSULE | Freq: Two times a day (BID) | ORAL | Status: AC
  Administered 2024-04-04 – 2024-04-05 (×3): 150 mg via ORAL
  Filled 2024-04-04 (×3): qty 1

## 2024-04-04 MED ORDER — WARFARIN - PHARMACIST DOSING INPATIENT: Freq: Every day | Status: AC

## 2024-04-04 MED ORDER — AMIODARONE HCL IN DEXTROSE 360-4.14 MG/200ML-% IV SOLN
60.0000 mg/h | INTRAVENOUS | Status: AC
  Administered 2024-04-04 (×2): 60 mg/h via INTRAVENOUS
  Filled 2024-04-04 (×2): qty 200

## 2024-04-04 MED ORDER — AMIODARONE HCL IN DEXTROSE 360-4.14 MG/200ML-% IV SOLN
30.0000 mg/h | INTRAVENOUS | Status: DC
  Administered 2024-04-04 – 2024-04-05 (×3): 30 mg/h via INTRAVENOUS
  Filled 2024-04-04 (×3): qty 200

## 2024-04-04 MED ORDER — WARFARIN SODIUM 5 MG PO TABS
5.0000 mg | ORAL_TABLET | Freq: Once | ORAL | Status: AC
  Administered 2024-04-04: 5 mg via ORAL
  Filled 2024-04-04: qty 1

## 2024-04-04 MED ORDER — AMIODARONE LOAD VIA INFUSION
150.0000 mg | Freq: Once | INTRAVENOUS | Status: AC
  Administered 2024-04-04: 150 mg via INTRAVENOUS
  Filled 2024-04-04: qty 83.34

## 2024-04-04 MED ORDER — ENOXAPARIN SODIUM 100 MG/ML IJ SOSY
1.0000 mg/kg | PREFILLED_SYRINGE | Freq: Two times a day (BID) | INTRAMUSCULAR | Status: AC
  Administered 2024-04-04 – 2024-04-05 (×3): 100 mg via SUBCUTANEOUS
  Filled 2024-04-04 (×3): qty 1

## 2024-04-04 NOTE — H&P (Addendum)
 "   ELECTROPHYSIOLOGY H&P NOTE    Patient ID: Caleb Thomas MRN: [id number], DOB/AGE: [birth year] 64 y.o.  Admit date: 04/04/2024 Date of Consult: 04/04/2024  Primary Physician: Bulah Alm RAMAN, PA-C Primary Cardiologist: Wilbert Bihari, MD  Electrophysiologist: Dr. Kennyth   Reason for Admission: Ventricular arrhythmia, ICD shocks  Patient Profile: Caleb Thomas is a 64 y.o. male with a history of HTN, HLD, CAD (04/2018 with Anterior STEMI. Cath showed severe single-vessel CAD with occluded mid LAD and faint left-to-left collaterals, medical management), LV thrombus, chronic CHF (systolic), ICM, ICD who is being seen today for the evaluation of VT with ICD shock at the request of Dr. Ruthe.  HPI:  Caleb Thomas is a 64 y.o. male well known and followed by EP clinic.   e saw D. Turner 10/06/23, doing well, mild DOE that was chronic/unchanged.  Discussed erythrocytosis on labs and planned sleep study, and further labs   He saw Dr. Cindie 02/05/24, seen because her had been shocked 01/27/24, was appropriate, the pt was asleep and unaware At this visit noted that the he had VT the day prior to that visit that he had gotten ATP for 3AM, he was unaware - Given young age, had hoped to avoid amiodarone  and started Mexitil .   Seen in EP clinic 2/4 for PMVT episode 2/3 with ICD shock. Pt reported sudden onset weakness, laid down, and had brief LOC but denies feeling the shock. Long discussion about AAD and started on amiodarone .  Plans for colonoscopy deferred.   Device clinic received subsequent alert today for further ICD shock, pt symptomatic with striking sensation.  Instructed to come to ED for IV amiodarone  loading.   Currently, pt is asymptomatic at rest. Denies chest pain. Taking all medications as directed, had not yet started amiodarone .  Reports USOH and pretty active. Has chronic, mild dyspnea with more than moderate exertion. Denies edema or recent illness.   Labs Potassium4.0 (02/05  1517) Magnesium   2.0 (02/04 1611) Creatinine, ser  1.30* (02/05 1517) PLT  251 (02/05 1517) HGB  18.9* (02/05 1517) WBC 5.7 (02/05 1517)  .     Allergies, Medical, Surgical, Social, and Family Histories have been reviewed and are referenced here-in when relevant for medical decision making.    Physical Exam: Vitals:   04/04/24 1453 04/04/24 1509  BP: (!) 138/93   Pulse: 90   Resp: 18   Temp: (!) 97.5 F (36.4 C)   TempSrc: Oral   SpO2: 94%   Weight:  99.3 kg  Height:  5' 9 (1.753 m)    GEN- NAD, A&O x 3, normal affect HEENT: Normocephalic, atraumatic Lungs- CTAB, Normal effort.  Heart- Regular rate and rhythm, No M/G/R.  GI- Soft, NT, ND.  Extremities- No clubbing, cyanosis, or edema   Radiology/Studies: CUP PACEART INCLINIC DEVICE CHECK Result Date: 04/03/2024 Normal in-clinic _single (VDD)__ chamber ICD check. Presenting Rhythm: __AS/VS, occ PVCs_ . Auto thresholds, sensing, and impedance demonstrate stable parameters and no programming changes needed.  Estimated longevity _MOS1___ . Pt enrolled in remote follow-up. pt with known (via clinic alert) treated VT episode EGM c/w PMVT treated successfully with HV therapy x1 see office note for full discussion on arrhythmia and management RU   ZXH:unijb with NSR and multifocal PVCs at 95 bpm (personally reviewed)  TELEMETRY: NSR 80-90s with frequent, multifocal PVCs (personally reviewed)  Arrhythmia/Device History Biotronik single chamber ICD implanted 02/07/2019 Has VDD lead   + appropriate tx. Nov and Dec 2025, 03/2024 Started on Mexitil  01/2024  Assessment/Plan:  Recurrent ventricular arrhythmia  ICD shocks Chronic systolic CHF Echo 01/2021 LVEF 30-35% -> update. Continue GDMT as tolerated Potassium4.0 (02/05 1517) Magnesium   2.0 (02/04 1611) Creatinine, ser  1.30* (02/05 1517) Keep K > 4.0 and Mg > 2.0  Update labs today.  Load IV amiodarone .  Continue mexitil  for now, can continue stopping once amio loaded.    CAD No s/s of ischemia.     Case reviewed with MD in office yesterday and not felt to need ischemic work up at this time with no anginal symptoms, known ICM, and akinetic WMA on his echos (unlikely to benefit from revasc)   H/o LV thrombus Was bridging with heparin  for colonoscopy Warfarin dosing +/- bridge per pharmacy.   For questions or updates, please contact [facility] Please consult [email or web address] for contact info under     Signed, Ozell Prentice Passey, PA-C  04/04/2024, 3:59 PM    I have seen, examined the patient, and reviewed the above assessment and plan.    HPI: [name] is a 64 y.o. male with a history of HTN, HLD, CAD (04/2018) with anterior STEMI, occluded mid LAD and faint left-to-left collaterals, medical management), LV thrombus, chronic CHF (systolic), ICM, ICD who presented to the ED following ICD shock.  He had a shock on 2/3 in the setting of PM VT then again in the past 24 hours.  Patient reports that he has been feeling very well at baseline.  No recent illness.  He has been compliant with medications.  No worsening shortness of breath different from his baseline.  No orthopnea, chest pain or PND.  General: Well developed, in no acute distress.  Neck: No JVD.  Cardiac: Normal rate, regular rhythm.  Resp: Normal work of breathing.  Ext: No edema.  Neuro: No gross focal deficits.  Psych: Normal affect.   Assessment: [name] is a 64 year old gentleman with chronic systolic heart failure secondary to ischemic cardiomyopathy who presents with ventricular tachycardia and ICD shock.  Suspect that his VT is scar mediated.  There were no signs or symptoms of ischemia.  He does not appear to be in clinical heart failure.  Electrolytes were normal.  Problem List:  #Ventricular tachycardia  #ICD chock  #Chronic systolic heart failure #Ischemic cardiomyopathy  Plan: - IV amiodarone  load for 24 hours.  - Update echocardiogram.  - Continue mexiletine  and metoprolol .  - Continue home GDMT.   Fonda Kitty, MD 04/04/2024 9:35 PM   "

## 2024-04-04 NOTE — Consult Note (Deleted)
 "   ELECTROPHYSIOLOGY CONSULT NOTE    Patient ID: Caleb Thomas MRN: [id number], DOB/AGE: 1960-08-03 64 y.o.  Admit date: 04/04/2024 Date of Consult: 04/04/2024  Primary Physician: Bulah Alm RAMAN, PA-C Primary Cardiologist: Wilbert Bihari, MD  Electrophysiologist: Dr. Kennyth   Reason for Admission: Ventricular arrhythmia, ICD shocks  Patient Profile: Caleb Thomas is a 64 y.o. male with a history of HTN, HLD, CAD (04/2018 with Anterior STEMI. Cath showed severe single-vessel CAD with occluded mid LAD and faint left-to-left collaterals, medical management), LV thrombus, chronic CHF (systolic), ICM, ICD who is being seen today for the evaluation of VT with ICD shock at the request of Dr. Ruthe.  HPI:  Caleb Thomas is a 64 y.o. male well known and followed by EP clinic.   e saw D. Turner 10/06/23, doing well, mild DOE that was chronic/unchanged.  Discussed erythrocytosis on labs and planned sleep study, and further labs   He saw Dr. Cindie 02/05/24, seen because her had been shocked 01/27/24, was appropriate, the pt was asleep and unaware At this visit noted that the he had VT the day prior to that visit that he had gotten ATP for 3AM, he was unaware - Given young age, had hoped to avoid amiodarone  and started Mexitil .   Seen in EP clinic 2/4 for PMVT episode 2/3 with ICD shock. Pt reported sudden onset weakness, laid down, and had brief LOC but denies feeling the shock. Long discussion about AAD and started on amiodarone .  Plans for colonoscopy deferred.   Device clinic received subsequent alert today for further ICD shock, pt symptomatic with striking sensation.  Instructed to come to ED for IV amiodarone  loading.   Currently, pt is asymptomatic at rest. Denies chest pain. Taking all medications as directed, had not yet started amiodarone .  Reports USOH and pretty active. Has chronic, mild dyspnea with more than moderate exertion. Denies edema or recent illness.   Labs Potassium4.1 (02/04  1611) Magnesium   2.0 (02/04 1611) Creatinine, ser  1.33* (02/04 1611) PLT  251 (02/05 1517) HGB  18.9* (02/05 1517) WBC 5.7 (02/05 1517)  .     Allergies, Medical, Surgical, Social, and Family Histories have been reviewed and are referenced here-in when relevant for medical decision making.    Physical Exam: Vitals:   04/04/24 1453 04/04/24 1509  BP: (!) 138/93   Pulse: 90   Resp: 18   Temp: (!) 97.5 F (36.4 C)   TempSrc: Oral   SpO2: 94%   Weight:  99.3 kg  Height:  5' 9 (1.753 m)    GEN- NAD, A&O x 3, normal affect HEENT: Normocephalic, atraumatic Lungs- CTAB, Normal effort.  Heart- Regular rate and rhythm, No M/G/R.  GI- Soft, NT, ND.  Extremities- No clubbing, cyanosis, or edema   Radiology/Studies: CUP PACEART INCLINIC DEVICE CHECK Result Date: 04/03/2024 Normal in-clinic _single (VDD)__ chamber ICD check. Presenting Rhythm: __AS/VS, occ PVCs_ . Auto thresholds, sensing, and impedance demonstrate stable parameters and no programming changes needed.  Estimated longevity _MOS1___ . Pt enrolled in remote follow-up. pt with known (via clinic alert) treated VT episode EGM c/w PMVT treated successfully with HV therapy x1 see office note for full discussion on arrhythmia and management RU   ZXH:unijb with NSR and multifocal PVCs at 95 bpm (personally reviewed)  TELEMETRY: NSR 80-90s with frequent, multifocal PVCs (personally reviewed)  Arrhythmia/Device History Biotronik single chamber ICD implanted 02/07/2019 Has VDD lead   + appropriate tx. Nov and Dec 2025, 03/2024 Started on Mexitil  01/2024  Assessment/Plan:  Recurrent ventricular arrhythmia  ICD shocks Chronic systolic CHF Echo 01/2021 LVEF 30-35% -> update. Continue GDMT as tolerated Potassium4.1 (02/04 1611) Magnesium   2.0 (02/04 1611) Creatinine, ser  1.33* (02/04 1611) Keep K > 4.0 and Mg > 2.0  Update labs today.  Load IV amiodarone .  Continue mexitil  for now, can continue stopping once amio loaded.    CAD No s/s of ischemia.     Case reviewed with MD in office yesterday and not felt to need ischemic work up at this time with no anginal symptoms, known ICM, and akinetic WMA on his echos (unlikely to benefit from revasc)   H/o LV thrombus Was bridging with heparin  for colonoscopy Warfarin dosing +/- bridge per pharmacy.   For questions or updates, please contact [facility] Please consult [email or web address] for contact info under     Signed, Ozell Prentice Passey, PA-C  04/04/2024, 3:44 PM        "

## 2024-04-04 NOTE — Telephone Encounter (Signed)
 Spoke to patient who writer advised he needs to go to ER per [place], [place]. Patient voiced understanding and agreeable to plan.

## 2024-04-04 NOTE — ED Provider Notes (Addendum)
 Supervised resident visit.  Patient here for admission to cardiology for IV amiodarone  infusion/bolus for V. tach.  ICD fired overnight they instructed him to come in.  He has been doing amiodarone  load at home but may be having a hard time knowing correct doses to take.  He is not having any symptoms now.  Lab work is unremarkable.  EKG shows sinus rhythm with no ischemic changes.  Patient to be admitted to cardiology team for further care.  I reviewed interpreted labs and imaging.  Asymptomatic throughout my care.  .Critical Care  Performed by: Ruthe Cornet, DO Authorized by: Ruthe Cornet, DO   Critical care provider statement:    Critical care time (minutes):  35   Critical care was necessary to treat or prevent imminent or life-threatening deterioration of the following conditions: vtach, iv amioadorone.   Critical care was time spent personally by me on the following activities:  Blood draw for specimens, development of treatment plan with patient or surrogate, discussions with consultants, evaluation of patient's response to treatment, examination of patient, obtaining history from patient or surrogate, ordering and performing treatments and interventions, ordering and review of laboratory studies, ordering and review of radiographic studies, pulse oximetry, re-evaluation of patient's condition and review of old charts   Care discussed with: admitting provider     This chart was dictated using voice recognition software.  Despite best efforts to proofread,  errors can occur which can change the documentation meaning.    Ruthe Cornet, DO 04/04/24 1600    Ruthe Cornet, DO 04/04/24 1601

## 2024-04-04 NOTE — ED Notes (Signed)
 CCMD called and verified patient on cardiac telemetry

## 2024-04-04 NOTE — Telephone Encounter (Signed)
 Biotronik alert received for 1 shock 04/04/24 @ 01:44 AM. Patient reports he was alseep and woke up feeling like something hit him in the mouth. Patient does any chest pain, palpitations, shortness of breath or any other cardiac symptoms. Reports feeling great this morning.   Pt is already under driving restrictions x6 months. Was seen in office by [facility] 04/03/24. AMIO was added with tappered dose.   Routing to [place] to update.

## 2024-04-04 NOTE — Telephone Encounter (Signed)
 Spoke with Charlies Arthur -PA-C.  Patient needs to go to the [facility] ER now to receive IV amiodarone  and urgent treatment for his arrhythmias as events are escalating.   Patient says wife is at work, explained this is urgent and he needs to go ASAP either for wife to come and take him now or for him to call EMS for transport to [facility] ER but he is NOT to drive.  Patient verbalizes understanding and reports he will be on his way as soon as possible and agrees not to drive. He is still reporting that he is asymptomatic.

## 2024-04-04 NOTE — ED Triage Notes (Signed)
 BIB spouse from home for AICD fired last night while sleeping. Reports felt it into my jaw. Fired once. Pt received call from cardiologist that instructed him to go to ED. Pt denies pain, sob, NV, dizziness, diaphoresis, syncope or other sx. Denies CP. Pt alert, NAD, calm, interactive, resps e/u, speaking in clear complete sentences. Steady gait.

## 2024-04-04 NOTE — Progress Notes (Signed)
 ANTICOAGULATION CONSULT NOTE  Pharmacy Consult for Warfarin Indication: LV Thrombus  Allergies[1]  Patient Measurements: Height: 5' 9 (175.3 cm) Weight: 99.3 kg (219 lb) IBW/kg (Calculated) : 70.7  Vital Signs: Temp: 97.5 F (36.4 C) (02/05 1453) Temp Source: Oral (02/05 1453) BP: 138/93 (02/05 1453) Pulse Rate: 90 (02/05 1453)  Labs: Recent Labs    04/03/24 1611 04/04/24 1517  HGB  --  18.9*  HCT  --  51.2  PLT  --  251  CREATININE 1.33* 1.30*    Estimated Creatinine Clearance: 65.8 mL/min (A) (by C-G formula based on SCr of 1.3 mg/dL (H)).   Medical History: Past Medical History:  Diagnosis Date   CAD (coronary artery disease), native coronary artery 04/2018   s/p anterior STEMI with severe single-vessel CAD with occluded mid LAD and faint left-to-left collaterals.   Chronic systolic (congestive) heart failure (HCC)    Current use of long term anticoagulation    GERD (gastroesophageal reflux disease)    Hyperlipidemia    Hypertension 2015   ICD (implantable cardioverter-defibrillator) in place    Impaired fasting blood sugar    Ischemic cardiomyopathy    EF 25-30% by echo 01/2019 s/p AICD implant   LV (left ventricular) mural thrombus    noted on cardiac MRI but not on echo with contrast>>on chronic warfarin    Medications:  (Not in a hospital admission)  Scheduled:   amiodarone   150 mg Intravenous Once   Infusions:   amiodarone      Followed by   amiodarone      PRN:   Assessment: 63 yom with a history of HTN, HLD, CAD, STEMI, LV Thrombus, HF. Patient is presenting with VT. Warfarin per pharmacy consult placed for LV Thrombus.  Patient was initially being bridged with enoxaparin  for an upcoming procedure. This was cancelled and warfarin resumed 2/4 outpatient. Home dose is 5mg  TuTh,Sa,Su; 5mg  MWF. Last taken 2/4 1830.  Patient's last enoxaparin  dose (100mg  q12h) was 2/5 0830.  PT / INR today is 14.9 / 1.1, which is sub-therapeutic Hgb 18.9;  plt 251  Goal of Therapy:  INR Goal 2-3 Monitor platelets by anticoagulation protocol: Yes   Plan:  Warfarin: 5mg  warfarin today - repeat doses per INR Monitor for s/s of hemorrhage, daily INR, CBC Watch for new DDIs  Continue enoxaparin  100 mg q12h until INR therapeutic Monitor renal function  Dorn Buttner, PharmD, BCPS 04/04/2024 4:15 PM ED Clinical Pharmacist -  [phone]        [1] No Known Allergies

## 2024-04-04 NOTE — Telephone Encounter (Signed)
 Spoke to patient  Patient mistook 200 mg table  suppose to be a 400 mg tablet. Patient has clarification  of 200 mg tablet appropriate dose for instructions to take medicine.

## 2024-04-04 NOTE — ED Provider Notes (Signed)
 " [place] [facility] AT [facility] Provider Note   CSN: [id number] Arrival date & time: 04/04/24  1450     Patient presents with: AICD Problem   Caleb Thomas is a 64 y.o. male with past medical history notable for CAD status post STEMI, HFrEF s/p Biotronik ICD, hypertension, hyperlipidemia, LV thrombus who presents today for evaluation of ICD firing.  Patient reports that he has been in his normal state of health.  Was seen by cardiology today at which time he had device interrogation that showed an increase in frequency of the amount of ventricular arrhythmias.  Patient has been asymptomatic with this and denies any episodes of chest pain or syncope.  Recommendations for increasing outpatient amiodarone .  Patient reports that yesterday evening when he was getting ready for bed he felt his device fired.  He only accounts for 1 shock and denies any additional chest pain, lightheadedness, syncope.  Was instructed to come in today for evaluation by his cardiologist.  HPI     Prior to Admission medications  Medication Sig Start Date End Date Taking? Authorizing Provider  amiodarone  (PACERONE ) 200 MG tablet Take 1 tablet (200 mg total) by mouth daily. 04/03/24   Ursuy, Renee Lynn, PA-C  atorvastatin  (LIPITOR ) 80 MG tablet Take 1 tablet by mouth once daily 10/23/23   Shlomo Wilbert SAUNDERS, MD  clopidogrel  (PLAVIX ) 75 MG tablet Take 1 tablet by mouth once daily 11/08/23   Shlomo Wilbert SAUNDERS, MD  enoxaparin  (LOVENOX ) 100 MG/ML injection Inject 1 mL (100 mg total) into the skin every 12 (twelve) hours. 04/01/24   Maccia, Melissa D, RPH-CPP  famotidine  (PEPCID ) 20 MG tablet TAKE 1 TABLET BY MOUTH ONCE OR TWICE DAILY FOR  REFLUX 01/17/24   Tysinger, Alm RAMAN, PA-C  JARDIANCE  10 MG TABS tablet Take 1 tablet (10 mg total) by mouth daily. 11/08/23   Shlomo Wilbert SAUNDERS, MD  metoprolol  (TOPROL -XL) 200 MG 24 hr tablet Take 1 tablet (200 mg total) by mouth daily. (STOPPING CARVEDILOL ) 01/15/24   Shlomo Wilbert SAUNDERS, MD  mexiletine (MEXITIL ) 150 MG capsule Take 1 capsule (150 mg total) by mouth 2 (two) times daily. 02/05/24   Cindie Ole DASEN, MD  Multiple Vitamins-Minerals (CENTRUM ADULT PO) Take 1 tablet by mouth daily.    [provider]  pantoprazole  (PROTONIX ) 40 MG tablet TAKE 1 TABLET BY MOUTH ONCE DAILY 45 MINUTES BEFORE BREAKFAST 09/07/23   Tysinger, Alm RAMAN, PA-C  sacubitril -valsartan  (ENTRESTO ) 97-103 MG Take 1 tablet by mouth twice daily 03/16/24   Shlomo Wilbert SAUNDERS, MD  spironolactone  (ALDACTONE ) 25 MG tablet Take 1 tablet by mouth once daily 01/15/24   Shlomo Wilbert SAUNDERS, MD  VASCEPA  1 g capsule Take 2 capsules by mouth twice daily 02/09/24   Shlomo Wilbert SAUNDERS, MD  warfarin (COUMADIN ) 5 MG tablet TAKE 1/2 TO 1 (ONE-HALF TO ONE) TABLET BY MOUTH ONCE DAILY AS DIRECTED BY ANTICOAGULATION CLINIC. 11/22/23   Shlomo Wilbert SAUNDERS, MD    Allergies: Patient has no known allergies.    Review of Systems  Updated Vital Signs BP (!) 138/93   Pulse 90   Temp (!) 97.5 F (36.4 C) (Oral)   Resp 18   Ht 5' 9 (1.753 m)   Wt 99.3 kg   SpO2 94%   BMI 32.34 kg/m   Physical Exam Constitutional:      General: He is not in acute distress.    Appearance: Normal appearance.  HENT:     Head: Normocephalic.  Right Ear: External ear normal.     Left Ear: External ear normal.     Nose: Nose normal.     Mouth/Throat:     Mouth: Mucous membranes are moist.  Eyes:     Extraocular Movements: Extraocular movements intact.     Pupils: Pupils are equal, round, and reactive to light.  Cardiovascular:     Rate and Rhythm: Normal rate.     Pulses: Normal pulses.  Pulmonary:     Effort: Pulmonary effort is normal.  Abdominal:     General: Abdomen is flat.  Musculoskeletal:        General: Normal range of motion.     Cervical back: Normal range of motion.  Skin:    General: Skin is warm.     Capillary Refill: Capillary refill takes less than 2 seconds.  Neurological:     General: No focal  deficit present.     Mental Status: He is alert and oriented to person, place, and time.  Psychiatric:        Mood and Affect: Mood normal.     (all labs ordered are listed, but only abnormal results are displayed) Labs Reviewed  BASIC METABOLIC PANEL WITH GFR - Abnormal; Notable for the following components:      Result Value   Glucose, Bld 121 (*)    Creatinine, Ser 1.30 (*)    Calcium  8.8 (*)    All other components within normal limits  CBC - Abnormal; Notable for the following components:   RBC 6.29 (*)    Hemoglobin 18.9 (*)    MCHC 36.9 (*)    All other components within normal limits  TROPONIN T, HIGH SENSITIVITY - Abnormal; Notable for the following components:   Troponin T High Sensitivity 20 (*)    All other components within normal limits  PROTIME-INR    EKG: None  Radiology: CUP PACEART INCLINIC DEVICE CHECK Result Date: 04/03/2024 Normal in-clinic _single (VDD)__ chamber ICD check. Presenting Rhythm: __AS/VS, occ PVCs_ . Auto thresholds, sensing, and impedance demonstrate stable parameters and no programming changes needed.  Estimated longevity _MOS1___ . Pt enrolled in remote follow-up. pt with known (via clinic alert) treated VT episode EGM c/w PMVT treated successfully with HV therapy x1 see office note for full discussion on arrhythmia and management RU  Procedures   Medications Ordered in the ED - No data to display                                Medical Decision Making Risk Decision regarding hospitalization.   Patient is a ski 64-year-old male who presents today for evaluation of ICD firing with recent device interrogation showing increased ventricular arrhythmia burden.  On initial assessment patient was noted to be hemodynamically stable and afebrile.  On my bedside assessment patient was in to be resting comfortable without acute distress.  Patient without any active symptoms at this point in time.  Physical examination without any acute  abnormalities.  Laboratory evaluation ordered for further assessment.    EKG with evidence of underlying normal sinus rhythm with evidence of frequent ventricular ectopy.  Review of laboratory evaluation with elevated troponin of 20 otherwise unremarkable metabolic panel and CBC.  We did speak with cardiology here but will ultimately admit to the inpatient service for IV amiodarone  load and further monitoring here in the inpatient setting.   Final diagnoses:  V-tach [facility])  AICD discharge  ED Discharge Orders     None          Laurita Sieving, MD 04/04/24 2313    Ruthe Cornet, DO 04/04/24 2329  "

## 2024-04-05 ENCOUNTER — Encounter (HOSPITAL_COMMUNITY): Admission: RE | Payer: Self-pay | Source: Home / Self Care

## 2024-04-05 ENCOUNTER — Ambulatory Visit (HOSPITAL_COMMUNITY): Admission: RE | Admit: 2024-04-05 | Source: Home / Self Care | Admitting: Gastroenterology

## 2024-04-05 ENCOUNTER — Inpatient Hospital Stay (HOSPITAL_COMMUNITY)

## 2024-04-05 LAB — BASIC METABOLIC PANEL WITH GFR
Anion gap: 10 (ref 5–15)
BUN: 12 mg/dL (ref 8–23)
CO2: 22 mmol/L (ref 22–32)
Calcium: 8.5 mg/dL — ABNORMAL LOW (ref 8.9–10.3)
Chloride: 108 mmol/L (ref 98–111)
Creatinine, Ser: 1.24 mg/dL (ref 0.61–1.24)
GFR, Estimated: >60 mL/min
Glucose, Bld: 93 mg/dL (ref 70–99)
Potassium: 4 mmol/L (ref 3.5–5.1)
Sodium: 140 mmol/L (ref 135–145)

## 2024-04-05 LAB — PROTIME-INR
INR: 1.1 (ref 0.8–1.2)
Prothrombin Time: 15 s (ref 11.4–15.2)

## 2024-04-05 LAB — CBC
HCT: 48.3 % (ref 39.0–52.0)
Hemoglobin: 17.1 g/dL — ABNORMAL HIGH (ref 13.0–17.0)
MCH: 28.6 pg (ref 26.0–34.0)
MCHC: 35.4 g/dL (ref 30.0–36.0)
MCV: 80.9 fL (ref 80.0–100.0)
Platelets: 237 10*3/uL (ref 150–400)
RBC: 5.97 MIL/uL — ABNORMAL HIGH (ref 4.22–5.81)
RDW: 13.8 % (ref 11.5–15.5)
WBC: 6.9 10*3/uL (ref 4.0–10.5)
nRBC: 0 % (ref 0.0–0.2)

## 2024-04-05 LAB — ECHOCARDIOGRAM COMPLETE
AR max vel: 1.89 cm2
AV Area VTI: 1.96 cm2
AV Area mean vel: 1.82 cm2
AV Mean grad: 4 mmHg
AV Peak grad: 5.5 mmHg
Ao pk vel: 1.17 m/s
Area-P 1/2: 6.32 cm2
Height: 69 in
S' Lateral: 3.1 cm
Weight: 3504 [oz_av]

## 2024-04-05 LAB — MAGNESIUM: Magnesium: 2 mg/dL (ref 1.7–2.4)

## 2024-04-05 MED ORDER — ACETAMINOPHEN 325 MG PO TABS: 650.0000 mg | ORAL_TABLET | ORAL | Status: AC | PRN

## 2024-04-05 MED ORDER — ICOSAPENT ETHYL 1 G PO CAPS
2.0000 g | ORAL_CAPSULE | Freq: Two times a day (BID) | ORAL | Status: AC
  Administered 2024-04-05 (×2): 2 g via ORAL
  Filled 2024-04-05 (×2): qty 2

## 2024-04-05 MED ORDER — WARFARIN SODIUM 5 MG PO TABS
5.0000 mg | ORAL_TABLET | Freq: Once | ORAL | Status: AC
  Administered 2024-04-05: 5 mg via ORAL
  Filled 2024-04-05 (×2): qty 1

## 2024-04-05 MED ORDER — SACUBITRIL-VALSARTAN 97-103 MG PO TABS
1.0000 | ORAL_TABLET | Freq: Two times a day (BID) | ORAL | Status: AC
  Administered 2024-04-05 (×2): 1 via ORAL
  Filled 2024-04-05 (×2): qty 1

## 2024-04-05 MED ORDER — EMPAGLIFLOZIN 10 MG PO TABS
10.0000 mg | ORAL_TABLET | Freq: Every day | ORAL | Status: AC
  Administered 2024-04-05: 10 mg via ORAL
  Filled 2024-04-05: qty 1

## 2024-04-05 MED ORDER — CLOPIDOGREL BISULFATE 75 MG PO TABS
75.0000 mg | ORAL_TABLET | Freq: Every day | ORAL | Status: AC
  Administered 2024-04-05: 75 mg via ORAL
  Filled 2024-04-05: qty 1

## 2024-04-05 MED ORDER — MEXILETINE HCL 150 MG PO CAPS: 150.0000 mg | ORAL_CAPSULE | Freq: Two times a day (BID) | ORAL | Status: DC

## 2024-04-05 MED ORDER — ATORVASTATIN CALCIUM 80 MG PO TABS
80.0000 mg | ORAL_TABLET | Freq: Every evening | ORAL | Status: AC
  Administered 2024-04-05: 80 mg via ORAL
  Filled 2024-04-05: qty 2

## 2024-04-05 MED ORDER — SPIRONOLACTONE 25 MG PO TABS
25.0000 mg | ORAL_TABLET | Freq: Every day | ORAL | Status: AC
  Administered 2024-04-05: 25 mg via ORAL
  Filled 2024-04-05: qty 1

## 2024-04-05 MED ORDER — PERFLUTREN LIPID MICROSPHERE
1.0000 mL | INTRAVENOUS | Status: AC | PRN
  Administered 2024-04-05: 2 mL via INTRAVENOUS

## 2024-04-05 MED ORDER — SODIUM CHLORIDE 0.9% FLUSH: 3.0000 mL | INTRAVENOUS | Status: AC | PRN

## 2024-04-05 MED ORDER — SODIUM CHLORIDE 0.9 % IV SOLN: 250.0000 mL | INTRAVENOUS | Status: AC | PRN

## 2024-04-05 MED ORDER — METOPROLOL SUCCINATE ER 100 MG PO TB24
200.0000 mg | ORAL_TABLET | Freq: Every day | ORAL | Status: AC
  Administered 2024-04-05: 200 mg via ORAL
  Filled 2024-04-05: qty 8

## 2024-04-05 MED ORDER — ONDANSETRON HCL 4 MG/2ML IJ SOLN: 4.0000 mg | Freq: Four times a day (QID) | INTRAMUSCULAR | Status: AC | PRN

## 2024-04-05 MED ORDER — PANTOPRAZOLE SODIUM 40 MG PO TBEC
40.0000 mg | DELAYED_RELEASE_TABLET | Freq: Every day | ORAL | Status: AC
  Administered 2024-04-05: 40 mg via ORAL
  Filled 2024-04-05: qty 1

## 2024-04-05 MED ORDER — AMIODARONE HCL 200 MG PO TABS
400.0000 mg | ORAL_TABLET | Freq: Two times a day (BID) | ORAL | Status: AC
  Administered 2024-04-05: 400 mg via ORAL
  Filled 2024-04-05: qty 2

## 2024-04-05 MED ORDER — SODIUM CHLORIDE 0.9% FLUSH
3.0000 mL | Freq: Two times a day (BID) | INTRAVENOUS | Status: AC
  Administered 2024-04-05: 3 mL via INTRAVENOUS

## 2024-04-05 NOTE — ED Notes (Signed)
Patient is resting comfortably. Wife at bedside.

## 2024-04-05 NOTE — Progress Notes (Cosign Needed)
"  °  Patient Name: Caleb Thomas Date of Encounter: 04/05/2024  Primary Cardiologist: Wilbert Bihari, MD Electrophysiologist: Danelle Birmingham, MD  Interval Summary   NAEO. No further episodes. Did not get GDMT doses last night.   Vital Signs    Vitals:   04/05/24 0630 04/05/24 0645 04/05/24 0700 04/05/24 0701  BP: 105/66 116/86 125/84   Pulse: 69 77 69   Resp: 18 (!) 24 17   Temp:    97.7 F (36.5 C)  TempSrc:      SpO2: 98% 96% 99%   Weight:      Height:       No intake or output data in the 24 hours ending 04/05/24 0830 Filed Weights   04/04/24 1509  Weight: 99.3 kg    Physical Exam    GEN- NAD, Alert and oriented  Lungs- Clear to ausculation bilaterally, normal work of breathing Cardiac- Regular rate and rhythm, no murmurs, rubs or gallops GI- soft, NT, ND, + BS Extremities- no clubbing or cyanosis. No edema  Telemetry    NSR 60-70s, no further VT (personally reviewed)  Hospital Course    Caleb Thomas is a 64 y.o. male with a history of HTN, HLD, CAD (04/2018 with Anterior STEMI. Cath showed severe single-vessel CAD with occluded mid LAD and faint left-to-left collaterals, medical management), LV thrombus, chronic CHF (systolic), ICM, ICD who is being seen today for the evaluation of VT with ICD shock at the request of Dr. Ruthe.   Assessment & Plan    Recurrent ventricular arrhythmia  ICD shocks Chronic systolic CHF Echo 01/2021 LVEF 30-35% -> update. Continue GDMT as tolerated Updated labs pending this am Keep K > 4.0 and Mg > 2.0  Continue IV amiodarone  through today. To po this evening if remains stable.  Continue mexitil  for now, can consider stopping once amio loaded.    CAD No s/s of ischemia.     Case reviewed with gen cards MD in office 2/4 and not felt to need ischemic work up at this time with no anginal symptoms, known ICM, and akinetic WMA on his echos (unlikely to benefit from revasc)     H/o LV thrombus Was bridging with heparin  for  colonoscopy Warfarin dosing +/- bridge per pharmacy.   Potential Medical Readiness Date: 04/06/2024  if VT remains quiescent.   For questions or updates, please contact [facility] Please consult [email or web address] for contact info under     Signed, Ozell Prentice Passey, PA-C  04/05/2024, 8:30 AM     "

## 2024-04-05 NOTE — Progress Notes (Signed)
 ANTICOAGULATION CONSULT NOTE  Pharmacy Consult for Warfarin Indication: LV Thrombus  Allergies[1]  Patient Measurements: Height: 5' 9 (175.3 cm) Weight: 99.3 kg (219 lb) IBW/kg (Calculated) : 70.7  Vital Signs: Temp: 97.7 F (36.5 C) (02/06 0701) Temp Source: Oral (02/05 2354) BP: 125/84 (02/06 0700) Pulse Rate: 69 (02/06 0700)  Labs: Recent Labs    04/03/24 1611 04/04/24 1517 04/04/24 1806 04/05/24 0231  HGB  --  18.9*  --  17.1*  HCT  --  51.2  --  48.3  PLT  --  251  --  237  LABPROT  --   --  14.9 15.0  INR  --   --  1.1 1.1  CREATININE 1.33* 1.30*  --   --     Estimated Creatinine Clearance: 65.8 mL/min (A) (by C-G formula based on SCr of 1.3 mg/dL (H)).   Medical History: Past Medical History:  Diagnosis Date   CAD (coronary artery disease), native coronary artery 04/2018   s/p anterior STEMI with severe single-vessel CAD with occluded mid LAD and faint left-to-left collaterals.   Chronic systolic (congestive) heart failure (HCC)    Current use of long term anticoagulation    GERD (gastroesophageal reflux disease)    Hyperlipidemia    Hypertension 2015   ICD (implantable cardioverter-defibrillator) in place    Impaired fasting blood sugar    Ischemic cardiomyopathy    EF 25-30% by echo 01/2019 s/p AICD implant   LV (left ventricular) mural thrombus    noted on cardiac MRI but not on echo with contrast>>on chronic warfarin    Medications:  (Not in a hospital admission)  Scheduled:   enoxaparin  (LOVENOX ) injection  1 mg/kg Subcutaneous Q12H   mexiletine  150 mg Oral Q12H   Warfarin - Pharmacist Dosing Inpatient   Does not apply q1600   Infusions:   amiodarone  30 mg/hr (04/05/24 0644)   PRN:   Assessment: 63 yom with a history of HTN, HLD, CAD, STEMI, LV Thrombus, HF. Patient is presenting with VT. Warfarin per pharmacy consult placed for LV Thrombus.  Patient was initially being bridged with enoxaparin  for an upcoming procedure. This was  cancelled and warfarin resumed 2/4 outpatient. Home dose is 5 mg TuTh,Sa,Su; 2.5 mg MWF. Last taken 2/4 1830.  Patient's last enoxaparin  dose (100mg  q12h) was 2/5 0830.  INR today is 1.1, which is sub-therapeutic (Expected with recent restart)  Hgb 17.1; plt 237  Goal of Therapy:  INR Goal 2-3 Monitor platelets by anticoagulation protocol: Yes   Plan:  Warfarin: 5mg  warfarin today - repeat doses per INR Monitor for s/s of hemorrhage, daily INR, CBC Watch for new DDIs  Continue enoxaparin  100 mg q12h until INR therapeutic Monitor renal function  Rankin Sams, PharmD, BCCCP Clinical Pharmacist    [1] No Known Allergies

## 2024-04-09 ENCOUNTER — Ambulatory Visit (HOSPITAL_BASED_OUTPATIENT_CLINIC_OR_DEPARTMENT_OTHER): Admitting: Cardiology

## 2024-04-09 ENCOUNTER — Ambulatory Visit

## 2024-04-25 ENCOUNTER — Ambulatory Visit: Admitting: Physician Assistant

## 2024-05-03 ENCOUNTER — Ambulatory Visit (HOSPITAL_COMMUNITY)

## 2024-05-07 ENCOUNTER — Ambulatory Visit: Admitting: Cardiology

## 2024-06-18 ENCOUNTER — Ambulatory Visit: Payer: Self-pay

## 2024-06-27 ENCOUNTER — Ambulatory Visit: Payer: Self-pay | Admitting: Medical

## 2024-07-12 ENCOUNTER — Ambulatory Visit: Admitting: Cardiology
